# Patient Record
Sex: Female | Born: 1950
Health system: Southern US, Community
[De-identification: ages and names within clinical notes are randomized; demographics above are authoritative.]

## PROBLEM LIST (undated history)

## (undated) DIAGNOSIS — F32A Depression, unspecified: Secondary | ICD-10-CM

## (undated) DIAGNOSIS — F329 Major depressive disorder, single episode, unspecified: Secondary | ICD-10-CM

## (undated) DIAGNOSIS — G43909 Migraine, unspecified, not intractable, without status migrainosus: Secondary | ICD-10-CM

## (undated) DIAGNOSIS — R112 Nausea with vomiting, unspecified: Secondary | ICD-10-CM

## (undated) DIAGNOSIS — Z9889 Other specified postprocedural states: Secondary | ICD-10-CM

## (undated) DIAGNOSIS — G4700 Insomnia, unspecified: Secondary | ICD-10-CM

## (undated) DIAGNOSIS — K219 Gastro-esophageal reflux disease without esophagitis: Secondary | ICD-10-CM

## (undated) HISTORY — DX: Gastro-esophageal reflux disease without esophagitis: K21.9

## (undated) HISTORY — DX: Insomnia, unspecified: G47.00

## (undated) HISTORY — DX: Depression, unspecified: F32.A

---
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
## 1974-06-11 HISTORY — PX: BREAST ENHANCEMENT SURGERY: SHX7

## 1974-06-11 HISTORY — PX: AUGMENTATION MAMMAPLASTY: SUR837

## 2000-06-11 HISTORY — PX: CATARACT EXTRACTION: SUR2

## 2007-05-07 ENCOUNTER — Ambulatory Visit: Payer: Self-pay | Admitting: Emergency Medicine

## 2007-05-08 ENCOUNTER — Ambulatory Visit: Payer: Self-pay | Admitting: Emergency Medicine

## 2010-01-09 ENCOUNTER — Ambulatory Visit: Payer: Self-pay | Admitting: Internal Medicine

## 2011-02-15 ENCOUNTER — Ambulatory Visit: Payer: Self-pay | Admitting: Internal Medicine

## 2011-06-12 LAB — HM DEXA SCAN

## 2012-02-13 LAB — HM PAP SMEAR: HM PAP: NORMAL

## 2012-03-12 ENCOUNTER — Ambulatory Visit: Payer: Self-pay | Admitting: Internal Medicine

## 2012-03-13 ENCOUNTER — Ambulatory Visit: Payer: Self-pay | Admitting: Internal Medicine

## 2013-03-17 ENCOUNTER — Ambulatory Visit: Payer: Self-pay | Admitting: Internal Medicine

## 2013-12-24 ENCOUNTER — Ambulatory Visit: Payer: Self-pay | Admitting: Emergency Medicine

## 2014-04-21 LAB — TSH: TSH: 1.6 u[IU]/mL (ref <=5.90)

## 2014-04-21 LAB — BASIC METABOLIC PANEL
BUN: 11 mg/dL (ref 4–21)
Creatinine: 1 mg/dL (ref <=1.1)

## 2014-04-21 LAB — LIPID PANEL
CHOLESTEROL: 199 mg/dL (ref 0–200)
HDL: 98 mg/dL — AB (ref 35–70)
LDL CALC: 81 mg/dL
TRIGLYCERIDES: 100 mg/dL (ref 40–160)

## 2014-04-21 LAB — CBC AND DIFFERENTIAL: HEMOGLOBIN: 13.8 g/dL (ref 12.0–16.0)

## 2014-05-26 ENCOUNTER — Ambulatory Visit: Payer: Self-pay | Admitting: Internal Medicine

## 2014-06-09 ENCOUNTER — Ambulatory Visit: Payer: Self-pay | Admitting: Internal Medicine

## 2014-06-09 LAB — HM MAMMOGRAPHY: HM Mammogram: NORMAL

## 2015-02-22 ENCOUNTER — Other Ambulatory Visit: Payer: Self-pay | Admitting: Internal Medicine

## 2015-02-23 ENCOUNTER — Other Ambulatory Visit: Payer: Self-pay | Admitting: Internal Medicine

## 2015-02-23 MED ORDER — ZOLPIDEM TARTRATE 10 MG PO TABS: 10.0000 mg | ORAL_TABLET | Freq: Every evening | ORAL | Status: DC | PRN

## 2015-03-28 ENCOUNTER — Encounter: Payer: Self-pay | Admitting: Internal Medicine

## 2015-03-28 DIAGNOSIS — M810 Age-related osteoporosis without current pathological fracture: Secondary | ICD-10-CM | POA: Insufficient documentation

## 2015-03-28 DIAGNOSIS — G4700 Insomnia, unspecified: Secondary | ICD-10-CM | POA: Insufficient documentation

## 2015-03-28 DIAGNOSIS — F419 Anxiety disorder, unspecified: Secondary | ICD-10-CM | POA: Insufficient documentation

## 2015-03-28 DIAGNOSIS — F332 Major depressive disorder, recurrent severe without psychotic features: Secondary | ICD-10-CM | POA: Insufficient documentation

## 2015-03-28 DIAGNOSIS — K219 Gastro-esophageal reflux disease without esophagitis: Secondary | ICD-10-CM | POA: Insufficient documentation

## 2015-03-28 DIAGNOSIS — G43009 Migraine without aura, not intractable, without status migrainosus: Secondary | ICD-10-CM | POA: Insufficient documentation

## 2015-03-29 ENCOUNTER — Encounter: Payer: Self-pay | Admitting: Internal Medicine

## 2015-03-29 ENCOUNTER — Ambulatory Visit (INDEPENDENT_AMBULATORY_CARE_PROVIDER_SITE_OTHER): Payer: Federal, State, Local not specified - PPO | Admitting: Internal Medicine

## 2015-03-29 VITALS — BP 110/68 | HR 114 | Temp 98.3°F | Ht 67.0 in | Wt 121.6 lb

## 2015-03-29 DIAGNOSIS — J40 Bronchitis, not specified as acute or chronic: Secondary | ICD-10-CM

## 2015-03-29 MED ORDER — ALBUTEROL SULFATE HFA 108 (90 BASE) MCG/ACT IN AERS: 2.0000 | INHALATION_SPRAY | Freq: Four times a day (QID) | RESPIRATORY_TRACT | Status: DC | PRN

## 2015-03-29 MED ORDER — HYDROCODONE-HOMATROPINE 5-1.5 MG/5ML PO SYRP: 5.0000 mL | ORAL_SOLUTION | Freq: Four times a day (QID) | ORAL | Status: DC | PRN

## 2015-03-29 MED ORDER — CEFDINIR 300 MG PO CAPS: 300.0000 mg | ORAL_CAPSULE | Freq: Two times a day (BID) | ORAL | Status: DC

## 2015-03-29 NOTE — Progress Notes (Signed)
Date:  03/29/2015   Name:  Carolyn Dunlap   DOB:  Jun 21, 1950   MRN:  [id number]   Chief Complaint: Bronchitis Patient started a week ago with chest tightness and a dry cough. This progressed to sinus congestion and postnasal drainage with production of thick yellow mucus. She may have had low-grade fever. Some mild wheezing. She is taking an over-the-counter cough syrup and a multisymptom cold reliever. She denies headache or dizziness although she has chronic tinnitus. She's had no vomiting diarrhea or vertigo.   Review of Systems  Constitutional: Positive for fever and fatigue. Negative for diaphoresis.  HENT: Positive for congestion, postnasal drip, rhinorrhea, sinus pressure and tinnitus. Negative for ear pain, nosebleeds, sneezing and sore throat.   Respiratory: Positive for cough, chest tightness, shortness of breath and wheezing.   Cardiovascular: Negative for chest pain and palpitations.  Gastrointestinal: Negative for vomiting, abdominal pain and diarrhea.  Neurological: Negative for light-headedness and headaches.    Patient Active Problem List   Diagnosis Date Noted  . Migraine without aura and without status migrainosus, not intractable 03/28/2015  . GERD without esophagitis 03/28/2015  . Osteoporosis 03/28/2015  . Insomnia 03/28/2015  . Major depression in partial remission ([place]) 03/28/2015  . Anxiety disorder 03/28/2015    Prior to Admission medications   Medication Sig Start Date End Date Taking? Authorizing Provider  escitalopram (LEXAPRO) 20 MG tablet TAKE 1 TABLET DAILY 02/22/15  Yes Glean Hess, MD  SUMAtriptan (IMITREX) 100 MG tablet TAKE 1 TABLET DAILY AS     DIRECTED BY YOUR DOCTOR 02/22/15  Yes Glean Hess, MD  traZODone (DESYREL) 50 MG tablet TAKE 1 TABLET AT BEDTIME 02/22/15  Yes Glean Hess, MD  zolpidem (AMBIEN) 10 MG tablet Take 1 tablet (10 mg total) by mouth at bedtime as needed for sleep. 02/23/15 03/25/15  Glean Hess, MD    Allergies   Allergen Reactions  . Zoloft [Sertraline Hcl]     diaphoresis  . Penicillins Rash    Past Surgical History  Procedure Laterality Date  . Cataract extraction  2002  . Breast enhancement surgery  1976  . Oophorectomy  1974    Social History  Substance Use Topics  . Smoking status: Current Every Day Smoker  . Smokeless tobacco: None  . Alcohol Use: 1.2 oz/week    2 Standard drinks or equivalent per week    Medication list has been reviewed and updated.   Physical Exam  Constitutional: She appears well-developed. She has a sickly appearance.  HENT:  Right Ear: Ear canal normal. Tympanic membrane is scarred.  Left Ear: Ear canal normal. Tympanic membrane is scarred.  Nose: Right sinus exhibits maxillary sinus tenderness. Right sinus exhibits no frontal sinus tenderness. Left sinus exhibits maxillary sinus tenderness. Left sinus exhibits no frontal sinus tenderness.  Mouth/Throat: Posterior oropharyngeal erythema present. No oropharyngeal exudate or posterior oropharyngeal edema.  Cardiovascular: Normal rate, regular rhythm and normal heart sounds.   Pulmonary/Chest: No accessory muscle usage. No respiratory distress. She has wheezes in the right upper field, the right lower field and the left lower field. She has no rhonchi.  Nursing note and vitals reviewed.   BP 110/68 mmHg  Pulse 114  Temp(Src) 98.3 F (36.8 C)  Ht 5\' 7"  (1.702 m)  Wt 121 lb 9.6 oz (55.157 kg)  BMI 19.04 kg/m2  SpO2 95%  Assessment and Plan: 1. Bronchitis Patient to begin Claritin or Allegra daily Continue Robitussin cough syrup - HYDROcodone-homatropine (HYCODAN) 5-1.5 MG/5ML  syrup; Take 5 mLs by mouth every 6 (six) hours as needed for cough.  Dispense: 120 mL; Refill: 0 - cefdinir (OMNICEF) 300 MG capsule; Take 1 capsule (300 mg total) by mouth 2 (two) times daily.  Dispense: 20 capsule; Refill: 0 - albuterol (PROVENTIL HFA;VENTOLIN HFA) 108 (90 BASE) MCG/ACT inhaler; Inhale 2 puffs into the lungs  every 6 (six) hours as needed for wheezing or shortness of breath.  Dispense: 1 Inhaler; Refill: 0   Halina Maidens, MD [facility]  03/29/2015

## 2015-03-29 NOTE — Patient Instructions (Signed)
Take Allegra or Claritin for post nasal drainage  Robitussin DM - cough syrup and expectorant  Use pro-air inhaler - 2 puffs every 4 hours if needed for chest tightness/wheezing

## 2015-05-26 ENCOUNTER — Ambulatory Visit (INDEPENDENT_AMBULATORY_CARE_PROVIDER_SITE_OTHER): Payer: Federal, State, Local not specified - PPO | Admitting: Internal Medicine

## 2015-05-26 ENCOUNTER — Encounter: Payer: Self-pay | Admitting: Internal Medicine

## 2015-05-26 VITALS — BP 128/80 | HR 88 | Ht 67.0 in | Wt 122.8 lb

## 2015-05-26 DIAGNOSIS — Z0000 Encounter for general adult medical examination without abnormal findings: Secondary | ICD-10-CM

## 2015-05-26 DIAGNOSIS — G43009 Migraine without aura, not intractable, without status migrainosus: Secondary | ICD-10-CM

## 2015-05-26 DIAGNOSIS — F324 Major depressive disorder, single episode, in partial remission: Secondary | ICD-10-CM | POA: Diagnosis not present

## 2015-05-26 DIAGNOSIS — Z1239 Encounter for other screening for malignant neoplasm of breast: Secondary | ICD-10-CM

## 2015-05-26 DIAGNOSIS — K219 Gastro-esophageal reflux disease without esophagitis: Secondary | ICD-10-CM | POA: Diagnosis not present

## 2015-05-26 DIAGNOSIS — M810 Age-related osteoporosis without current pathological fracture: Secondary | ICD-10-CM | POA: Diagnosis not present

## 2015-05-26 DIAGNOSIS — Z23 Encounter for immunization: Secondary | ICD-10-CM

## 2015-05-26 DIAGNOSIS — Z124 Encounter for screening for malignant neoplasm of cervix: Secondary | ICD-10-CM | POA: Diagnosis not present

## 2015-05-26 LAB — POCT URINALYSIS DIPSTICK
BILIRUBIN UA: NEGATIVE
Blood, UA: NEGATIVE
GLUCOSE UA: NEGATIVE
KETONES UA: NEGATIVE
LEUKOCYTES UA: NEGATIVE
Nitrite, UA: NEGATIVE
PH UA: 5
Protein, UA: NEGATIVE
Spec Grav, UA: 1.01
Urobilinogen, UA: 0.2

## 2015-05-26 MED ORDER — ESCITALOPRAM OXALATE 20 MG PO TABS: 30.0000 mg | ORAL_TABLET | Freq: Every day | ORAL | Status: DC

## 2015-05-26 MED ORDER — ZOLPIDEM TARTRATE 10 MG PO TABS: 10.0000 mg | ORAL_TABLET | Freq: Every evening | ORAL | Status: DC | PRN

## 2015-05-26 NOTE — Patient Instructions (Addendum)
Pneumococcal Conjugate Vaccine (PCV13)  1. Why get vaccinated? Vaccination can protect both children and adults from pneumococcal disease. Pneumococcal disease is caused by bacteria that can spread from person to person through close contact. It can cause ear infections, and it can also lead to more serious infections of the:  Lungs (pneumonia),  Blood (bacteremia), and  Covering of the brain and spinal cord (meningitis). Pneumococcal pneumonia is most common among adults. Pneumococcal meningitis can cause deafness and brain damage, and it kills about 1 child in 10 who get it. Anyone can get pneumococcal disease, but children under 28 years of age and adults 43 years and older, people with certain medical conditions, and cigarette smokers are at the highest risk. Before there was a vaccine, the [place] saw:  more than 700 cases of meningitis,  about 13,000 blood infections,  about 5 million ear infections, and  about 200 deaths in children under 5 each year from pneumococcal disease. Since vaccine became available, severe pneumococcal disease in these children has fallen by 88%. About 18,000 older adults die of pneumococcal disease each year in the [place]. Treatment of pneumococcal infections with penicillin and other drugs is not as effective as it used to be, because some strains of the disease have become resistant to these drugs. This makes prevention of the disease, through vaccination, even more important. 2. PCV13 vaccine Pneumococcal conjugate vaccine (called PCV13) protects against 13 types of pneumococcal bacteria. PCV13 is routinely given to children at 2, 4, 6, and 65-74 months of age. It is also recommended for children and adults 70 to 70 years of age with certain health conditions, and for all adults 64 years of age and older. Your doctor can give you details. 3. Some people should not get this vaccine Anyone who has ever had a life-threatening allergic reaction  to a dose of this vaccine, to an earlier pneumococcal vaccine called PCV7, or to any vaccine containing diphtheria toxoid (for example, DTaP), should not get PCV13. Anyone with a severe allergy to any component of PCV13 should not get the vaccine. Tell your doctor if the person being vaccinated has any severe allergies. If the person scheduled for vaccination is not feeling well, your healthcare provider might decide to reschedule the shot on another day. 4. Risks of a vaccine reaction With any medicine, including vaccines, there is a chance of reactions. These are usually mild and go away on their own, but serious reactions are also possible. Problems reported following PCV13 varied by age and dose in the series. The most common problems reported among children were:  About half became drowsy after the shot, had a temporary loss of appetite, or had redness or tenderness where the shot was given.  About 1 out of 3 had swelling where the shot was given.  About 1 out of 3 had a mild fever, and about 1 in 20 had a fever over 102.55F.  Up to about 8 out of 10 became fussy or irritable. Adults have reported pain, redness, and swelling where the shot was given; also mild fever, fatigue, headache, chills, or muscle pain. Young children who get PCV13 along with inactivated flu vaccine at the same time may be at increased risk for seizures caused by fever. Ask your doctor for more information. Problems that could happen after any vaccine:  People sometimes faint after a medical procedure, including vaccination. Sitting or lying down for about 15 minutes can help prevent fainting, and injuries caused by a fall.  Tell your doctor if you feel dizzy, or have vision changes or ringing in the ears.  Some older children and adults get severe pain in the shoulder and have difficulty moving the arm where a shot was given. This happens very rarely.  Any medication can cause a severe allergic reaction. Such  reactions from a vaccine are very rare, estimated at about 1 in a million doses, and would happen within a few minutes to a few hours after the vaccination. As with any medicine, there is a very small chance of a vaccine causing a serious injury or death. The safety of vaccines is always being monitored. For more information, visit: http://www.aguilar.org/ 5. What if there is a serious reaction? What should I look for?  Look for anything that concerns you, such as signs of a severe allergic reaction, very high fever, or unusual behavior. Signs of a severe allergic reaction can include hives, swelling of the face and throat, difficulty breathing, a fast heartbeat, dizziness, and weakness-usually within a few minutes to a few hours after the vaccination. What should I do?  If you think it is a severe allergic reaction or other emergency that can't wait, call 9-1-1 or get the person to the nearest hospital. Otherwise, call your doctor. Reactions should be reported to the Vaccine Adverse Event Reporting System (VAERS). Your doctor should file this report, or you can do it yourself through the VAERS web site at www.vaers.SamedayNews.es, or by calling 249-618-7204. VAERS does not give medical advice. 6. The National Vaccine Injury Compensation Program The [employer] Vaccine Injury Compensation Program (VICP) is a federal program that was created to compensate people who may have been injured by certain vaccines. Persons who believe they may have been injured by a vaccine can learn about the program and about filing a claim by calling (425)392-6800 or visiting the [place] website at GoldCloset.com.ee. There is a time limit to file a claim for compensation. 7. How can I learn more?  Ask your healthcare provider. He or she can give you the vaccine package insert or suggest other sources of information.  Call your local or state health department.  Contact the Centers for Disease Control and  Prevention (CDC):  Call (606)180-8535 (1-800-CDC-INFO) or Visit CDC's website at https://stone.com/ Self-Awareness Practicing breast self-awareness may pick up problems early, prevent significant medical complications, and possibly save your life. By practicing breast self-awareness, you can become familiar with how your breasts look and feel and if your breasts are changing. This allows you to notice changes early. It can also offer you some reassurance that your breast health is good. One way to learn what is normal for your breasts and whether your breasts are changing is to do a breast self-exam. If you find a lump or something that was not present in the past, it is best to contact your caregiver right away. Other findings that should be evaluated by your caregiver include nipple discharge, especially if it is bloody; skin changes or reddening; areas where the skin seems to be pulled in (retracted); or new lumps and bumps. Breast pain is seldom associated with cancer (malignancy), but should also be evaluated by a caregiver. HOW TO PERFORM A BREAST SELF-EXAM The best time to examine your breasts is 5-7 days after your menstrual period is over. During menstruation, the breasts are lumpier, and it may be more difficult to pick up changes. If you do not menstruate, have reached menopause, or had your uterus removed (hysterectomy), you should examine your  breasts at regular intervals, such as monthly. If you are breastfeeding, examine your breasts after a feeding or after using a breast pump. Breast implants do not decrease the risk for lumps or tumors, so continue to perform breast self-exams as recommended. Talk to your caregiver about how to determine the difference between the implant and breast tissue. Also, talk about the amount of pressure you should use during the exam. Over time, you will become more familiar with the variations of your breasts and more comfortable with the exam. A breast  self-exam requires you to remove all your clothes above the waist. Look at your breasts and nipples. Stand in front of a mirror in a room with good lighting. With your hands on your hips, push your hands firmly downward. Look for a difference in shape, contour, and size from one breast to the other (asymmetry). Asymmetry includes puckers, dips, or bumps. Also, look for skin changes, such as reddened or scaly areas on the breasts. Look for nipple changes, such as discharge, dimpling, repositioning, or redness. Carefully feel your breasts. This is best done either in the shower or tub while using soapy water or when flat on your back. Place the arm (on the side of the breast you are examining) above your head. Use the pads (not the fingertips) of your three middle fingers on your opposite hand to feel your breasts. Start in the underarm area and use  inch (2 cm) overlapping circles to feel your breast. Use 3 different levels of pressure (light, medium, and firm pressure) at each circle before moving to the next circle. The light pressure is needed to feel the tissue closest to the skin. The medium pressure will help to feel breast tissue a little deeper, while the firm pressure is needed to feel the tissue close to the ribs. Continue the overlapping circles, moving downward over the breast until you feel your ribs below your breast. Then, move one finger-width towards the center of the body. Continue to use the  inch (2 cm) overlapping circles to feel your breast as you move slowly up toward the collar bone (clavicle) near the base of the neck. Continue the up and down exam using all 3 pressures until you reach the middle of the chest. Do this with each breast, carefully feeling for lumps or changes.  Keep a written record with breast changes or normal findings for each breast. By writing this information down, you do not need to depend only on memory for size, tenderness, or location. Write down where you are  in your menstrual cycle, if you are still menstruating. Breast tissue can have some lumps or thick tissue. However, see your caregiver if you find anything that concerns you.  SEEK MEDICAL CARE IF: You see a change in shape, contour, or size of your breasts or nipples.  You see skin changes, such as reddened or scaly areas on the breasts or nipples.  You have an unusual discharge from your nipples.  You feel a new lump or unusually thick areas.    This information is not intended to replace advice given to you by your health care provider. Make sure you discuss any questions you have with your health care provider.   Document Released: 05/28/2005 Document Revised: 05/14/2012 Document Reviewed: 09/12/2011 Elsevier Interactive Patient Education 2016 [place] Statement PCV13 Vaccine (04/15/2014)   This information is not intended to replace advice given to you by your health care provider. Make sure  you discuss any questions you have with your health care provider.   Document Released: 03/25/2006 Document Revised: 06/18/2014 Document Reviewed: 04/22/2014 Elsevier Interactive Patient Education [employer].

## 2015-05-26 NOTE — Progress Notes (Signed)
Date:  05/26/2015   Name:  Carolyn Dunlap   DOB:  1950-08-13   MRN:  [id number]   Chief Complaint: Annual Exam Depression        This is a chronic problem.  The problem occurs intermittently.  The problem has been waxing and waning since onset.  Associated symptoms include hopelessness (related to issues with her adult son), insomnia, decreased interest and headaches.  Associated symptoms include no fatigue and no helplessness.  Past treatments include SSRIs - Selective serotonin reuptake inhibitors.  Compliance with treatment is good.  Previous treatment provided moderate relief. Migraine  This is a recurrent problem. The current episode started more than 1 year ago. The problem has been waxing and waning. Associated symptoms include insomnia. Pertinent negatives include no abdominal pain, coughing, dizziness or fever.  Carolyn Dunlap is a 64 y.o. female who presents today for her Complete Annual Exam. She feels well. She reports exercising walking. She reports she is sleeping well. She has cut back smoking to 1/4 ppd. She denies breast problems but is due for mammogram. Her last Pap smear was 3 years ago.   Review of Systems  Constitutional: Negative for fever, chills and fatigue.  Eyes: Negative for visual disturbance.  Respiratory: Negative for cough, chest tightness, shortness of breath and wheezing.   Cardiovascular: Negative for chest pain, palpitations and leg swelling.  Gastrointestinal: Negative for abdominal pain and blood in stool.  Genitourinary: Negative for dysuria, hematuria, decreased urine volume, vaginal bleeding and vaginal discharge.  Musculoskeletal: Negative for arthralgias and gait problem.  Skin: Negative for rash.  Neurological: Positive for headaches. Negative for dizziness, tremors, syncope and light-headedness.  Hematological: Negative for adenopathy.  Psychiatric/Behavioral: Positive for depression, sleep disturbance and dysphoric mood. Negative for confusion.  The patient is nervous/anxious and has insomnia.     Patient Active Problem List   Diagnosis Date Noted  . Migraine without aura and without status migrainosus, not intractable 03/28/2015  . GERD without esophagitis 03/28/2015  . Osteoporosis 03/28/2015  . Insomnia 03/28/2015  . Major depression in partial remission ([place]) 03/28/2015  . Anxiety disorder 03/28/2015    Prior to Admission medications   Medication Sig Start Date End Date Taking? Authorizing Provider  escitalopram (LEXAPRO) 20 MG tablet TAKE 1 TABLET DAILY 02/22/15  Yes Glean Hess, MD  SUMAtriptan (IMITREX) 100 MG tablet TAKE 1 TABLET DAILY AS     DIRECTED BY YOUR DOCTOR 02/22/15  Yes Glean Hess, MD  traZODone (DESYREL) 50 MG tablet TAKE 1 TABLET AT BEDTIME 02/22/15  Yes Glean Hess, MD  zolpidem (AMBIEN) 10 MG tablet Take 1 tablet (10 mg total) by mouth at bedtime as needed for sleep. 02/23/15 03/25/15  Glean Hess, MD    Allergies  Allergen Reactions  . Zoloft [Sertraline Hcl]     diaphoresis  . Penicillins Rash    Past Surgical History  Procedure Laterality Date  . Cataract extraction  2002  . Breast enhancement surgery  1976  . Oophorectomy  1974    Social History  Substance Use Topics  . Smoking status: Current Every Day Smoker  . Smokeless tobacco: None  . Alcohol Use: 1.2 oz/week    2 Standard drinks or equivalent per week    Medication list has been reviewed and updated.   Physical Exam  Constitutional: She is oriented to person, place, and time. She appears well-developed and well-nourished. No distress.  HENT:  Head: Normocephalic and atraumatic.  Right Ear: Tympanic membrane  and ear canal normal.  Left Ear: Tympanic membrane and ear canal normal.  Nose: Right sinus exhibits no maxillary sinus tenderness. Left sinus exhibits no maxillary sinus tenderness.  Mouth/Throat: Uvula is midline and oropharynx is clear and moist.  Eyes: Conjunctivae and EOM are normal. Right eye  exhibits no discharge. Left eye exhibits no discharge. No scleral icterus.  Neck: Normal range of motion. Carotid bruit is not present. No erythema present. No thyromegaly present.  Cardiovascular: Normal rate, regular rhythm, normal heart sounds and normal pulses.   Pulmonary/Chest: Effort normal. No respiratory distress. She has no wheezes. Right breast exhibits no mass, no nipple discharge, no skin change and no tenderness. Left breast exhibits no mass, no nipple discharge, no skin change and no tenderness.  Bilateral breast implants - firm, left implant shifted laterally; no tenderness  Abdominal: Soft. Bowel sounds are normal. There is no hepatosplenomegaly. There is no tenderness. There is no CVA tenderness.  Genitourinary: Rectum normal, vagina normal and uterus normal. There is no rash, tenderness or lesion on the right labia. There is no rash, tenderness or lesion on the left labia. Cervix exhibits no motion tenderness, no discharge and no friability. Right adnexum displays no mass, no tenderness and no fullness. Left adnexum displays no mass, no tenderness and no fullness.  Musculoskeletal: Normal range of motion.  Lymphadenopathy:    She has no cervical adenopathy.    She has no axillary adenopathy.  Neurological: She is alert and oriented to person, place, and time. She has normal reflexes. No cranial nerve deficit or sensory deficit.  Skin: Skin is warm, dry and intact. No rash noted.  Psychiatric: Her speech is normal and behavior is normal. Thought content normal. She exhibits a depressed mood.  Nursing note and vitals reviewed.   BP 128/80 mmHg  Pulse 88  Ht 5\' 7"  (1.702 m)  Wt 122 lb 12.8 oz (55.702 kg)  BMI 19.23 kg/m2  Assessment and Plan: 1. Annual physical exam Tobacco cessation discussed - pt is cutting back with a goal to eventually quit completely - POCT urinalysis dipstick - Hepatitis C antibody - HIV antibody - Lipid panel - TSH  2. Breast cancer screening -  MM DIGITAL SCREENING BILATERAL; Future  3. Pap smear for cervical cancer screening - Pap IG and HPV (high risk) DNA detection  4. Migraine without aura and without status migrainosus, not intractable Continue Excedrin migraine or Imitrex for more severe headache - Comprehensive metabolic panel  5. Major depressive disorder with single episode, in partial remission (HCC) Will increase dose on Lexapro to 30 mg Consider adding Welbutrin if needed - escitalopram (LEXAPRO) 20 MG tablet; Take 1.5 tablets (30 mg total) by mouth daily.  Dispense: 135 tablet; Refill: 3 - TSH  6. GERD without esophagitis stable - CBC with Differential/Platelet  7. Need for pneumococcal vaccination Due to long term smoking history - Pneumococcal conjugate vaccine 13-valent IM  8. Osteoporosis On Bisphosphonate holiday for the past year - DG Bone Density; Future   Halina Maidens, MD [facility]  05/26/2015

## 2015-05-27 LAB — COMPREHENSIVE METABOLIC PANEL
ALT: 15 IU/L (ref 0–32)
AST: 20 IU/L (ref 0–40)
Albumin/Globulin Ratio: 1.6 (ref 1.1–2.5)
Albumin: 4.3 g/dL (ref 3.6–4.8)
Alkaline Phosphatase: 95 IU/L (ref 39–117)
BUN/Creatinine Ratio: 11 (ref 11–26)
BUN: 10 mg/dL (ref 8–27)
Bilirubin Total: 0.4 mg/dL (ref 0.0–1.2)
CALCIUM: 9.3 mg/dL (ref 8.7–10.3)
CO2: 23 mmol/L (ref 18–29)
CREATININE: 0.89 mg/dL (ref 0.57–1.00)
Chloride: 95 mmol/L — ABNORMAL LOW (ref 96–106)
GFR, EST AFRICAN AMERICAN: 80 mL/min/{1.73_m2} (ref >59)
GFR, EST NON AFRICAN AMERICAN: 70 mL/min/{1.73_m2} (ref >59)
GLOBULIN, TOTAL: 2.7 g/dL (ref 1.5–4.5)
Glucose: 106 mg/dL — ABNORMAL HIGH (ref 65–99)
Potassium: 4.3 mmol/L (ref 3.5–5.2)
Sodium: 135 mmol/L (ref 134–144)
TOTAL PROTEIN: 7 g/dL (ref 6.0–8.5)

## 2015-05-27 LAB — CBC WITH DIFFERENTIAL/PLATELET
BASOS: 0 %
Basophils Absolute: 0 10*3/uL (ref 0.0–0.2)
EOS (ABSOLUTE): 0.1 10*3/uL (ref 0.0–0.4)
EOS: 0 %
HEMATOCRIT: 38.7 % (ref 34.0–46.6)
HEMOGLOBIN: 12.9 g/dL (ref 11.1–15.9)
IMMATURE GRANS (ABS): 0 10*3/uL (ref 0.0–0.1)
IMMATURE GRANULOCYTES: 0 %
LYMPHS: 8 %
Lymphocytes Absolute: 0.9 10*3/uL (ref 0.7–3.1)
MCH: 30.6 pg (ref 26.6–33.0)
MCHC: 33.3 g/dL (ref 31.5–35.7)
MCV: 92 fL (ref 79–97)
Monocytes Absolute: 0.5 10*3/uL (ref 0.1–0.9)
Monocytes: 4 %
NEUTROS ABS: 9.7 10*3/uL — AB (ref 1.4–7.0)
NEUTROS PCT: 88 %
Platelets: 295 10*3/uL (ref 150–379)
RBC: 4.21 x10E6/uL (ref 3.77–5.28)
RDW: 15.4 % (ref 12.3–15.4)
WBC: 11.3 10*3/uL — ABNORMAL HIGH (ref 3.4–10.8)

## 2015-05-27 LAB — LIPID PANEL
CHOL/HDL RATIO: 2.3 ratio (ref 0.0–4.4)
CHOLESTEROL TOTAL: 181 mg/dL (ref 100–199)
HDL: 79 mg/dL (ref >39)
LDL CALC: 82 mg/dL (ref 0–99)
TRIGLYCERIDES: 99 mg/dL (ref 0–149)
VLDL CHOLESTEROL CAL: 20 mg/dL (ref 5–40)

## 2015-05-27 LAB — HIV ANTIBODY (ROUTINE TESTING W REFLEX): HIV Screen 4th Generation wRfx: NONREACTIVE

## 2015-05-27 LAB — HEPATITIS C ANTIBODY: Hep C Virus Ab: <0.1 s/co ratio (ref 0.0–0.9)

## 2015-05-27 LAB — TSH: TSH: 1.78 u[IU]/mL (ref 0.450–4.500)

## 2015-06-02 ENCOUNTER — Ambulatory Visit
Admission: RE | Admit: 2015-06-02 | Discharge: 2015-06-02 | Disposition: A | Discharge status: Home / Self Care | Payer: Federal, State, Local not specified - PPO | Source: Ambulatory Visit | Attending: Internal Medicine | Admitting: Internal Medicine

## 2015-06-02 ENCOUNTER — Other Ambulatory Visit: Payer: Self-pay | Admitting: Internal Medicine

## 2015-06-02 DIAGNOSIS — Z1239 Encounter for other screening for malignant neoplasm of breast: Secondary | ICD-10-CM

## 2015-06-02 DIAGNOSIS — Z1231 Encounter for screening mammogram for malignant neoplasm of breast: Secondary | ICD-10-CM | POA: Insufficient documentation

## 2015-06-02 DIAGNOSIS — M810 Age-related osteoporosis without current pathological fracture: Secondary | ICD-10-CM | POA: Diagnosis present

## 2015-06-03 LAB — PAP IG AND HPV HIGH-RISK
HPV, HIGH-RISK: NEGATIVE
PAP SMEAR COMMENT: 0

## 2015-06-23 LAB — SPECIMEN STATUS REPORT

## 2015-06-23 LAB — HGB A1C W/O EAG: HEMOGLOBIN A1C: 5.4 % (ref 4.8–5.6)

## 2015-07-01 ENCOUNTER — Encounter: Payer: Self-pay | Admitting: Internal Medicine

## 2015-07-01 ENCOUNTER — Ambulatory Visit (INDEPENDENT_AMBULATORY_CARE_PROVIDER_SITE_OTHER): Payer: Federal, State, Local not specified - PPO | Admitting: Internal Medicine

## 2015-07-01 VITALS — BP 132/80 | HR 96 | Temp 98.1°F | Resp 16 | Ht 66.5 in | Wt 119.0 lb

## 2015-07-01 DIAGNOSIS — D72829 Elevated white blood cell count, unspecified: Secondary | ICD-10-CM

## 2015-07-01 NOTE — Progress Notes (Signed)
    Date:  07/01/2015   Name:  Carolyn Dunlap   DOB:  Nov 16, 1950   MRN:  [id number]   Chief Complaint: Depression Depression           Review of Systems  Psychiatric/Behavioral: Positive for depression.    Patient Active Problem List   Diagnosis Date Noted  . Migraine without aura and without status migrainosus, not intractable 03/28/2015  . GERD without esophagitis 03/28/2015  . Osteoporosis 03/28/2015  . Insomnia 03/28/2015  . Major depression in partial remission ([place]) 03/28/2015  . Anxiety disorder 03/28/2015    Prior to Admission medications   Medication Sig Start Date End Date Taking? Authorizing Provider  escitalopram (LEXAPRO) 20 MG tablet Take 1.5 tablets (30 mg total) by mouth daily. 05/26/15  Yes Glean Hess, MD  SUMAtriptan (IMITREX) 100 MG tablet TAKE 1 TABLET DAILY AS     DIRECTED BY YOUR DOCTOR 02/22/15  Yes Glean Hess, MD  traZODone (DESYREL) 50 MG tablet TAKE 1 TABLET AT BEDTIME 02/22/15  Yes Glean Hess, MD  zolpidem (AMBIEN) 10 MG tablet Take 1 tablet (10 mg total) by mouth at bedtime as needed for sleep. 05/26/15 06/25/15  Glean Hess, MD    Allergies  Allergen Reactions  . Zoloft [Sertraline Hcl]     diaphoresis  . Penicillins Rash    Past Surgical History  Procedure Laterality Date  . Cataract extraction  2002  . Breast enhancement surgery  1976  . Oophorectomy  1974  . Augmentation mammaplasty Bilateral 1976    Social History  Substance Use Topics  . Smoking status: Current Every Day Smoker  . Smokeless tobacco: None  . Alcohol Use: 1.2 oz/week    2 Standard drinks or equivalent per week     Medication list has been reviewed and updated.   Physical Exam  BP 132/80 mmHg  Pulse 96  Temp(Src) 98.1 F (36.7 C)  Resp 16  Ht 5' 6.5" (1.689 m)  Wt 119 lb (53.978 kg)  BMI 18.92 kg/m2  Assessment and Plan:

## 2015-07-02 LAB — CBC WITH DIFFERENTIAL/PLATELET
BASOS ABS: 0 10*3/uL (ref 0.0–0.2)
Basos: 0 %
EOS (ABSOLUTE): 0.1 10*3/uL (ref 0.0–0.4)
Eos: 1 %
HEMOGLOBIN: 14.1 g/dL (ref 11.1–15.9)
Hematocrit: 43.6 % (ref 34.0–46.6)
Immature Grans (Abs): 0 10*3/uL (ref 0.0–0.1)
Immature Granulocytes: 0 %
LYMPHS ABS: 1.5 10*3/uL (ref 0.7–3.1)
Lymphs: 13 %
MCH: 31.1 pg (ref 26.6–33.0)
MCHC: 32.3 g/dL (ref 31.5–35.7)
MCV: 96 fL (ref 79–97)
MONOCYTES: 4 %
Monocytes Absolute: 0.4 10*3/uL (ref 0.1–0.9)
NEUTROS ABS: 9.6 10*3/uL — AB (ref 1.4–7.0)
Neutrophils: 82 %
Platelets: 304 10*3/uL (ref 150–379)
RBC: 4.54 x10E6/uL (ref 3.77–5.28)
RDW: 14.9 % (ref 12.3–15.4)
WBC: 11.6 10*3/uL — ABNORMAL HIGH (ref 3.4–10.8)

## 2015-07-04 ENCOUNTER — Telehealth: Payer: Self-pay

## 2015-07-04 NOTE — Telephone Encounter (Signed)
-----   Message from Glean Hess, MD sent at 07/03/2015  7:00 PM EST ----- WBC remains slightly elevated - does she have any symptoms of an infection such as sinusitis or any recent use of prednisone?

## 2015-07-04 NOTE — Telephone Encounter (Signed)
Spoke with patient. Patient denies having any signs of infection. Patient advised to come by and pick up a lab slip in 2 months. [facility]

## 2015-08-09 ENCOUNTER — Ambulatory Visit
Admission: RE | Admit: 2015-08-09 | Discharge: 2015-08-09 | Disposition: A | Discharge status: Home / Self Care | Payer: Federal, State, Local not specified - PPO | Source: Ambulatory Visit | Attending: Internal Medicine | Admitting: Internal Medicine

## 2015-08-09 ENCOUNTER — Encounter: Payer: Self-pay | Admitting: Internal Medicine

## 2015-08-09 ENCOUNTER — Ambulatory Visit (INDEPENDENT_AMBULATORY_CARE_PROVIDER_SITE_OTHER): Payer: Federal, State, Local not specified - PPO | Admitting: Internal Medicine

## 2015-08-09 VITALS — BP 96/60 | HR 65 | Temp 98.3°F | Ht 66.5 in | Wt 129.1 lb

## 2015-08-09 DIAGNOSIS — J40 Bronchitis, not specified as acute or chronic: Secondary | ICD-10-CM | POA: Diagnosis not present

## 2015-08-09 DIAGNOSIS — R918 Other nonspecific abnormal finding of lung field: Secondary | ICD-10-CM | POA: Insufficient documentation

## 2015-08-09 MED ORDER — LEVOFLOXACIN 500 MG PO TABS: 500.0000 mg | ORAL_TABLET | Freq: Every day | ORAL | Status: DC

## 2015-08-09 NOTE — Progress Notes (Signed)
Date:  08/09/2015   Name:  Carolyn Dunlap   DOB:  13-May-1951   MRN:  [id number]   Chief Complaint: Cough Cough This is a new problem. The current episode started 1 to 4 weeks ago. The problem has been unchanged. The problem occurs hourly. The cough is non-productive. Associated symptoms include chest pain, shortness of breath and wheezing. Pertinent negatives include no chills, fever, headaches, myalgias, nasal congestion, sore throat or sweats. Risk factors for lung disease include smoking/tobacco exposure. She has tried a beta-agonist inhaler for the symptoms. The treatment provided mild relief.     Review of Systems  Constitutional: Negative for fever and chills.  HENT: Positive for congestion and sinus pressure. Negative for sore throat.   Respiratory: Positive for cough, shortness of breath and wheezing.   Cardiovascular: Positive for chest pain.  Gastrointestinal: Negative for nausea and diarrhea.  Musculoskeletal: Positive for arthralgias (chest wall discomfort). Negative for myalgias.  Neurological: Negative for headaches.  Hematological: Negative for adenopathy. Does not bruise/bleed easily.    Patient Active Problem List   Diagnosis Date Noted  . Migraine without aura and without status migrainosus, not intractable 03/28/2015  . GERD without esophagitis 03/28/2015  . Osteoporosis 03/28/2015  . Insomnia 03/28/2015  . Major depression in partial remission ([place]) 03/28/2015  . Anxiety disorder 03/28/2015    Prior to Admission medications   Medication Sig Start Date End Date Taking? Authorizing Provider  escitalopram (LEXAPRO) 20 MG tablet Take 1.5 tablets (30 mg total) by mouth daily. 05/26/15  Yes Glean Hess, MD  SUMAtriptan (IMITREX) 100 MG tablet TAKE 1 TABLET DAILY AS     DIRECTED BY YOUR DOCTOR 02/22/15  Yes Glean Hess, MD  traZODone (DESYREL) 50 MG tablet TAKE 1 TABLET AT BEDTIME 02/22/15  Yes Glean Hess, MD  zolpidem (AMBIEN) 10 MG tablet Take 1  tablet by mouth at bedtime. 08/02/15  Yes Historical Provider, MD    Allergies  Allergen Reactions  . Zoloft [Sertraline Hcl]     diaphoresis  . Penicillins Rash    Past Surgical History  Procedure Laterality Date  . Cataract extraction  2002  . Breast enhancement surgery  1976  . Oophorectomy  1974  . Augmentation mammaplasty Bilateral 1976    Social History  Substance Use Topics  . Smoking status: Current Some Day Smoker  . Smokeless tobacco: None  . Alcohol Use: 1.2 oz/week    2 Standard drinks or equivalent per week     Medication list has been reviewed and updated.   Physical Exam  Constitutional: She is oriented to person, place, and time. She appears well-developed. She has a sickly appearance. No distress.  HENT:  Head: Normocephalic and atraumatic.  Neck: No tracheal tenderness present. Carotid bruit is not present.  Cardiovascular: Normal rate, regular rhythm and normal heart sounds.   Pulmonary/Chest: Effort normal. No respiratory distress. She has decreased breath sounds. She has wheezes. Right breast exhibits no mass, no nipple discharge, no skin change and no tenderness. Left breast exhibits no mass, no nipple discharge, no skin change and no tenderness.    Bilateral breast implants firm  Musculoskeletal: Normal range of motion.  Neurological: She is alert and oriented to person, place, and time.  Skin: Skin is warm and dry. No rash noted.  Psychiatric: She has a normal mood and affect. Her behavior is normal. Thought content normal.  Nursing note and vitals reviewed.   BP 96/60 mmHg  Pulse 65  Temp(Src)  98.3 F (36.8 C) (Oral)  Ht 5' 6.5" (1.689 m)  Wt 129 lb 1.6 oz (58.559 kg)  BMI 20.53 kg/m2  SpO2 97%  Assessment and Plan: 1. Bronchitis Continue Robitussin for cough Take tylenol and use warm compress for chest wall discomfort - DG Chest 2 View; Future - levofloxacin (LEVAQUIN) 500 MG tablet; Take 1 tablet (500 mg total) by mouth daily.   Dispense: 7 tablet; Refill: 0   Halina Maidens, MD [facility]  08/09/2015

## 2015-08-09 NOTE — Patient Instructions (Signed)

## 2015-08-12 ENCOUNTER — Telehealth: Payer: Self-pay

## 2015-08-12 NOTE — Telephone Encounter (Signed)
Spoke with patient. Patient advised of all results and verbalized understanding. Will call back with any future questions or concerns. [facility]  

## 2015-08-12 NOTE — Telephone Encounter (Signed)
-----   Message from Glean Hess, MD sent at 08/10/2015 11:57 AM EST ----- CXR shows no pneumonia.  There are chronic changes of mild COPD - very important to work on quitting smoking.  Follow up with me if not improving after antibiotics.

## 2015-10-03 ENCOUNTER — Other Ambulatory Visit: Payer: Self-pay

## 2015-10-03 MED ORDER — ZOLPIDEM TARTRATE 10 MG PO TABS: 10.0000 mg | ORAL_TABLET | Freq: Every day | ORAL | Status: DC

## 2015-10-03 NOTE — Telephone Encounter (Signed)
Patient called in requesting refill of medication.

## 2015-12-05 ENCOUNTER — Other Ambulatory Visit: Payer: Self-pay | Admitting: Internal Medicine

## 2015-12-05 MED ORDER — ZOLPIDEM TARTRATE 10 MG PO TABS: 10.0000 mg | ORAL_TABLET | Freq: Every day | ORAL | Status: DC

## 2016-01-29 ENCOUNTER — Other Ambulatory Visit: Payer: Self-pay | Admitting: Internal Medicine

## 2016-01-29 DIAGNOSIS — F324 Major depressive disorder, single episode, in partial remission: Secondary | ICD-10-CM

## 2016-04-27 ENCOUNTER — Other Ambulatory Visit: Payer: Self-pay | Admitting: Internal Medicine

## 2016-04-27 DIAGNOSIS — Z1231 Encounter for screening mammogram for malignant neoplasm of breast: Secondary | ICD-10-CM

## 2016-05-10 ENCOUNTER — Other Ambulatory Visit: Payer: Self-pay | Admitting: Internal Medicine

## 2016-05-10 MED ORDER — ZOLPIDEM TARTRATE 10 MG PO TABS: 10.0000 mg | ORAL_TABLET | Freq: Every day | ORAL | 1 refills | Status: DC

## 2016-05-29 ENCOUNTER — Ambulatory Visit (INDEPENDENT_AMBULATORY_CARE_PROVIDER_SITE_OTHER): Payer: Federal, State, Local not specified - PPO | Admitting: Internal Medicine

## 2016-05-29 ENCOUNTER — Encounter: Payer: Self-pay | Admitting: Internal Medicine

## 2016-05-29 VITALS — BP 136/84 | HR 86 | Temp 98.6°F | Ht 67.0 in | Wt 124.0 lb

## 2016-05-29 DIAGNOSIS — G43009 Migraine without aura, not intractable, without status migrainosus: Secondary | ICD-10-CM | POA: Diagnosis not present

## 2016-05-29 DIAGNOSIS — Z1239 Encounter for other screening for malignant neoplasm of breast: Secondary | ICD-10-CM

## 2016-05-29 DIAGNOSIS — Z23 Encounter for immunization: Secondary | ICD-10-CM | POA: Diagnosis not present

## 2016-05-29 DIAGNOSIS — F17200 Nicotine dependence, unspecified, uncomplicated: Secondary | ICD-10-CM | POA: Diagnosis not present

## 2016-05-29 DIAGNOSIS — Z1231 Encounter for screening mammogram for malignant neoplasm of breast: Secondary | ICD-10-CM

## 2016-05-29 DIAGNOSIS — G4700 Insomnia, unspecified: Secondary | ICD-10-CM

## 2016-05-29 DIAGNOSIS — Z0001 Encounter for general adult medical examination with abnormal findings: Secondary | ICD-10-CM | POA: Diagnosis not present

## 2016-05-29 DIAGNOSIS — F324 Major depressive disorder, single episode, in partial remission: Secondary | ICD-10-CM | POA: Diagnosis not present

## 2016-05-29 DIAGNOSIS — Z0000 Encounter for general adult medical examination without abnormal findings: Secondary | ICD-10-CM

## 2016-05-29 LAB — POCT URINALYSIS DIPSTICK
BILIRUBIN UA: NEGATIVE
Glucose, UA: NEGATIVE
KETONES UA: NEGATIVE
Leukocytes, UA: NEGATIVE
Nitrite, UA: NEGATIVE
PH UA: 5
Protein, UA: NEGATIVE
RBC UA: NEGATIVE
Spec Grav, UA: 1.015
Urobilinogen, UA: 0.2

## 2016-05-29 MED ORDER — VARENICLINE TARTRATE 0.5 MG X 11 & 1 MG X 42 PO MISC: ORAL | 0 refills | Status: DC

## 2016-05-29 MED ORDER — VARENICLINE TARTRATE 1 MG PO TABS: 1.0000 mg | ORAL_TABLET | Freq: Two times a day (BID) | ORAL | 3 refills | Status: DC

## 2016-05-29 NOTE — Progress Notes (Signed)
Date:  05/29/2016   Name:  Carolyn Dunlap   DOB:  [birth year]   MRN:  [id number]   Chief Complaint: Annual Exam Carolyn Dunlap is a 65 y.o. female who presents today for her Complete Annual Exam. She feels well. She reports exercising walking. She reports she is sleeping fairly well. She is having sweats at night - maybe worse since being on anti-depressants.  It is not enough to consider medication at this time.  HRT contraindicated since she smokes.  Insomnia  Primary symptoms: no sleep disturbance.  The problem is unchanged. Past treatments include medication. The treatment provided significant relief.  Migraine   This is a recurrent problem. The problem occurs monthly. The problem has been unchanged. Associated symptoms include insomnia. Pertinent negatives include no abdominal pain, coughing, dizziness, fever, hearing loss, tinnitus or vomiting. She has tried triptans for the symptoms.   Depression - feels low energy and interest despite Lexapro.   Review of Systems  Constitutional: Negative for chills, fatigue and fever.  HENT: Negative for congestion, hearing loss, tinnitus, trouble swallowing and voice change.   Eyes: Negative for visual disturbance.  Respiratory: Negative for cough, chest tightness, shortness of breath and wheezing.   Cardiovascular: Negative for chest pain, palpitations and leg swelling.  Gastrointestinal: Negative for abdominal pain, constipation, diarrhea and vomiting.  Endocrine: Negative for polydipsia and polyuria.  Genitourinary: Negative for dysuria, frequency, genital sores, vaginal bleeding and vaginal discharge.       Sweats at night - worse since being on SSRIs  Musculoskeletal: Negative for arthralgias, gait problem and joint swelling.  Skin: Negative for color change and rash.  Neurological: Positive for headaches (intermittent). Negative for dizziness, tremors and light-headedness.  Hematological: Negative for adenopathy. Does not  bruise/bleed easily.  Psychiatric/Behavioral: Positive for dysphoric mood. Negative for sleep disturbance. The patient has insomnia. The patient is not nervous/anxious.     Patient Active Problem List   Diagnosis Date Noted  . Tobacco use disorder 05/29/2016  . Migraine without aura and without status migrainosus, not intractable 03/28/2015  . GERD without esophagitis 03/28/2015  . Osteoporosis 03/28/2015  . Insomnia 03/28/2015  . Major depression in partial remission ([place]) 03/28/2015  . Anxiety disorder 03/28/2015    Prior to Admission medications   Medication Sig Start Date End Date Taking? Authorizing Provider  AFLURIA SUSP  03/23/16  Yes Historical Provider, MD  Calcium-Magnesium-Vitamin D (CALCIUM 500 PO) Take by mouth.   Yes Historical Provider, MD  escitalopram (LEXAPRO) 20 MG tablet TAKE 1 TABLET DAILY 01/29/16  Yes Glean Hess, MD  Multiple Vitamin (MULTI-DAY PO) Take by mouth.   Yes Historical Provider, MD  SUMAtriptan (IMITREX) 100 MG tablet TAKE 1 TABLET DAILY AS     DIRECTED BY YOUR DOCTOR 01/29/16  Yes Glean Hess, MD  traZODone (DESYREL) 50 MG tablet TAKE 1 TABLET AT BEDTIME 01/29/16  Yes Glean Hess, MD  zolpidem (AMBIEN) 10 MG tablet Take 1 tablet (10 mg total) by mouth at bedtime. 05/10/16  Yes Glean Hess, MD    Allergies  Allergen Reactions  . Zoloft [Sertraline Hcl]     diaphoresis  . Penicillins Rash    Past Surgical History:  Procedure Laterality Date  . AUGMENTATION MAMMAPLASTY Bilateral 1976  . BREAST ENHANCEMENT SURGERY  1976  . CATARACT EXTRACTION  2002  . OOPHORECTOMY  1974    Social History  Substance Use Topics  . Smoking status: Current Some Day Smoker    Packs/day:  0.50    Years: 40.00    Types: Cigarettes  . Smokeless tobacco: Current User  . Alcohol use 1.2 oz/week    2 Standard drinks or equivalent per week     Medication list has been reviewed and updated.   Physical Exam  Constitutional: She is oriented to  person, place, and time. She appears well-developed and well-nourished. No distress.  HENT:  Head: Normocephalic and atraumatic.  Right Ear: Tympanic membrane and ear canal normal.  Left Ear: Tympanic membrane and ear canal normal.  Nose: Right sinus exhibits no maxillary sinus tenderness. Left sinus exhibits no maxillary sinus tenderness.  Mouth/Throat: Uvula is midline and oropharynx is clear and moist.  Eyes: Conjunctivae and EOM are normal. Right eye exhibits no discharge. Left eye exhibits no discharge. No scleral icterus.  Neck: Normal range of motion. Carotid bruit is not present. No erythema present. No thyromegaly present.  Cardiovascular: Normal rate, regular rhythm, normal heart sounds and normal pulses.   Pulmonary/Chest: Effort normal. No respiratory distress. She has no wheezes. She has no rales. Right breast exhibits no mass, no nipple discharge, no skin change and no tenderness. Left breast exhibits no mass, no nipple discharge, no skin change and no tenderness.  Bilateral breast implants - firm, left implant shifted laterally; no tenderness  Abdominal: Soft. Bowel sounds are normal. There is no hepatosplenomegaly. There is no tenderness. There is no CVA tenderness.  Genitourinary: Rectum normal, vagina normal and uterus normal. There is no rash, tenderness or lesion on the right labia. There is no rash, tenderness or lesion on the left labia. Cervix exhibits no motion tenderness, no discharge and no friability. Right adnexum displays no mass, no tenderness and no fullness. Left adnexum displays no mass, no tenderness and no fullness.  Musculoskeletal: Normal range of motion. She exhibits no edema or tenderness.  Lymphadenopathy:    She has no cervical adenopathy.    She has no axillary adenopathy.  Neurological: She is alert and oriented to person, place, and time. She has normal reflexes. No cranial nerve deficit or sensory deficit.  Skin: Skin is warm, dry and intact. No rash  noted.  Spider veins noted both ankles - non tender  Psychiatric: Her speech is normal and behavior is normal. Thought content normal. She exhibits a depressed mood.  Nursing note and vitals reviewed.   BP 136/84   Pulse 86   Temp 98.6 F (37 C)   Ht 5\' 7"  (1.702 m)   Wt 124 lb (56.2 kg)   SpO2 98%   BMI 19.42 kg/m   Assessment and Plan: 1. Annual physical exam Normal exam If sweats worsen, return to discuss other therapy - CBC with Differential/Platelet - Comprehensive metabolic panel - Lipid panel - POCT urinalysis dipstick  2. Migraine without aura and without status migrainosus, not intractable Stable, intermittent  3. Major depressive disorder with single episode, in partial remission (HCC) Continue medications - TSH  4. Insomnia, unspecified type Continue medications  5. Breast cancer screening Scheduled later this month  6. Tobacco use disorder Rx for Chantix given - varenicline (CHANTIX CONTINUING MONTH PAK) 1 MG tablet; Take 1 tablet (1 mg total) by mouth 2 (two) times daily.  Dispense: 60 tablet; Refill: 3  7. Need for pneumococcal vaccination - Pneumococcal polysaccharide vaccine 23-valent greater than or equal to 2yo subcutaneous/IM   Halina Maidens, MD [facility]  05/29/2016

## 2016-05-29 NOTE — Patient Instructions (Addendum)

## 2016-05-30 LAB — CBC WITH DIFFERENTIAL/PLATELET
BASOS ABS: 0.1 10*3/uL (ref 0.0–0.2)
Basos: 1 %
EOS (ABSOLUTE): 0.1 10*3/uL (ref 0.0–0.4)
Eos: 1 %
HEMOGLOBIN: 12.3 g/dL (ref 11.1–15.9)
Hematocrit: 38.7 % (ref 34.0–46.6)
Immature Grans (Abs): 0 10*3/uL (ref 0.0–0.1)
Immature Granulocytes: 0 %
LYMPHS ABS: 1.1 10*3/uL (ref 0.7–3.1)
LYMPHS: 14 %
MCH: 30.1 pg (ref 26.6–33.0)
MCHC: 31.8 g/dL (ref 31.5–35.7)
MCV: 95 fL (ref 79–97)
MONOCYTES: 5 %
Monocytes Absolute: 0.4 10*3/uL (ref 0.1–0.9)
Neutrophils Absolute: 6.5 10*3/uL (ref 1.4–7.0)
Neutrophils: 79 %
PLATELETS: 247 10*3/uL (ref 150–379)
RBC: 4.08 x10E6/uL (ref 3.77–5.28)
RDW: 15.2 % (ref 12.3–15.4)
WBC: 8.2 10*3/uL (ref 3.4–10.8)

## 2016-05-30 LAB — COMPREHENSIVE METABOLIC PANEL
ALBUMIN: 4.7 g/dL (ref 3.6–4.8)
ALK PHOS: 88 IU/L (ref 39–117)
ALT: 13 IU/L (ref 0–32)
AST: 20 IU/L (ref 0–40)
Albumin/Globulin Ratio: 1.8 (ref 1.2–2.2)
BILIRUBIN TOTAL: 0.2 mg/dL (ref 0.0–1.2)
BUN / CREAT RATIO: 9 — AB (ref 12–28)
BUN: 8 mg/dL (ref 8–27)
CHLORIDE: 94 mmol/L — AB (ref 96–106)
CO2: 21 mmol/L (ref 18–29)
CREATININE: 0.89 mg/dL (ref 0.57–1.00)
Calcium: 9.2 mg/dL (ref 8.7–10.3)
GFR calc Af Amer: 80 mL/min/{1.73_m2} (ref >59)
GFR calc non Af Amer: 69 mL/min/{1.73_m2} (ref >59)
GLUCOSE: 84 mg/dL (ref 65–99)
Globulin, Total: 2.6 g/dL (ref 1.5–4.5)
Potassium: 5.1 mmol/L (ref 3.5–5.2)
Sodium: 132 mmol/L — ABNORMAL LOW (ref 134–144)
Total Protein: 7.3 g/dL (ref 6.0–8.5)

## 2016-05-30 LAB — LIPID PANEL
Chol/HDL Ratio: 2.4 ratio units (ref 0.0–4.4)
Cholesterol, Total: 177 mg/dL (ref 100–199)
HDL: 74 mg/dL (ref >39)
LDL CALC: 74 mg/dL (ref 0–99)
TRIGLYCERIDES: 146 mg/dL (ref 0–149)
VLDL CHOLESTEROL CAL: 29 mg/dL (ref 5–40)

## 2016-05-30 LAB — TSH: TSH: 2.2 u[IU]/mL (ref 0.450–4.500)

## 2016-06-07 ENCOUNTER — Ambulatory Visit
Admission: RE | Admit: 2016-06-07 | Discharge: 2016-06-07 | Disposition: A | Discharge status: Home / Self Care | Payer: Federal, State, Local not specified - PPO | Source: Ambulatory Visit | Attending: Internal Medicine | Admitting: Internal Medicine

## 2016-06-07 DIAGNOSIS — Z1231 Encounter for screening mammogram for malignant neoplasm of breast: Secondary | ICD-10-CM | POA: Diagnosis present

## 2016-10-02 ENCOUNTER — Other Ambulatory Visit: Payer: Self-pay | Admitting: Internal Medicine

## 2016-10-02 ENCOUNTER — Telehealth: Payer: Self-pay

## 2016-10-02 MED ORDER — ZOLPIDEM TARTRATE 10 MG PO TABS: 10.0000 mg | ORAL_TABLET | Freq: Every day | ORAL | 1 refills | Status: DC

## 2016-10-02 NOTE — Telephone Encounter (Signed)
ERROR. Added note to incoming call.

## 2017-01-05 ENCOUNTER — Other Ambulatory Visit: Payer: Self-pay | Admitting: Internal Medicine

## 2017-03-18 ENCOUNTER — Other Ambulatory Visit: Payer: Self-pay | Admitting: Internal Medicine

## 2017-03-18 DIAGNOSIS — F324 Major depressive disorder, single episode, in partial remission: Secondary | ICD-10-CM

## 2017-03-26 ENCOUNTER — Other Ambulatory Visit: Payer: Self-pay

## 2017-03-26 MED ORDER — ZOLPIDEM TARTRATE 10 MG PO TABS: 10.0000 mg | ORAL_TABLET | Freq: Every day | ORAL | 0 refills | Status: DC

## 2017-05-29 ENCOUNTER — Other Ambulatory Visit: Payer: Self-pay | Admitting: Internal Medicine

## 2017-05-30 ENCOUNTER — Encounter: Payer: Self-pay | Admitting: Internal Medicine

## 2017-05-30 ENCOUNTER — Other Ambulatory Visit: Payer: Self-pay | Admitting: Internal Medicine

## 2017-05-30 ENCOUNTER — Telehealth: Payer: Self-pay | Admitting: Internal Medicine

## 2017-05-30 MED ORDER — ZOLPIDEM TARTRATE 10 MG PO TABS: 10.0000 mg | ORAL_TABLET | Freq: Every day | ORAL | 1 refills | Status: DC

## 2017-05-30 NOTE — Telephone Encounter (Signed)
Patient is requesting refill med for zolpidem (AMBIEN) 10 MG tablet [[id number]]  Mail delivery.

## 2017-05-30 NOTE — Telephone Encounter (Signed)
See above message. Patient requesting medication. Has appointment in a few weeks. Please advise.

## 2017-06-07 ENCOUNTER — Telehealth: Payer: Self-pay

## 2017-06-07 NOTE — Telephone Encounter (Signed)
[facility] called for clarification about Ambien rx written on 05/30/2017. That start date is labeled at 06/22/2016 and they wanted to verify that we meant 2019 along with seeing if patient can fill early. I informed him patient cannot receive medication in mail until start date but they can fill a few days early so that she recieves it by 06/22/2017. He verbalized understanding and put the note in patient chart.

## 2017-06-23 ENCOUNTER — Other Ambulatory Visit: Payer: Self-pay | Admitting: Internal Medicine

## 2017-06-24 ENCOUNTER — Other Ambulatory Visit: Payer: Self-pay | Admitting: Internal Medicine

## 2017-06-25 ENCOUNTER — Ambulatory Visit (INDEPENDENT_AMBULATORY_CARE_PROVIDER_SITE_OTHER): Payer: Medicare Other | Admitting: Internal Medicine

## 2017-06-25 ENCOUNTER — Encounter: Payer: Self-pay | Admitting: Internal Medicine

## 2017-06-25 VITALS — BP 110/70 | HR 64 | Ht 67.0 in | Wt 124.0 lb

## 2017-06-25 DIAGNOSIS — G43009 Migraine without aura, not intractable, without status migrainosus: Secondary | ICD-10-CM | POA: Diagnosis not present

## 2017-06-25 DIAGNOSIS — Z23 Encounter for immunization: Secondary | ICD-10-CM | POA: Diagnosis not present

## 2017-06-25 DIAGNOSIS — Z1231 Encounter for screening mammogram for malignant neoplasm of breast: Secondary | ICD-10-CM

## 2017-06-25 DIAGNOSIS — Z1239 Encounter for other screening for malignant neoplasm of breast: Secondary | ICD-10-CM

## 2017-06-25 DIAGNOSIS — Z1211 Encounter for screening for malignant neoplasm of colon: Secondary | ICD-10-CM

## 2017-06-25 DIAGNOSIS — F324 Major depressive disorder, single episode, in partial remission: Secondary | ICD-10-CM

## 2017-06-25 DIAGNOSIS — F17200 Nicotine dependence, unspecified, uncomplicated: Secondary | ICD-10-CM

## 2017-06-25 DIAGNOSIS — Z0000 Encounter for general adult medical examination without abnormal findings: Secondary | ICD-10-CM | POA: Diagnosis not present

## 2017-06-25 DIAGNOSIS — G4700 Insomnia, unspecified: Secondary | ICD-10-CM | POA: Diagnosis not present

## 2017-06-25 LAB — POCT URINALYSIS DIPSTICK
Bilirubin, UA: NEGATIVE
Glucose, UA: NEGATIVE
KETONES UA: 40
Leukocytes, UA: NEGATIVE
NITRITE UA: NEGATIVE
PH UA: 6.5 (ref 5.0–8.0)
PROTEIN UA: NEGATIVE
RBC UA: NEGATIVE
SPEC GRAV UA: 1.015 (ref 1.010–1.025)
UROBILINOGEN UA: 0.2 U/dL

## 2017-06-25 MED ORDER — ZOSTER VAC RECOMB ADJUVANTED 50 MCG/0.5ML IM SUSR: 0.5000 mL | Freq: Once | INTRAMUSCULAR | 1 refills | Status: AC

## 2017-06-25 NOTE — Progress Notes (Signed)
Date:  06/25/2017   Name:  Carolyn Dunlap   DOB:  September 28, 1950   MRN:  [id number]   Chief Complaint: Annual Exam (Breast Exam. ) Carolyn Dunlap is a 67 y.o. female who presents today for her Complete Annual Exam. She feels well. She reports exercising regularly. She reports she is sleeping well.  She denies breast problems, her implants are encapsulated but unchanged.  Migraine   This is a recurrent problem. The problem occurs monthly. The pain does not radiate. The pain quality is similar to prior headaches. Associated symptoms include insomnia. Pertinent negatives include no abdominal pain, coughing, dizziness, fever, hearing loss, tinnitus or vomiting.  Insomnia  Primary symptoms: no sleep disturbance, difficulty falling asleep, frequent awakening.  The problem occurs nightly. Past treatments include medication. The treatment provided significant relief. PMH includes: depression.  Depression         This is a chronic problem.  The problem has been waxing and waning since onset.  Associated symptoms include insomnia.  Associated symptoms include no fatigue and no headaches.  Past treatments include SSRIs - Selective serotonin reuptake inhibitors. Tobacco use - she continues to smoke but has no SOB or chronic cough.  She had cut back but then increased again, now smoking 1/2 ppd.  Her total tobacco use is about 20 pk-yrs.   Review of Systems  Constitutional: Negative for chills, fatigue and fever.  HENT: Negative for congestion, hearing loss, tinnitus, trouble swallowing and voice change.   Eyes: Negative for visual disturbance.  Respiratory: Negative for cough, chest tightness, shortness of breath and wheezing.   Cardiovascular: Negative for chest pain, palpitations and leg swelling.  Gastrointestinal: Negative for abdominal pain, constipation, diarrhea and vomiting.  Endocrine: Negative for polydipsia and polyuria.  Genitourinary: Negative for dysuria, frequency, genital sores,  vaginal bleeding and vaginal discharge.  Musculoskeletal: Negative for arthralgias, gait problem and joint swelling.  Skin: Negative for color change and rash.  Neurological: Negative for dizziness, tremors, light-headedness and headaches.  Hematological: Negative for adenopathy. Does not bruise/bleed easily.  Psychiatric/Behavioral: Positive for depression. Negative for dysphoric mood and sleep disturbance. The patient has insomnia. The patient is not nervous/anxious.     Patient Active Problem List   Diagnosis Date Noted  . Tobacco use disorder 05/29/2016  . Migraine without aura and without status migrainosus, not intractable 03/28/2015  . GERD without esophagitis 03/28/2015  . Osteoporosis 03/28/2015  . Insomnia 03/28/2015  . Major depression in partial remission ([place]) 03/28/2015  . Anxiety disorder 03/28/2015    Prior to Admission medications   Medication Sig Start Date End Date Taking? Authorizing Provider  Calcium-Magnesium-Vitamin D (CALCIUM 500 PO) Take by mouth.   Yes [provider]  escitalopram (LEXAPRO) 20 MG tablet TAKE 1 TABLET DAILY 03/19/17  Yes Glean Hess, MD  Multiple Vitamin (MULTI-DAY PO) Take by mouth.   Yes [provider]  SUMAtriptan (IMITREX) 100 MG tablet TAKE 1 TABLET DAILY AS     DIRECTED BY YOUR DOCTOR 01/07/17  Yes Glean Hess, MD  traZODone (DESYREL) 50 MG tablet TAKE 1 TABLET AT BEDTIME 05/29/17  Yes Glean Hess, MD  zolpidem (AMBIEN) 10 MG tablet Take 1 tablet (10 mg total) by mouth at bedtime. 06/22/16  Yes Glean Hess, MD    Allergies  Allergen Reactions  . Zoloft [Sertraline Hcl]     diaphoresis  . Penicillins Rash    Past Surgical History:  Procedure Laterality Date  . AUGMENTATION MAMMAPLASTY Bilateral  [place]  . CATARACT EXTRACTION  2002    Social History   Tobacco Use  . Smoking status: Current Some Day Smoker    Packs/day: 0.50    Years: 40.00    Pack  years: 20.00    Types: Cigarettes  . Smokeless tobacco: Current User  Substance Use Topics  . Alcohol use: Yes    Alcohol/week: 1.2 oz    Types: 2 Standard drinks or equivalent per week  . Drug use: No     Medication list has been reviewed and updated.  PHQ 2/9 Scores 06/25/2017 05/29/2016  PHQ - 2 Score 0 4  PHQ- 9 Score 0 9    Physical Exam  Constitutional: She is oriented to person, place, and time. She appears well-developed and well-nourished. No distress.  HENT:  Head: Normocephalic and atraumatic.  Right Ear: Tympanic membrane and ear canal normal.  Left Ear: Tympanic membrane and ear canal normal.  Nose: Right sinus exhibits no maxillary sinus tenderness. Left sinus exhibits no maxillary sinus tenderness.  Mouth/Throat: Uvula is midline and oropharynx is clear and moist.  Eyes: Conjunctivae and EOM are normal. Right eye exhibits no discharge. Left eye exhibits no discharge. No scleral icterus.  Neck: Normal range of motion. Carotid bruit is not present. No erythema present. No thyromegaly present.  Cardiovascular: Normal rate, regular rhythm, normal heart sounds and normal pulses.  Pulmonary/Chest: Effort normal. No respiratory distress. She has no wheezes. Right breast exhibits no mass, no nipple discharge, no skin change and no tenderness. Left breast exhibits no mass, no nipple discharge, no skin change and no tenderness.  Breast implants encapsulated and hard  Abdominal: Soft. Bowel sounds are normal. There is no hepatosplenomegaly. There is no tenderness. There is no CVA tenderness.  Musculoskeletal: Normal range of motion.  Lymphadenopathy:    She has no cervical adenopathy.    She has no axillary adenopathy.  Neurological: She is alert and oriented to person, place, and time. She has normal reflexes. No cranial nerve deficit or sensory deficit.  Skin: Skin is warm, dry and intact. No rash noted.  Psychiatric: She has a normal mood and affect. Her speech is normal  and behavior is normal. Thought content normal. Her mood appears not anxious. Cognition and memory are normal. She does not exhibit a depressed mood.  Nursing note and vitals reviewed.   BP 110/70   Pulse 64   Ht 5\' 7"  (1.702 m)   Wt 124 lb (56.2 kg)   BMI 19.42 kg/m   Assessment and Plan: 1. Annual physical exam Up to date with vaccinations - CBC with Differential/Platelet - Comprehensive metabolic panel - Lipid panel - POCT urinalysis dipstick  2. Breast cancer screening Schedule at [place]; Future  3. Migraine without aura and without status migrainosus, not intractable Stable, responds well to imitrex  4. Major depressive disorder with single episode, in partial remission ([place]) Controlled on SSRI - TSH  5. Insomnia, unspecified type chronic  6. Tobacco use disorder Not interested in quitting at this time  7. Need for shingles vaccine Contact pharmacy - Zoster Vaccine Adjuvanted [facility]) injection; Inject 0.5 mLs into the muscle once for 1 dose.  Dispense: 0.5 mL; Refill: 1  8. Colon cancer screening Due for 10 yr follow up - Ambulatory referral to Gastroenterology   Meds ordered this encounter  Medications  . Zoster Vaccine Adjuvanted [facility]) injection    Sig: Inject 0.5 mLs into  the muscle once for 1 dose.    Dispense:  0.5 mL    Refill:  1    Partially dictated using Editor, commissioning. Any errors are unintentional.  Halina Maidens, MD [facility]  06/25/2017

## 2017-06-25 NOTE — Patient Instructions (Signed)

## 2017-06-26 ENCOUNTER — Other Ambulatory Visit: Payer: Self-pay

## 2017-06-26 LAB — COMPREHENSIVE METABOLIC PANEL
A/G RATIO: 1.8 (ref 1.2–2.2)
ALT: 16 IU/L (ref 0–32)
AST: 20 IU/L (ref 0–40)
Albumin: 4.4 g/dL (ref 3.6–4.8)
Alkaline Phosphatase: 69 IU/L (ref 39–117)
BUN/Creatinine Ratio: 11 — ABNORMAL LOW (ref 12–28)
BUN: 9 mg/dL (ref 8–27)
Bilirubin Total: 0.4 mg/dL (ref 0.0–1.2)
CALCIUM: 9.3 mg/dL (ref 8.7–10.3)
CO2: 23 mmol/L (ref 20–29)
Chloride: 94 mmol/L — ABNORMAL LOW (ref 96–106)
Creatinine, Ser: 0.81 mg/dL (ref 0.57–1.00)
GFR, EST AFRICAN AMERICAN: 88 mL/min/{1.73_m2} (ref >59)
GFR, EST NON AFRICAN AMERICAN: 76 mL/min/{1.73_m2} (ref >59)
Globulin, Total: 2.5 g/dL (ref 1.5–4.5)
Glucose: 91 mg/dL (ref 65–99)
POTASSIUM: 5 mmol/L (ref 3.5–5.2)
Sodium: 132 mmol/L — ABNORMAL LOW (ref 134–144)
Total Protein: 6.9 g/dL (ref 6.0–8.5)

## 2017-06-26 LAB — CBC WITH DIFFERENTIAL/PLATELET
BASOS: 0 %
Basophils Absolute: 0 10*3/uL (ref 0.0–0.2)
EOS (ABSOLUTE): 0.1 10*3/uL (ref 0.0–0.4)
EOS: 1 %
HEMATOCRIT: 36.9 % (ref 34.0–46.6)
Hemoglobin: 12.6 g/dL (ref 11.1–15.9)
IMMATURE GRANULOCYTES: 0 %
Immature Grans (Abs): 0 10*3/uL (ref 0.0–0.1)
LYMPHS ABS: 1 10*3/uL (ref 0.7–3.1)
Lymphs: 11 %
MCH: 31.3 pg (ref 26.6–33.0)
MCHC: 34.1 g/dL (ref 31.5–35.7)
MCV: 92 fL (ref 79–97)
MONOS ABS: 0.5 10*3/uL (ref 0.1–0.9)
Monocytes: 6 %
NEUTROS ABS: 7.4 10*3/uL — AB (ref 1.4–7.0)
NEUTROS PCT: 82 %
Platelets: 278 10*3/uL (ref 150–379)
RBC: 4.03 x10E6/uL (ref 3.77–5.28)
RDW: 14.9 % (ref 12.3–15.4)
WBC: 9 10*3/uL (ref 3.4–10.8)

## 2017-06-26 LAB — LIPID PANEL
CHOL/HDL RATIO: 1.9 ratio (ref 0.0–4.4)
Cholesterol, Total: 189 mg/dL (ref 100–199)
HDL: 101 mg/dL (ref >39)
LDL Calculated: 73 mg/dL (ref 0–99)
Triglycerides: 76 mg/dL (ref 0–149)
VLDL Cholesterol Cal: 15 mg/dL (ref 5–40)

## 2017-06-26 LAB — TSH: TSH: 1.91 u[IU]/mL (ref 0.450–4.500)

## 2017-06-27 NOTE — Progress Notes (Signed)
Patient informed normal labs- told to call if questions.

## 2017-06-28 ENCOUNTER — Other Ambulatory Visit: Payer: Self-pay

## 2017-06-28 DIAGNOSIS — Z1211 Encounter for screening for malignant neoplasm of colon: Secondary | ICD-10-CM

## 2017-07-03 ENCOUNTER — Other Ambulatory Visit: Payer: Self-pay

## 2017-07-03 ENCOUNTER — Encounter: Payer: Self-pay | Admitting: *Deleted

## 2017-07-09 NOTE — Discharge Instructions (Signed)
General Anesthesia, Adult, Care After °These instructions provide you with information about caring for yourself after your procedure. Your health care provider may also give you more specific instructions. Your treatment has been planned according to current medical practices, but problems sometimes occur. Call your health care provider if you have any problems or questions after your procedure. °What can I expect after the procedure? °After the procedure, it is common to have: °· Vomiting. °· A sore throat. °· Mental slowness. ° °It is common to feel: °· Nauseous. °· Cold or shivery. °· Sleepy. °· Tired. °· Sore or achy, even in parts of your body where you did not have surgery. ° °Follow these instructions at home: °For at least 24 hours after the procedure: °· Do not: °? Participate in activities where you could fall or become injured. °? Drive. °? Use heavy machinery. °? Drink alcohol. °? Take sleeping pills or medicines that cause drowsiness. °? Make important decisions or sign legal documents. °? Take care of children on your own. °· Rest. °Eating and drinking °· If you vomit, drink water, juice, or soup when you can drink without vomiting. °· Drink enough fluid to keep your urine clear or pale yellow. °· Make sure you have little or no nausea before eating solid foods. °· Follow the diet recommended by your health care provider. °General instructions °· Have a responsible adult stay with you until you are awake and alert. °· Return to your normal activities as told by your health care provider. Ask your health care provider what activities are safe for you. °· Take over-the-counter and prescription medicines only as told by your health care provider. °· If you smoke, do not smoke without supervision. °· Keep all follow-up visits as told by your health care provider. This is important. °Contact a health care provider if: °· You continue to have nausea or vomiting at home, and medicines are not helpful. °· You  cannot drink fluids or start eating again. °· You cannot urinate after 8-12 hours. °· You develop a skin rash. °· You have fever. °· You have increasing redness at the site of your procedure. °Get help right away if: °· You have difficulty breathing. °· You have chest pain. °· You have unexpected bleeding. °· You feel that you are having a life-threatening or urgent problem. °This information is not intended to replace advice given to you by your health care provider. Make sure you discuss any questions you have with your health care provider. °Document Released: 09/03/2000 Document Revised: 10/31/2015 Document Reviewed: 05/12/2015 °Elsevier Interactive Patient Education © 2018 [employer] ° °

## 2017-07-10 ENCOUNTER — Ambulatory Visit
Admission: RE | Admit: 2017-07-10 | Discharge: 2017-07-10 | Disposition: A | Discharge status: Home / Self Care | Payer: Medicare Other | Source: Ambulatory Visit | Attending: Gastroenterology | Admitting: Gastroenterology

## 2017-07-10 ENCOUNTER — Encounter
Admission: RE | Disposition: A | Discharge status: Home / Self Care | Payer: Self-pay | Source: Ambulatory Visit | Attending: Gastroenterology

## 2017-07-10 ENCOUNTER — Ambulatory Visit: Payer: Medicare Other | Admitting: Anesthesiology

## 2017-07-10 DIAGNOSIS — F17210 Nicotine dependence, cigarettes, uncomplicated: Secondary | ICD-10-CM | POA: Insufficient documentation

## 2017-07-10 DIAGNOSIS — D120 Benign neoplasm of cecum: Secondary | ICD-10-CM

## 2017-07-10 DIAGNOSIS — D125 Benign neoplasm of sigmoid colon: Secondary | ICD-10-CM | POA: Diagnosis not present

## 2017-07-10 DIAGNOSIS — Z1211 Encounter for screening for malignant neoplasm of colon: Secondary | ICD-10-CM

## 2017-07-10 DIAGNOSIS — Z888 Allergy status to other drugs, medicaments and biological substances status: Secondary | ICD-10-CM | POA: Diagnosis not present

## 2017-07-10 DIAGNOSIS — Q438 Other specified congenital malformations of intestine: Secondary | ICD-10-CM | POA: Diagnosis not present

## 2017-07-10 DIAGNOSIS — K635 Polyp of colon: Secondary | ICD-10-CM | POA: Insufficient documentation

## 2017-07-10 DIAGNOSIS — K219 Gastro-esophageal reflux disease without esophagitis: Secondary | ICD-10-CM | POA: Diagnosis not present

## 2017-07-10 DIAGNOSIS — Z880 Allergy status to penicillin: Secondary | ICD-10-CM | POA: Diagnosis not present

## 2017-07-10 DIAGNOSIS — Z79899 Other long term (current) drug therapy: Secondary | ICD-10-CM | POA: Insufficient documentation

## 2017-07-10 HISTORY — DX: Other specified postprocedural states: Z98.890

## 2017-07-10 HISTORY — DX: Other specified postprocedural states: R11.2

## 2017-07-10 HISTORY — DX: Migraine, unspecified, not intractable, without status migrainosus: G43.909

## 2017-07-10 HISTORY — PX: POLYPECTOMY: SHX5525

## 2017-07-10 HISTORY — PX: COLONOSCOPY WITH PROPOFOL: SHX5780

## 2017-07-10 SURGERY — COLONOSCOPY WITH PROPOFOL
Anesthesia: General | Site: Rectum | Wound class: Dirty or Infected

## 2017-07-10 MED ORDER — LIDOCAINE HCL (CARDIAC) 20 MG/ML IV SOLN
INTRAVENOUS | Status: DC | PRN
  Administered 2017-07-10: 25 mg via INTRAVENOUS

## 2017-07-10 MED ORDER — PROPOFOL 500 MG/50ML IV EMUL
INTRAVENOUS | Status: DC | PRN
  Administered 2017-07-10: 75 ug/kg/min via INTRAVENOUS

## 2017-07-10 MED ORDER — LACTATED RINGERS IV SOLN
10.0000 mL/h | INTRAVENOUS | Status: DC
  Administered 2017-07-10: 10 mL/h via INTRAVENOUS
  Administered 2017-07-10: 10:00:00 via INTRAVENOUS

## 2017-07-10 MED ORDER — SODIUM CHLORIDE 0.9 % IV SOLN: INTRAVENOUS | Status: DC

## 2017-07-10 MED ORDER — MEPERIDINE HCL 25 MG/ML IJ SOLN: 6.2500 mg | INTRAMUSCULAR | Status: DC | PRN

## 2017-07-10 MED ORDER — FENTANYL CITRATE (PF) 100 MCG/2ML IJ SOLN: 25.0000 ug | INTRAMUSCULAR | Status: DC | PRN

## 2017-07-10 MED ORDER — PROMETHAZINE HCL 25 MG/ML IJ SOLN: 6.2500 mg | INTRAMUSCULAR | Status: DC | PRN

## 2017-07-10 MED ORDER — OXYCODONE HCL 5 MG/5ML PO SOLN: 5.0000 mg | Freq: Once | ORAL | Status: DC | PRN

## 2017-07-10 MED ORDER — DEXMEDETOMIDINE HCL 200 MCG/2ML IV SOLN
INTRAVENOUS | Status: DC | PRN
  Administered 2017-07-10 (×2): 4 ug via INTRAVENOUS

## 2017-07-10 MED ORDER — STERILE WATER FOR IRRIGATION IR SOLN
Status: DC | PRN
  Administered 2017-07-10: 11:00:00

## 2017-07-10 MED ORDER — OXYCODONE HCL 5 MG PO TABS: 5.0000 mg | ORAL_TABLET | Freq: Once | ORAL | Status: DC | PRN

## 2017-07-10 SURGICAL SUPPLY — 25 items
CANISTER SUCT 1200ML W/VALVE (MISCELLANEOUS) ×2 IMPLANT
CLIP HMST 235XBRD CATH ROT (MISCELLANEOUS) IMPLANT
CLIP RESOLUTION 360 11X235 (MISCELLANEOUS)
ELECT REM PT RETURN 9FT ADLT (ELECTROSURGICAL)
ELECTRODE REM PT RTRN 9FT ADLT (ELECTROSURGICAL) IMPLANT
FCP ESCP3.2XJMB 240X2.8X (MISCELLANEOUS)
FORCEPS BIOP RAD 4 LRG CAP 4 (CUTTING FORCEPS) ×2 IMPLANT
FORCEPS BIOP RJ4 240 W/NDL (MISCELLANEOUS)
FORCEPS ESCP3.2XJMB 240X2.8X (MISCELLANEOUS) IMPLANT
GOWN CVR UNV OPN BCK APRN NK (MISCELLANEOUS) ×2 IMPLANT
GOWN ISOL THUMB LOOP REG UNIV (MISCELLANEOUS) ×2
INJECTOR VARIJECT VIN23 (MISCELLANEOUS) IMPLANT
KIT DEFENDO VALVE AND CONN (KITS) IMPLANT
KIT ENDO PROCEDURE OLY (KITS) ×2 IMPLANT
MARKER SPOT ENDO TATTOO 5ML (MISCELLANEOUS) IMPLANT
PROBE APC STR FIRE (PROBE) IMPLANT
RETRIEVER NET ROTH 2.5X230 LF (MISCELLANEOUS) IMPLANT
SNARE COLD EXACTO (MISCELLANEOUS) ×2 IMPLANT
SNARE SHORT THROW 13M SML OVAL (MISCELLANEOUS) ×2 IMPLANT
SNARE SHORT THROW 30M LRG OVAL (MISCELLANEOUS) IMPLANT
SNARE SNG USE RND 15MM (INSTRUMENTS) IMPLANT
SPOT EX ENDOSCOPIC TATTOO (MISCELLANEOUS)
TRAP ETRAP POLY (MISCELLANEOUS) ×2 IMPLANT
VARIJECT INJECTOR VIN23 (MISCELLANEOUS)
WATER STERILE IRR 250ML POUR (IV SOLUTION) ×2 IMPLANT

## 2017-07-10 NOTE — Anesthesia Preprocedure Evaluation (Signed)
Anesthesia Evaluation  Patient identified by MRN, date of birth, ID band Patient awake    Reviewed: Allergy & Precautions, H&P , NPO status , Patient's Chart, lab work & pertinent test results, reviewed documented beta blocker date and time   History of Anesthesia Complications (+) PONV and history of anesthetic complications (PONV)  Airway Mallampati: II  TM Distance: >3 FB Neck ROM: full    Dental no notable dental hx.    Pulmonary neg pulmonary ROS, Current Smoker,    Pulmonary exam normal breath sounds clear to auscultation       Cardiovascular Exercise Tolerance: Good negative cardio ROS   Rhythm:regular Rate:Normal     Neuro/Psych  Headaches, negative psych ROS   GI/Hepatic Neg liver ROS, GERD  ,  Endo/Other  negative endocrine ROS  Renal/GU negative Renal ROS  negative genitourinary   Musculoskeletal   Abdominal   Peds  Hematology negative hematology ROS (+)   Anesthesia Other Findings   Reproductive/Obstetrics negative OB ROS                             Anesthesia Physical Anesthesia Plan  ASA: II  Anesthesia Plan: General   Post-op Pain Management:    Induction:   PONV Risk Score and Plan:   Airway Management Planned:   Additional Equipment:   Intra-op Plan:   Post-operative Plan:   Informed Consent: I have reviewed the patients History and Physical, chart, labs and discussed the procedure including the risks, benefits and alternatives for the proposed anesthesia with the patient or authorized representative who has indicated his/her understanding and acceptance.   Dental Advisory Given  Plan Discussed with: CRNA  Anesthesia Plan Comments:         Anesthesia Quick Evaluation

## 2017-07-10 NOTE — H&P (Signed)
Carolyn Darby, MD [address]  [place]  [place], [place] [address]  Main: [phone]  Fax: [phone] Pager: [phone]  Primary Care Physician:  Glean Hess, MD Primary Gastroenterologist:  Dr. Cephas Dunlap  Pre-Procedure History & Physical: HPI:  Carolyn Dunlap is a 67 y.o. female is here for an colonoscopy.   Past Medical History:  Diagnosis Date  . Migraine headache    weekly  . PONV (postoperative nausea and vomiting)    in distant past    Past Surgical History:  Procedure Laterality Date  . AUGMENTATION MAMMAPLASTY Bilateral 1976  . BREAST ENHANCEMENT SURGERY  1976  . CATARACT EXTRACTION  2002  . COLONOSCOPY      Prior to Admission medications   Medication Sig Start Date End Date Taking? Authorizing Provider  Calcium-Magnesium-Vitamin D (CALCIUM 500 PO) Take by mouth.   Yes [provider]  escitalopram (LEXAPRO) 20 MG tablet TAKE 1 TABLET DAILY 03/19/17  Yes Glean Hess, MD  Multiple Vitamin (MULTI-DAY PO) Take by mouth.   Yes [provider]  traZODone (DESYREL) 50 MG tablet TAKE 1 TABLET AT BEDTIME 05/29/17  Yes Glean Hess, MD  zolpidem (AMBIEN) 10 MG tablet Take 1 tablet (10 mg total) by mouth at bedtime. 06/22/16  Yes Glean Hess, MD  SUMAtriptan (IMITREX) 100 MG tablet TAKE 1 TABLET DAILY AS     DIRECTED BY YOUR DOCTOR 01/07/17   Glean Hess, MD    Allergies as of 06/28/2017 - Review Complete 06/25/2017  Allergen Reaction Noted  . Zoloft [sertraline hcl]  03/28/2015  . Penicillins Rash 03/28/2015    Family History  Problem Relation Age of Onset  . Hyperlipidemia Mother   . Hypertension Mother   . Hypertension Father   . Breast cancer Neg Hx     Social History   Socioeconomic History  . Marital status: Single    Spouse name: Not on file  . Number of children: Not on file  . Years of education: Not on file  . Highest education level: Not on file  Social Needs  . Financial resource  strain: Not on file  . Food insecurity - worry: Not on file  . Food insecurity - inability: Not on file  . Transportation needs - medical: Not on file  . Transportation needs - non-medical: Not on file  Occupational History  . Not on file  Tobacco Use  . Smoking status: Current Every Day Smoker    Packs/day: 0.50    Years: 20.00    Pack years: 10.00    Types: Cigarettes  . Smokeless tobacco: Never Used  Substance and Sexual Activity  . Alcohol use: Yes    Alcohol/week: 2.4 oz    Types: 4 Glasses of wine per week  . Drug use: No  . Sexual activity: Not on file  Other Topics Concern  . Not on file  Social History Narrative  . Not on file    Review of Systems: See HPI, otherwise negative ROS  Physical Exam: BP 129/87   Pulse 73   Temp (!) 97.2 F (36.2 C) (Temporal)   Resp 16   Ht 5\' 7"  (1.702 m)   Wt 118 lb (53.5 kg)   SpO2 100%   BMI 18.48 kg/m  General:   Alert,  pleasant and cooperative in NAD Head:  Normocephalic and atraumatic. Neck:  Supple; no masses or thyromegaly. Lungs:  Clear throughout to auscultation.    Heart:  Regular  rate and rhythm. Abdomen:  Soft, nontender and nondistended. Normal bowel sounds, without guarding, and without rebound.   Neurologic:  Alert and  oriented x4;  grossly normal neurologically.  Impression/Plan: Carolyn Dunlap is here for an colonoscopy to be performed for colon cancer screening  Risks, benefits, limitations, and alternatives regarding  colonoscopy have been reviewed with the patient.  Questions have been answered.  All parties agreeable.   Sherri Sear, MD  07/10/2017, 9:39 AM

## 2017-07-10 NOTE — Anesthesia Postprocedure Evaluation (Signed)
Anesthesia Post Note  Patient: Carolyn Dunlap  Procedure(s) Performed: COLONOSCOPY WITH PROPOFOL (N/A Rectum) POLYPECTOMY (N/A Rectum)  Patient location during evaluation: PACU Anesthesia Type: General Level of consciousness: awake and alert Pain management: pain level controlled Vital Signs Assessment: post-procedure vital signs reviewed and stable Respiratory status: spontaneous breathing, nonlabored ventilation, respiratory function stable and patient connected to nasal cannula oxygen Cardiovascular status: blood pressure returned to baseline and stable Postop Assessment: no apparent nausea or vomiting Anesthetic complications: no    SCOURAS, NICOLE ELAINE

## 2017-07-10 NOTE — Op Note (Signed)
[facility] Gastroenterology Patient Name: Carolyn Dunlap Procedure Date: 07/10/2017 10:13 AM MRN: [id number] Account #: [id number] Date of Birth: February 24, 1951 Admit Type: Outpatient Age: 67 Room: [facility] OR ROOM 01 Gender: Female Note Status: Finalized Procedure:            Colonoscopy Indications:          Screening for colorectal malignant neoplasm Providers:            Lin Landsman MD, MD Referring MD:         Halina Maidens, MD (Referring MD) Medicines:            Monitored Anesthesia Care Complications:        No immediate complications. Estimated blood loss:                        Minimal. Procedure:            Pre-Anesthesia Assessment:                       - Prior to the procedure, a History and Physical was                        performed, and patient medications and allergies were                        reviewed. The patient is competent. The risks and                        benefits of the procedure and the sedation options and                        risks were discussed with the patient. All questions                        were answered and informed consent was obtained.                        Patient identification and proposed procedure were                        verified by the physician, the nurse, the                        anesthesiologist, the anesthetist and the technician in                        the pre-procedure area in the procedure room. Mental                        Status Examination: alert and oriented. Airway                        Examination: normal oropharyngeal airway and neck                        mobility. Respiratory Examination: clear to                        auscultation. CV Examination: normal. Prophylactic  Antibiotics: The patient does not require prophylactic                        antibiotics. Prior Anticoagulants: The patient has                        taken no previous anticoagulant or  antiplatelet agents.                        ASA Grade Assessment: II - A patient with mild systemic                        disease. After reviewing the risks and benefits, the                        patient was deemed in satisfactory condition to undergo                        the procedure. The anesthesia plan was to use monitored                        anesthesia care (MAC). Immediately prior to                        administration of medications, the patient was                        re-assessed for adequacy to receive sedatives. The                        heart rate, respiratory rate, oxygen saturations, blood                        pressure, adequacy of pulmonary ventilation, and                        response to care were monitored throughout the                        procedure. The physical status of the patient was                        re-assessed after the procedure.                       After obtaining informed consent, the colonoscope was                        passed under direct vision. Throughout the procedure,                        the patient's blood pressure, pulse, and oxygen                        saturations were monitored continuously. The Moody (S#: [id number]) was introduced through  the anus and advanced to the the cecum, identified by                        appendiceal orifice and ileocecal valve. The                        colonoscopy was performed with moderate difficulty due                        to a tortuous colon. Successful completion of the                        procedure was aided by applying abdominal pressure. Findings:      The perianal and digital rectal examinations were normal. Pertinent       negatives include normal sphincter tone and no palpable rectal lesions.      A diminutive polyp was found in the cecum. The polyp was sessile. The       polyp was removed with a cold  biopsy forceps. Resection and retrieval       were complete.      A 7 mm polyp was found in the sigmoid colon. The polyp was flat. The       polyp was removed with a hot snare. Resection and retrieval were       complete.      A 7 mm polyp was found in the sigmoid colon. The polyp was sessile. The       polyp was removed with a hot snare. The polyp was removed with a cold       snare. Resection and retrieval were complete.      The retroflexed view of the distal rectum and anal verge was normal and       showed no anal or rectal abnormalities. Impression:           - One diminutive polyp in the cecum, removed with a                        cold biopsy forceps. Resected and retrieved.                       - One 7 mm polyp in the sigmoid colon, removed with a                        hot snare. Resected and retrieved.                       - One 7 mm polyp in the sigmoid colon, removed with a                        cold snare and removed with a hot snare. Resected and                        retrieved.                       - The distal rectum and anal verge are normal on                        retroflexion view. Recommendation:       - Discharge  patient to home.                       - Resume previous diet today.                       - Continue present medications.                       - Await pathology results.                       - Repeat colonoscopy in 3 - 5 years for surveillance of                        multiple polyps. Procedure Code(s):    --- Professional ---                       [id number], Colonoscopy, flexible; with removal of tumor(s),                        polyp(s), or other lesion(s) by snare technique                       45380, 59, Colonoscopy, flexible; with biopsy, single                        or multiple Diagnosis Code(s):    --- Professional ---                       Z12.11, Encounter for screening for malignant neoplasm                        of colon                        D12.0, Benign neoplasm of cecum                       D12.5, Benign neoplasm of sigmoid colon CPT copyright 2016 American Medical Association. All rights reserved. The codes documented in this report are preliminary and upon coder review may  be revised to meet current compliance requirements. Dr. Ulyess Mort Lin Landsman MD, MD 07/10/2017 11:07:48 AM This report has been signed electronically. Number of Addenda: 0 Note Initiated On: 07/10/2017 10:13 AM Scope Withdrawal Time: 0 hours 30 minutes 31 seconds  Total Procedure Duration: 0 hours 36 minutes 5 seconds       [facility]

## 2017-07-10 NOTE — Transfer of Care (Signed)
Immediate Anesthesia Transfer of Care Note  Patient: Carolyn Dunlap  Procedure(s) Performed: COLONOSCOPY WITH PROPOFOL (N/A Rectum) POLYPECTOMY (N/A Rectum)  Patient Location: PACU  Anesthesia Type: General  Level of Consciousness: awake, alert  and patient cooperative  Airway and Oxygen Therapy: Patient Spontanous Breathing and Patient connected to supplemental oxygen  Post-op Assessment: Post-op Vital signs reviewed, Patient's Cardiovascular Status Stable, Respiratory Function Stable, Patent Airway and No signs of Nausea or vomiting  Post-op Vital Signs: Reviewed and stable  Complications: No apparent anesthesia complications

## 2017-07-16 ENCOUNTER — Encounter: Payer: Self-pay | Admitting: Gastroenterology

## 2017-07-22 ENCOUNTER — Encounter: Payer: Self-pay | Admitting: Gastroenterology

## 2017-08-13 ENCOUNTER — Other Ambulatory Visit: Payer: Self-pay | Admitting: Internal Medicine

## 2017-09-09 ENCOUNTER — Encounter: Payer: Self-pay | Admitting: Internal Medicine

## 2017-09-09 ENCOUNTER — Ambulatory Visit (INDEPENDENT_AMBULATORY_CARE_PROVIDER_SITE_OTHER): Payer: Medicare Other | Admitting: Internal Medicine

## 2017-09-09 VITALS — BP 98/68 | HR 87 | Temp 97.9°F | Ht 67.0 in | Wt 117.0 lb

## 2017-09-09 DIAGNOSIS — R05 Cough: Secondary | ICD-10-CM

## 2017-09-09 DIAGNOSIS — R059 Cough, unspecified: Secondary | ICD-10-CM

## 2017-09-09 DIAGNOSIS — J0100 Acute maxillary sinusitis, unspecified: Secondary | ICD-10-CM

## 2017-09-09 MED ORDER — DOXYCYCLINE HYCLATE 100 MG PO TABS: 100.0000 mg | ORAL_TABLET | Freq: Two times a day (BID) | ORAL | 0 refills | Status: AC

## 2017-09-09 NOTE — Patient Instructions (Addendum)
Robitussin DM - take around the clock.  Remain hydrated.  Use inhaler 2 puffs every 4 hours as needed

## 2017-09-09 NOTE — Progress Notes (Signed)
Date:  09/09/2017   Name:  Carolyn Dunlap   DOB:  [birth year]   MRN:  [id number]   Chief Complaint: Cough (Started 1 week ago. Head cold, congestion, sneezing. Coughing- clear production. Did not feel feverish until lastnight. Chills. )  Cough  This is a new problem. The current episode started in the past 7 days. The problem has been gradually worsening. The problem occurs every few minutes. Associated symptoms include nasal congestion, postnasal drip, rhinorrhea, shortness of breath and wheezing. Pertinent negatives include no chest pain, chills, fever, headaches or sweats. She has tried OTC cough suppressant and a beta-agonist inhaler for the symptoms.  She has not been resting well recently - her mother was in and out of the hospital over the past month.  She passed away last Sep 18, 2022 and she has had family to deal with over the weekend.    Review of Systems  Constitutional: Positive for fatigue. Negative for chills and fever.  HENT: Positive for congestion, postnasal drip, rhinorrhea and sinus pressure. Negative for trouble swallowing.   Eyes: Negative for visual disturbance.  Respiratory: Positive for cough, shortness of breath and wheezing. Negative for chest tightness.   Cardiovascular: Negative for chest pain and palpitations.  Gastrointestinal: Negative for constipation, nausea and vomiting.  Neurological: Negative for dizziness, weakness and headaches.    Patient Active Problem List   Diagnosis Date Noted  . Special screening for malignant neoplasms, colon   . Tobacco use disorder 05/29/2016  . Migraine without aura and without status migrainosus, not intractable 03/28/2015  . GERD without esophagitis 03/28/2015  . Osteoporosis 03/28/2015  . Insomnia 03/28/2015  . Major depression in partial remission ([place]) 03/28/2015  . Anxiety disorder 03/28/2015    Prior to Admission medications   Medication Sig Start Date End Date Taking? Authorizing Provider    Calcium-Magnesium-Vitamin D (CALCIUM 500 PO) Take by mouth.   Yes [provider]  escitalopram (LEXAPRO) 20 MG tablet TAKE 1 TABLET DAILY 03/19/17  Yes Glean Hess, MD  Multiple Vitamin (MULTI-DAY PO) Take by mouth.   Yes [provider]  SUMAtriptan (IMITREX) 100 MG tablet TAKE 1 TABLET DAILY AS     DIRECTED BY YOUR DOCTOR 01/07/17  Yes Glean Hess, MD  traZODone (DESYREL) 50 MG tablet TAKE 1 TABLET AT BEDTIME 05/29/17  Yes Glean Hess, MD  zolpidem (AMBIEN) 10 MG tablet Take 1 tablet (10 mg total) by mouth at bedtime. 06/22/16   Glean Hess, MD    Allergies  Allergen Reactions  . Zoloft [Sertraline Hcl]     diaphoresis  . Adhesive [Tape] Rash    Band-aids  . Penicillins Rash    Past Surgical History:  Procedure Laterality Date  . AUGMENTATION MAMMAPLASTY Bilateral 1976  . BREAST ENHANCEMENT SURGERY  1976  . CATARACT EXTRACTION  09-17-00  . COLONOSCOPY    . COLONOSCOPY WITH PROPOFOL N/A 07/10/2017   Procedure: COLONOSCOPY WITH PROPOFOL;  Surgeon: Lin Landsman, MD;  Location: [place];  Service: Endoscopy;  Laterality: N/A;  . POLYPECTOMY N/A 07/10/2017   Procedure: POLYPECTOMY;  Surgeon: Lin Landsman, MD;  Location: [place];  Service: Endoscopy;  Laterality: N/A;    Social History   Tobacco Use  . Smoking status: Current Every Day Smoker    Packs/day: 0.50    Years: 20.00    Pack years: 10.00    Types: Cigarettes  . Smokeless tobacco: Never Used  Substance Use Topics  . Alcohol use:  Yes    Alcohol/week: 2.4 oz    Types: 4 Glasses of wine per week  . Drug use: No     Medication list has been reviewed and updated.  PHQ 2/9 Scores 06/25/2017 05/29/2016  PHQ - 2 Score 0 4  PHQ- 9 Score 0 9    Physical Exam  Constitutional: She is oriented to person, place, and time. She appears well-developed and well-nourished.  HENT:  Right Ear: External ear and ear canal normal. Tympanic membrane is not  erythematous and not retracted.  Left Ear: External ear and ear canal normal. Tympanic membrane is not erythematous and not retracted.  Nose: Right sinus exhibits maxillary sinus tenderness and frontal sinus tenderness. Left sinus exhibits maxillary sinus tenderness and frontal sinus tenderness.  Mouth/Throat: Uvula is midline and mucous membranes are normal. No oral lesions. No oropharyngeal exudate or posterior oropharyngeal erythema.  Neck: Normal range of motion. Neck supple.  Cardiovascular: Normal rate, regular rhythm and normal heart sounds.  Pulmonary/Chest: She has wheezes. She has no rales.  Lymphadenopathy:    She has no cervical adenopathy.  Neurological: She is alert and oriented to person, place, and time.    BP 98/68   Pulse 87   Temp 97.9 F (36.6 C) (Oral)   Ht 5\' 7"  (1.702 m)   Wt 117 lb (53.1 kg)   SpO2 98%   BMI 18.32 kg/m   Assessment and Plan: 1. Acute non-recurrent maxillary sinusitis - doxycycline (VIBRA-TABS) 100 MG tablet; Take 1 tablet (100 mg total) by mouth 2 (two) times daily for 10 days.  Dispense: 20 tablet; Refill: 0  2. Cough Continue albuterol as needed - doxycycline (VIBRA-TABS) 100 MG tablet; Take 1 tablet (100 mg total) by mouth 2 (two) times daily for 10 days.  Dispense: 20 tablet; Refill: 0   Meds ordered this encounter  Medications  . doxycycline (VIBRA-TABS) 100 MG tablet    Sig: Take 1 tablet (100 mg total) by mouth 2 (two) times daily for 10 days.    Dispense:  20 tablet    Refill:  0    Partially dictated using Editor, commissioning. Any errors are unintentional.  Halina Maidens, MD [facility]  09/09/2017

## 2017-10-22 ENCOUNTER — Other Ambulatory Visit: Payer: Self-pay | Admitting: Internal Medicine

## 2017-12-27 ENCOUNTER — Other Ambulatory Visit: Payer: Self-pay | Admitting: Internal Medicine

## 2017-12-27 DIAGNOSIS — F324 Major depressive disorder, single episode, in partial remission: Secondary | ICD-10-CM

## 2018-01-24 ENCOUNTER — Encounter: Payer: Self-pay | Admitting: Internal Medicine

## 2018-01-24 ENCOUNTER — Ambulatory Visit (INDEPENDENT_AMBULATORY_CARE_PROVIDER_SITE_OTHER): Payer: Medicare Other | Admitting: Internal Medicine

## 2018-01-24 VITALS — BP 112/78 | HR 94 | Ht 67.0 in | Wt 110.4 lb

## 2018-01-24 DIAGNOSIS — F332 Major depressive disorder, recurrent severe without psychotic features: Secondary | ICD-10-CM | POA: Diagnosis not present

## 2018-01-24 DIAGNOSIS — F411 Generalized anxiety disorder: Secondary | ICD-10-CM | POA: Diagnosis not present

## 2018-01-24 MED ORDER — BUPROPION HCL ER (XL) 150 MG PO TB24: 150.0000 mg | ORAL_TABLET | Freq: Every day | ORAL | 1 refills | Status: DC

## 2018-01-24 NOTE — Progress Notes (Signed)
Date:  01/24/2018   Name:  Carolyn Dunlap   DOB:  [birth year]   MRN:  [id number]   Chief Complaint: Anxiety (PHQ9- 20 ---- GAD7- 20)  Anxiety  Presents for follow-up visit. Symptoms include depressed mood, excessive worry, nervous/anxious behavior, palpitations and restlessness. Patient reports no chest pain, dizziness, shortness of breath or suicidal ideas. Symptoms occur constantly. The severity of symptoms is causing significant distress. The quality of sleep is fair. Nighttime awakenings: one to two.    She has been under much stress recently - her grandson was taken from her son by SS.  Her mother passed away in 11-09-22.  She has been much more anxious with occasional panic attacks for which she takes clonazepam PRN.  She is sleeping fairly well with trazodone and intermittent ambien.  Her ex-husband has been very supportive and he is the one who encouraged her to come here today.  Review of Systems  Constitutional: Positive for appetite change and unexpected weight change. Negative for chills, fatigue and fever.  HENT: Positive for postnasal drip. Negative for trouble swallowing.   Eyes: Negative for visual disturbance.  Respiratory: Negative for choking, chest tightness and shortness of breath.   Cardiovascular: Positive for palpitations. Negative for chest pain.  Gastrointestinal: Negative for abdominal pain, blood in stool and constipation.  Allergic/Immunologic: Positive for environmental allergies.  Neurological: Negative for dizziness, light-headedness and headaches.  Psychiatric/Behavioral: Positive for dysphoric mood and sleep disturbance. Negative for suicidal ideas. The patient is nervous/anxious.     Patient Active Problem List   Diagnosis Date Noted  . Special screening for malignant neoplasms, colon   . Tobacco use disorder 05/29/2016  . Migraine without aura and without status migrainosus, not intractable 03/28/2015  . GERD without esophagitis 03/28/2015  .  Osteoporosis 03/28/2015  . Insomnia 03/28/2015  . Major depression in partial remission ([place]) 03/28/2015  . Anxiety disorder 03/28/2015    Allergies  Allergen Reactions  . Zoloft [Sertraline Hcl]     diaphoresis  . Adhesive [Tape] Rash    Band-aids  . Penicillins Rash    Past Surgical History:  Procedure Laterality Date  . AUGMENTATION MAMMAPLASTY Bilateral 1976  . BREAST ENHANCEMENT SURGERY  1976  . CATARACT EXTRACTION  2002  . COLONOSCOPY    . COLONOSCOPY WITH PROPOFOL N/A 07/10/2017   Procedure: COLONOSCOPY WITH PROPOFOL;  Surgeon: Lin Landsman, MD;  Location: [place];  Service: Endoscopy;  Laterality: N/A;  . POLYPECTOMY N/A 07/10/2017   Procedure: POLYPECTOMY;  Surgeon: Lin Landsman, MD;  Location: [place];  Service: Endoscopy;  Laterality: N/A;    Social History   Tobacco Use  . Smoking status: Current Every Day Smoker    Packs/day: 0.50    Years: 20.00    Pack years: 10.00    Types: Cigarettes  . Smokeless tobacco: Never Used  Substance Use Topics  . Alcohol use: Yes    Alcohol/week: 4.0 standard drinks    Types: 4 Glasses of wine per week  . Drug use: No     Medication list has been reviewed and updated.  Current Meds  Medication Sig  . Calcium-Magnesium-Vitamin D (CALCIUM 500 PO) Take by mouth.  . escitalopram (LEXAPRO) 20 MG tablet TAKE 1 TABLET DAILY  . Multiple Vitamin (MULTI-DAY PO) Take by mouth.  . SUMAtriptan (IMITREX) 100 MG tablet TAKE 1 TABLET DAILY AS     DIRECTED BY YOUR DOCTOR  . traZODone (DESYREL) 50 MG tablet TAKE 1 TABLET AT  BEDTIME  . zolpidem (AMBIEN) 10 MG tablet TAKE 1 TABLET AT BEDTIME.  START DATE: 06/22/2017    PHQ 2/9 Scores 01/24/2018 06/25/2017 05/29/2016  PHQ - 2 Score 6 0 4  PHQ- 9 Score 20 0 9   GAD 7 : Generalized Anxiety Score 01/24/2018  Nervous, Anxious, on Edge 3  Control/stop worrying 3  Worry too much - different things 3  Trouble relaxing 3  Restless 3  Easily annoyed or  irritable 2  Afraid - awful might happen 3  Total GAD 7 Score 20  Anxiety Difficulty Extremely difficult      Physical Exam  Constitutional: She is oriented to person, place, and time. She appears well-developed. She appears distressed.  HENT:  Head: Normocephalic and atraumatic.  Cardiovascular: Normal rate, regular rhythm and normal heart sounds.  Pulmonary/Chest: Effort normal and breath sounds normal. No respiratory distress.  Musculoskeletal: Normal range of motion.  Neurological: She is alert and oriented to person, place, and time.  Skin: Skin is warm and dry. No rash noted.  Psychiatric: Her speech is normal and behavior is normal. Judgment and thought content normal. Her mood appears anxious. Thought content is not delusional. Cognition and memory are normal. She exhibits a depressed mood. She expresses no suicidal ideation. She expresses no suicidal plans.  Nursing note and vitals reviewed.   BP 112/78   Pulse 94   Ht 5\' 7"  (1.702 m)   Wt 110 lb 6.4 oz (50.1 kg)   SpO2 99%   BMI 17.29 kg/m   Assessment and Plan: 1. Generalized anxiety disorder Continue lexapro and trazodone - buPROPion (WELLBUTRIN XL) 150 MG 24 hr tablet; Take 1 tablet (150 mg total) by mouth daily.  Dispense: 30 tablet; Refill: 1  2. Moderately severe recurrent major depression ([place]) Continue Lexapro and trazodone Follow up in one month   Meds ordered this encounter  Medications  . buPROPion (WELLBUTRIN XL) 150 MG 24 hr tablet    Sig: Take 1 tablet (150 mg total) by mouth daily.    Dispense:  30 tablet    Refill:  1    Partially dictated using Editor, commissioning. Any errors are unintentional.  Halina Maidens, MD [facility]  01/24/2018

## 2018-02-16 ENCOUNTER — Other Ambulatory Visit: Payer: Self-pay | Admitting: Internal Medicine

## 2018-02-24 ENCOUNTER — Ambulatory Visit: Payer: Self-pay | Admitting: Internal Medicine

## 2018-03-11 ENCOUNTER — Encounter: Payer: Self-pay | Admitting: Internal Medicine

## 2018-03-11 ENCOUNTER — Ambulatory Visit (INDEPENDENT_AMBULATORY_CARE_PROVIDER_SITE_OTHER): Payer: Medicare Other | Admitting: Internal Medicine

## 2018-03-11 VITALS — BP 122/78 | HR 79 | Ht 67.0 in | Wt 108.8 lb

## 2018-03-11 DIAGNOSIS — F411 Generalized anxiety disorder: Secondary | ICD-10-CM | POA: Diagnosis not present

## 2018-03-11 DIAGNOSIS — F332 Major depressive disorder, recurrent severe without psychotic features: Secondary | ICD-10-CM

## 2018-03-11 NOTE — Progress Notes (Signed)
Date:  03/11/2018   Name:  Carolyn Dunlap   DOB:  1951/01/02   MRN:  [id number]   Chief Complaint: Anxiety (PHQ9- 20 --- GAD7- 18)  Anxiety  Presents for follow-up visit. Symptoms include excessive worry, insomnia and nervous/anxious behavior. Patient reports no chest pain, palpitations, shortness of breath or suicidal ideas. Symptoms occur constantly. The severity of symptoms is causing significant distress.    Depression         This is a chronic problem.  The problem occurs constantly.The problem is unchanged.  Associated symptoms include insomnia and appetite change.  Associated symptoms include no fatigue and no suicidal ideas.  Past treatments include SSRIs - Selective serotonin reuptake inhibitors, other medications and TCAs - Tricyclic antidepressants.  Compliance with treatment is good.  Previous treatment provided no relief relief.  Past medical history includes anxiety.     Review of Systems  Constitutional: Positive for appetite change. Negative for chills and fatigue.  Respiratory: Negative for chest tightness and shortness of breath.   Cardiovascular: Negative for chest pain and palpitations.  Psychiatric/Behavioral: Positive for depression, dysphoric mood and sleep disturbance. Negative for suicidal ideas. The patient is nervous/anxious and has insomnia.     Patient Active Problem List   Diagnosis Date Noted  . Special screening for malignant neoplasms, colon   . Tobacco use disorder 05/29/2016  . Migraine without aura and without status migrainosus, not intractable 03/28/2015  . GERD without esophagitis 03/28/2015  . Osteoporosis 03/28/2015  . Insomnia 03/28/2015  . Moderately severe recurrent major depression ([place]) 03/28/2015  . Anxiety disorder 03/28/2015    Allergies  Allergen Reactions  . Zoloft [Sertraline Hcl]     diaphoresis  . Adhesive [Tape] Rash    Band-aids  . Penicillins Rash    Past Surgical History:  Procedure Laterality Date  .  AUGMENTATION MAMMAPLASTY Bilateral 1976  . BREAST ENHANCEMENT SURGERY  1976  . CATARACT EXTRACTION  2002  . COLONOSCOPY    . COLONOSCOPY WITH PROPOFOL N/A 07/10/2017   Procedure: COLONOSCOPY WITH PROPOFOL;  Surgeon: Lin Landsman, MD;  Location: [facility];  Service: Endoscopy;  Laterality: N/A;  . POLYPECTOMY N/A 07/10/2017   Procedure: POLYPECTOMY;  Surgeon: Lin Landsman, MD;  Location: [facility];  Service: Endoscopy;  Laterality: N/A;    Social History   Tobacco Use  . Smoking status: Current Every Day Smoker    Packs/day: 0.50    Years: 20.00    Pack years: 10.00    Types: Cigarettes  . Smokeless tobacco: Never Used  Substance Use Topics  . Alcohol use: Yes    Alcohol/week: 4.0 standard drinks    Types: 4 Glasses of wine per week  . Drug use: No     Medication list has been reviewed and updated.  Current Meds  Medication Sig  . buPROPion (WELLBUTRIN XL) 150 MG 24 hr tablet Take 1 tablet (150 mg total) by mouth daily.  . Calcium-Magnesium-Vitamin D (CALCIUM 500 PO) Take by mouth.  . escitalopram (LEXAPRO) 20 MG tablet TAKE 1 TABLET DAILY  . Multiple Vitamin (MULTI-DAY PO) Take by mouth.  . SUMAtriptan (IMITREX) 100 MG tablet TAKE 1 TABLET DAILY AS     DIRECTED BY YOUR DOCTOR  . traZODone (DESYREL) 50 MG tablet TAKE 1 TABLET AT BEDTIME  . zolpidem (AMBIEN) 10 MG tablet TAKE 1 TABLET AT BEDTIME.    PHQ 2/9 Scores 03/11/2018 01/24/2018 06/25/2017 05/29/2016  PHQ - 2 Score 6 6 0 4  PHQ- 9 Score 20 20 0 9   GAD 7 : Generalized Anxiety Score 03/11/2018 01/24/2018  Nervous, Anxious, on Edge 3 3  Control/stop worrying 3 3  Worry too much - different things 3 3  Trouble relaxing 3 3  Restless 3 3  Easily annoyed or irritable 0 2  Afraid - awful might happen 3 3  Total GAD 7 Score 18 20  Anxiety Difficulty Extremely difficult Extremely difficult     Physical Exam  Constitutional: She is oriented to person, place, and time. She appears  well-developed. No distress.  HENT:  Head: Normocephalic and atraumatic.  Neck: Normal range of motion. Neck supple.  Cardiovascular: Normal rate, regular rhythm and normal heart sounds.  Pulmonary/Chest: Effort normal and breath sounds normal. No respiratory distress.  Musculoskeletal: Normal range of motion.  Neurological: She is alert and oriented to person, place, and time.  Skin: Skin is warm and dry. No rash noted.  Psychiatric: Her behavior is normal. Thought content normal. She exhibits a depressed mood. She expresses no suicidal ideation. She expresses no suicidal plans.  Nursing note and vitals reviewed.   BP 122/78 (BP Location: Right Arm, Patient Position: Sitting, Cuff Size: Normal)   Pulse 79   Ht 5\' 7"  (1.702 m)   Wt 108 lb 12.8 oz (49.4 kg)   SpO2 98%   BMI 17.04 kg/m   Assessment and Plan: 1. Moderately severe recurrent major depression ([place]) Continue current medications but need to see Psych  2. Generalized anxiety disorder Continue bupropion   Partially dictated using Editor, commissioning. Any errors are unintentional.  Halina Maidens, MD [facility]  03/11/2018

## 2018-03-11 NOTE — Patient Instructions (Signed)
[facility] in [place]: [phone]

## 2018-03-18 ENCOUNTER — Telehealth: Payer: Self-pay

## 2018-03-18 NOTE — Telephone Encounter (Signed)
Patient approved through covermymeds. No Auth code was given. Called and informed patient.

## 2018-03-18 NOTE — Telephone Encounter (Signed)
Patient called stating prior auth needed to be done for Ambien 10 mg.   Did PA with [email or web address] and awaiting outcome. Can take up to 72 hours. Patient informed and will call her with outcome when it comes in.

## 2018-04-07 DIAGNOSIS — Z79899 Other long term (current) drug therapy: Secondary | ICD-10-CM | POA: Diagnosis not present

## 2018-04-07 DIAGNOSIS — F332 Major depressive disorder, recurrent severe without psychotic features: Secondary | ICD-10-CM | POA: Diagnosis not present

## 2018-04-07 DIAGNOSIS — F17210 Nicotine dependence, cigarettes, uncomplicated: Secondary | ICD-10-CM | POA: Diagnosis not present

## 2018-05-02 DIAGNOSIS — F332 Major depressive disorder, recurrent severe without psychotic features: Secondary | ICD-10-CM | POA: Diagnosis not present

## 2018-05-02 DIAGNOSIS — F17210 Nicotine dependence, cigarettes, uncomplicated: Secondary | ICD-10-CM | POA: Diagnosis not present

## 2018-06-16 DIAGNOSIS — F17210 Nicotine dependence, cigarettes, uncomplicated: Secondary | ICD-10-CM | POA: Diagnosis not present

## 2018-06-16 DIAGNOSIS — F332 Major depressive disorder, recurrent severe without psychotic features: Secondary | ICD-10-CM | POA: Diagnosis not present

## 2018-06-25 ENCOUNTER — Encounter: Payer: Self-pay | Admitting: Internal Medicine

## 2018-06-25 ENCOUNTER — Ambulatory Visit (INDEPENDENT_AMBULATORY_CARE_PROVIDER_SITE_OTHER): Payer: Medicare Other | Admitting: Internal Medicine

## 2018-06-25 VITALS — BP 112/68 | HR 63 | Ht 67.0 in | Wt 111.0 lb

## 2018-06-25 DIAGNOSIS — R2681 Unsteadiness on feet: Secondary | ICD-10-CM

## 2018-06-25 DIAGNOSIS — G4700 Insomnia, unspecified: Secondary | ICD-10-CM

## 2018-06-25 DIAGNOSIS — K219 Gastro-esophageal reflux disease without esophagitis: Secondary | ICD-10-CM | POA: Diagnosis not present

## 2018-06-25 DIAGNOSIS — R251 Tremor, unspecified: Secondary | ICD-10-CM | POA: Insufficient documentation

## 2018-06-25 DIAGNOSIS — G43009 Migraine without aura, not intractable, without status migrainosus: Secondary | ICD-10-CM | POA: Diagnosis not present

## 2018-06-25 DIAGNOSIS — Z1231 Encounter for screening mammogram for malignant neoplasm of breast: Secondary | ICD-10-CM

## 2018-06-25 DIAGNOSIS — F332 Major depressive disorder, recurrent severe without psychotic features: Secondary | ICD-10-CM | POA: Diagnosis not present

## 2018-06-25 DIAGNOSIS — W19XXXA Unspecified fall, initial encounter: Secondary | ICD-10-CM | POA: Diagnosis not present

## 2018-06-25 LAB — POCT URINALYSIS DIPSTICK
Bilirubin, UA: NEGATIVE
Glucose, UA: NEGATIVE
Ketones, UA: NEGATIVE
LEUKOCYTES UA: NEGATIVE
NITRITE UA: NEGATIVE
PROTEIN UA: NEGATIVE
Spec Grav, UA: 1.02 (ref 1.010–1.025)
Urobilinogen, UA: 0.2 E.U./dL
pH, UA: 6.5 (ref 5.0–8.0)

## 2018-06-25 NOTE — Progress Notes (Addendum)
Date:  06/25/2018   Name:  Carolyn Dunlap   DOB:  05/06/1951   MRN:  [id number]   Chief Complaint: Annual Exam (Breast Exam. No more paps.); Fall (Last time fell was yesterday. Was with someone when it happened. Did not go unconsiouc but feels like BP is dropping.); and Depression (See's doctor for this. PHQ9- 18) Pt is here for annual check up.  She mostly stays at home, very sedentary.  Occasional house work or meal prep.  No exercise.  Appetite unchanged.  Now on additional medications from psych for severe depression. Fall  The fall occurred while standing (has fallen several times while standing/walking). She landed on carpet. Point of impact: variable - often able to fall onto sofa. The symptoms are aggravated by ambulation. Pertinent negatives include no fever, headaches, hematuria, loss of consciousness, visual change or vomiting. Associated symptoms comments: She falls without any warning - thinks maybe her BP gets low but no prodrome noted. Treatments tried: eating more salt iand increasing fluids.  Depression         This is a chronic problem.  The problem has been gradually improving since onset.  Associated symptoms include fatigue and insomnia.  Associated symptoms include no headaches and no suicidal ideas.  Treatments tried: multiple medications prescribed by Psych.  Compliance with treatment is good. Migraine   This is a recurrent problem. The problem occurs seasonly. The problem has been unchanged. The pain quality is similar to prior headaches. The quality of the pain is described as aching. Associated symptoms include insomnia and weakness. Pertinent negatives include no coughing, dizziness, fever, sore throat, visual change or vomiting. She has tried NSAIDs and triptans for the symptoms. The treatment provided significant relief.  Gastroesophageal Reflux  She reports no chest pain, no coughing, no sore throat or no wheezing. This is a recurrent problem. The problem occurs  occasionally. Associated symptoms include fatigue. She has tried nothing for the symptoms.  Insomnia  Primary symptoms: sleep disturbance.  The problem occurs nightly. The problem is unchanged. Past treatments include medication (ambien and trazodone). PMH includes: depression.    Review of Systems  Constitutional: Positive for fatigue. Negative for chills, diaphoresis, fever and unexpected weight change.  HENT: Negative for postnasal drip, sore throat and trouble swallowing.   Eyes: Negative for visual disturbance.  Respiratory: Negative for cough, chest tightness, shortness of breath and wheezing.   Cardiovascular: Negative for chest pain, palpitations and leg swelling.  Gastrointestinal: Negative for blood in stool, constipation and vomiting.  Endocrine: Negative for polydipsia and polyuria.  Genitourinary: Negative for dysuria and hematuria.  Neurological: Positive for tremors and weakness. Negative for dizziness, loss of consciousness, light-headedness and headaches.       Several falls  Hematological: Negative for adenopathy.  Psychiatric/Behavioral: Positive for depression, dysphoric mood and sleep disturbance. Negative for suicidal ideas. The patient is nervous/anxious and has insomnia.     Patient Active Problem List   Diagnosis Date Noted  . Tremor, unspecified 06/25/2018  . Special screening for malignant neoplasms, colon   . Tobacco use disorder 05/29/2016  . Migraine without aura and without status migrainosus, not intractable 03/28/2015  . GERD without esophagitis 03/28/2015  . Osteoporosis 03/28/2015  . Insomnia 03/28/2015  . Moderately severe recurrent major depression ([place]) 03/28/2015  . Anxiety disorder 03/28/2015    Allergies  Allergen Reactions  . Zoloft [Sertraline Hcl]     diaphoresis  . Adhesive [Tape] Rash    Band-aids  . Penicillins Rash  Past Surgical History:  Procedure Laterality Date  . AUGMENTATION MAMMAPLASTY Bilateral 1976  . BREAST  ENHANCEMENT SURGERY  1976  . CATARACT EXTRACTION  2002  . COLONOSCOPY WITH PROPOFOL N/A 07/10/2017   Procedure: COLONOSCOPY WITH PROPOFOL;  Surgeon: Lin Landsman, MD;  Location: [facility];  Service: Endoscopy;  Laterality: N/A;  . POLYPECTOMY N/A 07/10/2017   Procedure: POLYPECTOMY;  Surgeon: Lin Landsman, MD;  Location: [facility];  Service: Endoscopy;  Laterality: N/A;    Social History   Tobacco Use  . Smoking status: Current Every Day Smoker    Packs/day: 0.50    Years: 20.00    Pack years: 10.00    Types: Cigarettes  . Smokeless tobacco: Never Used  Substance Use Topics  . Alcohol use: Yes    Alcohol/week: 4.0 standard drinks    Types: 4 Glasses of wine per week  . Drug use: No     Medication list has been reviewed and updated.  Current Meds  Medication Sig  . buPROPion (WELLBUTRIN XL) 150 MG 24 hr tablet Take 1 tablet (150 mg total) by mouth daily. (Patient taking differently: Take 300 mg by mouth daily. )  . Calcium-Magnesium-Vitamin D (CALCIUM 500 PO) Take by mouth.  . DULoxetine (CYMBALTA) 30 MG capsule Take 90 mg by mouth daily.  Marland Kitchen escitalopram (LEXAPRO) 20 MG tablet TAKE 1 TABLET DAILY  . Multiple Vitamin (MULTI-DAY PO) Take by mouth.  . SUMAtriptan (IMITREX) 100 MG tablet TAKE 1 TABLET DAILY AS     DIRECTED BY YOUR DOCTOR  . traZODone (DESYREL) 50 MG tablet TAKE 1 TABLET AT BEDTIME (Patient taking differently: Take 100 mg by mouth at bedtime. Take 1.5 to 2 tablets at bedtime as needed.)  . zolpidem (AMBIEN) 10 MG tablet TAKE 1 TABLET AT BEDTIME.    PHQ 2/9 Scores 06/25/2018 03/11/2018 01/24/2018 06/25/2017  PHQ - 2 Score 6 6 6  0  PHQ- 9 Score 18 20 20  0   Fall Risk  06/25/2018 06/25/2017 05/29/2016  Falls in the past year? 1 No No  Number falls in past yr: 1 - -  Injury with Fall? 1 - -  Risk for fall due to : History of fall(s);Impaired balance/gait;Other (Comment) - -  Risk for fall due to: Comment LOW BP - -   Sitting BP  140/82 Standing BP 136/80  Physical Exam Vitals signs and nursing note reviewed.  Constitutional:      General: She is not in acute distress.    Appearance: She is well-developed and underweight.  HENT:     Head: Normocephalic and atraumatic.     Right Ear: Tympanic membrane and ear canal normal.     Left Ear: Tympanic membrane and ear canal normal.     Nose:     Right Sinus: No maxillary sinus tenderness.     Left Sinus: No maxillary sinus tenderness.     Mouth/Throat:     Pharynx: Uvula midline.  Eyes:     General: No scleral icterus.       Right eye: No discharge.        Left eye: No discharge.     Conjunctiva/sclera: Conjunctivae normal.  Neck:     Musculoskeletal: Normal range of motion. No erythema.     Thyroid: No thyromegaly or thyroid tenderness.     Vascular: No carotid bruit.  Cardiovascular:     Rate and Rhythm: Normal rate and regular rhythm.     Pulses: Normal pulses.  Heart sounds: Normal heart sounds. No murmur.  Pulmonary:     Effort: Pulmonary effort is normal. No respiratory distress.     Breath sounds: Normal breath sounds. No wheezing.  Chest:     Breasts:        Right: No mass, nipple discharge, skin change or tenderness.        Left: No mass, nipple discharge, skin change or tenderness.     Comments: Very firm non tender implants in both breasts Abdominal:     General: Abdomen is flat. Bowel sounds are normal.     Palpations: Abdomen is soft.     Tenderness: There is no abdominal tenderness.  Musculoskeletal:     Right lower leg: No edema.     Left lower leg: No edema.  Lymphadenopathy:     Cervical: No cervical adenopathy.  Skin:    General: Skin is warm and dry.     Findings: No rash.  Neurological:     Mental Status: She is alert and oriented to person, place, and time.     Cranial Nerves: No cranial nerve deficit.     Sensory: No sensory deficit.     Motor: Tremor present. No seizure activity.     Coordination: Coordination is  intact.     Deep Tendon Reflexes: Reflexes are normal and symmetric.     Comments: Coarse tremor in both UEs at rest and with movement No tremor of head/neck noted  Psychiatric:        Mood and Affect: Mood is anxious. Affect is blunt.        Speech: Speech normal.        Behavior: Behavior normal.        Thought Content: Thought content normal.     BP 112/68 (BP Location: Right Arm, Patient Position: Sitting, Cuff Size: Normal)   Pulse 63   Ht 5\' 7"  (1.702 m)   Wt 111 lb (50.3 kg)   SpO2 99%   BMI 17.39 kg/m   Assessment and Plan: 1. Moderately severe recurrent major depression ([place]) Continue current medication and psych follow up - TSH  2. GERD without esophagitis Stable, on no medication - CBC with Differential/Platelet  3. Migraine without aura and without status migrainosus, not intractable Continue imitrex PRN  4. Tremor, unspecified Appears worsening - likely contributing to falls/balance issues - Ambulatory referral to Neurology  5. Insomnia, unspecified type Chronic, continue ambien and trazodone  6. Gait instability Suspect multi-factorial but doubt hypotension; mostly likely severe deconditioning + tremor + possible drug effect Would recommend exercise program once seen by Neurology - Ambulatory referral to Neurology - Comprehensive metabolic panel - POCT urinalysis dipstick  7. Encounter for screening mammogram for breast cancer - MM 3D SCREEN BREAST BILATERAL; Future   Partially dictated using Editor, commissioning. Any errors are unintentional.  Halina Maidens, MD [facility]  06/26/2018

## 2018-06-26 LAB — CBC WITH DIFFERENTIAL/PLATELET
BASOS ABS: 0 10*3/uL (ref 0.0–0.2)
BASOS: 0 %
EOS (ABSOLUTE): 0.1 10*3/uL (ref 0.0–0.4)
Eos: 1 %
HEMOGLOBIN: 12.2 g/dL (ref 11.1–15.9)
Hematocrit: 35.4 % (ref 34.0–46.6)
IMMATURE GRANS (ABS): 0 10*3/uL (ref 0.0–0.1)
Immature Granulocytes: 0 %
Lymphocytes Absolute: 1 10*3/uL (ref 0.7–3.1)
Lymphs: 10 %
MCH: 33.7 pg — ABNORMAL HIGH (ref 26.6–33.0)
MCHC: 34.5 g/dL (ref 31.5–35.7)
MCV: 98 fL — AB (ref 79–97)
MONOS ABS: 0.4 10*3/uL (ref 0.1–0.9)
Monocytes: 4 %
NEUTROS ABS: 8.1 10*3/uL — AB (ref 1.4–7.0)
NEUTROS PCT: 85 %
PLATELETS: 288 10*3/uL (ref 150–450)
RBC: 3.62 x10E6/uL — ABNORMAL LOW (ref 3.77–5.28)
RDW: 13.6 % (ref 11.7–15.4)
WBC: 9.6 10*3/uL (ref 3.4–10.8)

## 2018-06-26 LAB — COMPREHENSIVE METABOLIC PANEL
ALT: 12 IU/L (ref 0–32)
AST: 21 IU/L (ref 0–40)
Albumin/Globulin Ratio: 1.6 (ref 1.2–2.2)
Albumin: 4.1 g/dL (ref 3.6–4.8)
Alkaline Phosphatase: 70 IU/L (ref 39–117)
BUN/Creatinine Ratio: 8 — ABNORMAL LOW (ref 12–28)
BUN: 5 mg/dL — AB (ref 8–27)
Bilirubin Total: 0.3 mg/dL (ref 0.0–1.2)
CALCIUM: 8.8 mg/dL (ref 8.7–10.3)
CO2: 22 mmol/L (ref 20–29)
Chloride: 98 mmol/L (ref 96–106)
Creatinine, Ser: 0.62 mg/dL (ref 0.57–1.00)
GFR calc Af Amer: 109 mL/min/{1.73_m2} (ref >59)
GFR, EST NON AFRICAN AMERICAN: 94 mL/min/{1.73_m2} (ref >59)
GLUCOSE: 90 mg/dL (ref 65–99)
Globulin, Total: 2.5 g/dL (ref 1.5–4.5)
Potassium: 4 mmol/L (ref 3.5–5.2)
Sodium: 136 mmol/L (ref 134–144)
Total Protein: 6.6 g/dL (ref 6.0–8.5)

## 2018-06-26 LAB — TSH: TSH: 3.22 u[IU]/mL (ref 0.450–4.500)

## 2018-07-14 DIAGNOSIS — F332 Major depressive disorder, recurrent severe without psychotic features: Secondary | ICD-10-CM | POA: Diagnosis not present

## 2018-07-14 DIAGNOSIS — F17210 Nicotine dependence, cigarettes, uncomplicated: Secondary | ICD-10-CM | POA: Diagnosis not present

## 2018-07-22 DIAGNOSIS — L570 Actinic keratosis: Secondary | ICD-10-CM | POA: Diagnosis not present

## 2018-07-22 DIAGNOSIS — Z1283 Encounter for screening for malignant neoplasm of skin: Secondary | ICD-10-CM | POA: Diagnosis not present

## 2018-07-22 DIAGNOSIS — Z872 Personal history of diseases of the skin and subcutaneous tissue: Secondary | ICD-10-CM | POA: Diagnosis not present

## 2018-07-22 DIAGNOSIS — L578 Other skin changes due to chronic exposure to nonionizing radiation: Secondary | ICD-10-CM | POA: Diagnosis not present

## 2018-07-22 DIAGNOSIS — L821 Other seborrheic keratosis: Secondary | ICD-10-CM | POA: Diagnosis not present

## 2018-07-24 DIAGNOSIS — R42 Dizziness and giddiness: Secondary | ICD-10-CM | POA: Diagnosis not present

## 2018-07-24 DIAGNOSIS — R296 Repeated falls: Secondary | ICD-10-CM | POA: Insufficient documentation

## 2018-07-24 DIAGNOSIS — R2689 Other abnormalities of gait and mobility: Secondary | ICD-10-CM | POA: Diagnosis not present

## 2018-07-29 DIAGNOSIS — R42 Dizziness and giddiness: Secondary | ICD-10-CM | POA: Insufficient documentation

## 2018-07-29 DIAGNOSIS — R2689 Other abnormalities of gait and mobility: Secondary | ICD-10-CM | POA: Insufficient documentation

## 2018-08-07 DIAGNOSIS — R2689 Other abnormalities of gait and mobility: Secondary | ICD-10-CM | POA: Diagnosis not present

## 2018-08-07 DIAGNOSIS — R42 Dizziness and giddiness: Secondary | ICD-10-CM | POA: Diagnosis not present

## 2018-08-07 DIAGNOSIS — R296 Repeated falls: Secondary | ICD-10-CM | POA: Diagnosis not present

## 2018-08-11 DIAGNOSIS — F332 Major depressive disorder, recurrent severe without psychotic features: Secondary | ICD-10-CM | POA: Diagnosis not present

## 2018-08-11 DIAGNOSIS — F17210 Nicotine dependence, cigarettes, uncomplicated: Secondary | ICD-10-CM | POA: Diagnosis not present

## 2018-10-31 ENCOUNTER — Other Ambulatory Visit: Payer: Self-pay | Admitting: Internal Medicine

## 2018-11-26 DIAGNOSIS — F17210 Nicotine dependence, cigarettes, uncomplicated: Secondary | ICD-10-CM | POA: Diagnosis not present

## 2018-11-26 DIAGNOSIS — F332 Major depressive disorder, recurrent severe without psychotic features: Secondary | ICD-10-CM | POA: Diagnosis not present

## 2019-01-01 ENCOUNTER — Other Ambulatory Visit: Payer: Self-pay

## 2019-01-01 ENCOUNTER — Ambulatory Visit (INDEPENDENT_AMBULATORY_CARE_PROVIDER_SITE_OTHER): Payer: Medicare Other | Admitting: Internal Medicine

## 2019-01-01 ENCOUNTER — Encounter: Payer: Self-pay | Admitting: Internal Medicine

## 2019-01-01 VITALS — BP 86/60 | HR 68 | Ht 67.0 in | Wt 115.0 lb

## 2019-01-01 DIAGNOSIS — M62838 Other muscle spasm: Secondary | ICD-10-CM

## 2019-01-01 DIAGNOSIS — G8929 Other chronic pain: Secondary | ICD-10-CM | POA: Diagnosis not present

## 2019-01-01 DIAGNOSIS — M25512 Pain in left shoulder: Secondary | ICD-10-CM

## 2019-01-01 DIAGNOSIS — K219 Gastro-esophageal reflux disease without esophagitis: Secondary | ICD-10-CM

## 2019-01-01 MED ORDER — CYCLOBENZAPRINE HCL 10 MG PO TABS: 10.0000 mg | ORAL_TABLET | Freq: Every day | ORAL | 1 refills | Status: DC

## 2019-01-01 MED ORDER — FAMOTIDINE 20 MG PO TABS: 20.0000 mg | ORAL_TABLET | Freq: Two times a day (BID) | ORAL | 1 refills | Status: DC

## 2019-01-01 NOTE — Patient Instructions (Signed)
Tylenol 1000 mg three times a day as needed  Continue heat or ice as needed

## 2019-01-01 NOTE — Progress Notes (Signed)
Date:  01/01/2019   Name:  Carolyn Dunlap   DOB:  02/09/1951   MRN:  [id number]   Chief Complaint: Neck Pain (Shoulders, neck and sternum. Once weekly for the last month. Starts and lasts for 3 days and then goes away. Last week was longer than usual. Feels pressure to burp in sternum and shoulders. )  Shoulder Pain  The pain is present in the left shoulder and right shoulder. This is a new problem. The current episode started 1 to 4 weeks ago. The problem occurs intermittently. The problem has been unchanged. The quality of the pain is described as aching. The pain is moderate. Associated symptoms include a limited range of motion. Pertinent negatives include no fever, numbness or stiffness. The symptoms are aggravated by activity. She has tried NSAIDS for the symptoms. The treatment provided mild relief.  Gastroesophageal Reflux She complains of abdominal pain, belching and heartburn. She reports no chest pain, no coughing, no nausea or no wheezing. This is a recurrent problem. The problem has been gradually worsening (over the past month). Pertinent negatives include no fatigue.    Review of Systems  Constitutional: Negative for chills, fatigue and fever.  Respiratory: Negative for cough, chest tightness, shortness of breath and wheezing.   Cardiovascular: Negative for chest pain, palpitations and leg swelling.  Gastrointestinal: Positive for abdominal pain and heartburn. Negative for constipation, diarrhea and nausea.  Musculoskeletal: Positive for arthralgias (left shoulder) and myalgias (across shoulders and upper back). Negative for gait problem, joint swelling and stiffness.  Skin: Negative for rash.  Allergic/Immunologic: Negative for environmental allergies.  Neurological: Negative for weakness and numbness.  Psychiatric/Behavioral: Negative for sleep disturbance. The patient is not nervous/anxious.     Patient Active Problem List   Diagnosis Date Noted  . Tremor, unspecified  06/25/2018  . Special screening for malignant neoplasms, colon   . Tobacco use disorder 05/29/2016  . Migraine without aura and without status migrainosus, not intractable 03/28/2015  . GERD without esophagitis 03/28/2015  . Osteoporosis 03/28/2015  . Insomnia 03/28/2015  . Moderately severe recurrent major depression ([place]) 03/28/2015  . Anxiety disorder 03/28/2015    Allergies  Allergen Reactions  . Zoloft [Sertraline Hcl]     diaphoresis  . Adhesive [Tape] Rash    Band-aids  . Penicillins Rash    Past Surgical History:  Procedure Laterality Date  . AUGMENTATION MAMMAPLASTY Bilateral 1976  . BREAST ENHANCEMENT SURGERY  1976  . CATARACT EXTRACTION  2002  . COLONOSCOPY WITH PROPOFOL N/A 07/10/2017   Procedure: COLONOSCOPY WITH PROPOFOL;  Surgeon: Lin Landsman, MD;  Location: [facility];  Service: Endoscopy;  Laterality: N/A;  . POLYPECTOMY N/A 07/10/2017   Procedure: POLYPECTOMY;  Surgeon: Lin Landsman, MD;  Location: [facility];  Service: Endoscopy;  Laterality: N/A;    Social History   Tobacco Use  . Smoking status: Current Every Day Smoker    Packs/day: 1.00    Years: 20.00    Pack years: 20.00    Types: Cigarettes  . Smokeless tobacco: Never Used  Substance Use Topics  . Alcohol use: Yes    Alcohol/week: 4.0 standard drinks    Types: 4 Glasses of wine per week  . Drug use: No     Medication list has been reviewed and updated.  Current Meds  Medication Sig  . ARIPiprazole (ABILIFY) 5 MG tablet Take 5 mg by mouth daily.  Marland Kitchen buPROPion (WELLBUTRIN XL) 150 MG 24 hr tablet Take 1 tablet (  150 mg total) by mouth daily. (Patient taking differently: Take 300 mg by mouth daily. )  . Calcium-Magnesium-Vitamin D (CALCIUM 500 PO) Take by mouth.  . Multiple Vitamin (MULTI-DAY PO) Take by mouth.  . SUMAtriptan (IMITREX) 100 MG tablet TAKE 1 TABLET DAILY AS     DIRECTED BY YOUR DOCTOR  . traZODone (DESYREL) 50 MG tablet TAKE 1 TABLET AT BEDTIME  (Patient taking differently: Take 100 mg by mouth at bedtime. Take 1.5 to 2 tablets at bedtime as needed.)  . zolpidem (AMBIEN) 10 MG tablet TAKE 1 TABLET AT BEDTIME.    PHQ 2/9 Scores 01/01/2019 06/25/2018 03/11/2018 01/24/2018  PHQ - 2 Score 1 6 6 6   PHQ- 9 Score 9 18 20 20     BP Readings from Last 3 Encounters:  01/01/19 (!) 86/60  06/25/18 112/68  03/11/18 122/78    Physical Exam Vitals signs and nursing note reviewed.  Constitutional:      General: She is not in acute distress.    Appearance: Normal appearance. She is well-developed.  HENT:     Head: Normocephalic and atraumatic.  Cardiovascular:     Rate and Rhythm: Normal rate and regular rhythm.     Pulses: Normal pulses.     Heart sounds: No murmur.  Pulmonary:     Effort: Pulmonary effort is normal. No respiratory distress.     Breath sounds: No wheezing or rhonchi.  Abdominal:     Tenderness: There is no abdominal tenderness. There is no guarding or rebound.  Musculoskeletal:     Right shoulder: Normal. She exhibits normal range of motion, no tenderness and no bony tenderness.     Left shoulder: She exhibits decreased range of motion, tenderness and bony tenderness.     Cervical back: She exhibits decreased range of motion. She exhibits no tenderness and no bony tenderness.     Right lower leg: No edema.     Left lower leg: No edema.  Lymphadenopathy:     Cervical: No cervical adenopathy.  Skin:    General: Skin is warm and dry.     Findings: No rash.  Neurological:     Mental Status: She is alert and oriented to person, place, and time.     Sensory: Sensation is intact.     Motor: Motor function is intact.     Deep Tendon Reflexes:     Reflex Scores:      Bicep reflexes are 2+ on the right side and 2+ on the left side. Psychiatric:        Attention and Perception: Attention and perception normal.        Mood and Affect: Mood normal.        Behavior: Behavior normal.        Thought Content: Thought content  normal.     Wt Readings from Last 3 Encounters:  01/01/19 115 lb (52.2 kg)  06/25/18 111 lb (50.3 kg)  03/11/18 108 lb 12.8 oz (49.4 kg)    BP (!) 86/60   Pulse 68   Ht 5\' 7"  (1.702 m)   Wt 115 lb (52.2 kg)   SpO2 97%   BMI 18.01 kg/m   Assessment and Plan: 1. Muscle spasm of shoulder region Stop Aleve and use Tylenol and heat If no improvement, may need Xray and Ortho referral - cyclobenzaprine (FLEXERIL) 10 MG tablet; Take 1 tablet (10 mg total) by mouth at bedtime.  Dispense: 30 tablet; Refill: 1  2. Chronic left shoulder pain Suspect mild  AC OA Use tylenol instead of Aleve  3. GERD without esophagitis Stop Aleve Begin Pepcid fo 2-3 weeks then use PRN if sx improved - famotidine (PEPCID) 20 MG tablet; Take 1 tablet (20 mg total) by mouth 2 (two) times daily.  Dispense: 60 tablet; Refill: 1   Partially dictated using Editor, commissioning. Any errors are unintentional.  Halina Maidens, MD [facility]  01/01/2019

## 2019-02-18 DIAGNOSIS — F17210 Nicotine dependence, cigarettes, uncomplicated: Secondary | ICD-10-CM | POA: Diagnosis not present

## 2019-02-18 DIAGNOSIS — F332 Major depressive disorder, recurrent severe without psychotic features: Secondary | ICD-10-CM | POA: Diagnosis not present

## 2019-03-18 ENCOUNTER — Ambulatory Visit (INDEPENDENT_AMBULATORY_CARE_PROVIDER_SITE_OTHER): Payer: Medicare Other

## 2019-03-18 VITALS — BP 109/78 | Ht 67.0 in | Wt 110.0 lb

## 2019-03-18 DIAGNOSIS — Z780 Asymptomatic menopausal state: Secondary | ICD-10-CM

## 2019-03-18 DIAGNOSIS — Z0000 Encounter for general adult medical examination without abnormal findings: Secondary | ICD-10-CM

## 2019-03-18 NOTE — Progress Notes (Signed)
Subjective:   Carolyn Dunlap is a 68 y.o. female who presents for an Initial Medicare Annual Wellness Visit.  Virtual Visit via Telephone Note  I connected with Carolyn Dunlap on 03/18/19 at 10:40 AM EDT by telephone and verified that I am speaking with the correct person using two identifiers.  Medicare Annual Wellness visit completed telephonically due to Covid-19 pandemic.   Location: Patient: home Provider: office   I discussed the limitations, risks, security and privacy concerns of performing an evaluation and management service by telephone and the availability of in person appointments. The patient expressed understanding and agreed to proceed.  Some vital signs may be absent or patient reported.   Carolyn Marker, LPN    Review of Systems      Cardiac Risk Factors include: advanced age (>28men, >69 women);smoking/ tobacco exposure     Objective:    Today's Vitals   03/18/19 1052  BP: 109/78  Weight: 110 lb (49.9 kg)  Height: 5\' 7"  (1.702 m)   Body mass index is 17.23 kg/m.  Advanced Directives 03/18/2019 07/10/2017 05/29/2016 08/09/2015 08/09/2015  Does Patient Have a Medical Advance Directive? Yes Yes No;Yes Yes No  Type of Paramedic of [place];Living will [place];Living will Living will Living will -  Does patient want to make changes to medical advance directive? - No - Patient declined - - -  Copy of [place] in Chart? No - copy requested No - copy requested - - -  Would patient like information on creating a medical advance directive? - - - - No - patient declined information    Current Medications (verified) Outpatient Encounter Medications as of 03/18/2019  Medication Sig  . ARIPiprazole (ABILIFY) 5 MG tablet Take 5 mg by mouth daily.  Marland Kitchen buPROPion (WELLBUTRIN XL) 150 MG 24 hr tablet Take 1 tablet (150 mg total) by mouth daily. (Patient taking differently: Take 300 mg by mouth daily. )  .  Calcium-Magnesium-Vitamin D (CALCIUM 500 PO) Take by mouth.  . cyclobenzaprine (FLEXERIL) 10 MG tablet Take 1 tablet (10 mg total) by mouth at bedtime. (Patient taking differently: Take 10 mg by mouth at bedtime. PRN only)  . famotidine (PEPCID) 20 MG tablet Take 1 tablet (20 mg total) by mouth 2 (two) times daily. (Patient taking differently: Take 20 mg by mouth 2 (two) times daily. PRN only)  . Multiple Vitamin (MULTI-DAY PO) Take by mouth.  . SUMAtriptan (IMITREX) 100 MG tablet TAKE 1 TABLET DAILY AS     DIRECTED BY YOUR DOCTOR  . traZODone (DESYREL) 50 MG tablet TAKE 1 TABLET AT BEDTIME (Patient taking differently: Take 100 mg by mouth at bedtime. Take 1.5 to 2 tablets at bedtime as needed.)  . zolpidem (AMBIEN) 10 MG tablet TAKE 1 TABLET AT BEDTIME.   No facility-administered encounter medications on file as of 03/18/2019.     Allergies (verified) Zoloft [sertraline hcl], Adhesive [tape], and Penicillins   History: Past Medical History:  Diagnosis Date  . Depression   . GERD (gastroesophageal reflux disease)   . Insomnia   . Migraine headache    weekly  . PONV (postoperative nausea and vomiting)    in distant past   Past Surgical History:  Procedure Laterality Date  . AUGMENTATION MAMMAPLASTY Bilateral 1976  . BREAST ENHANCEMENT SURGERY  1976  . CATARACT EXTRACTION  2002  . COLONOSCOPY WITH PROPOFOL N/A 07/10/2017   Procedure: COLONOSCOPY WITH PROPOFOL;  Surgeon: Lin Landsman, MD;  Location: [facility];  Service: Endoscopy;  Laterality: N/A;  . POLYPECTOMY N/A 07/10/2017   Procedure: POLYPECTOMY;  Surgeon: Lin Landsman, MD;  Location: [facility];  Service: Endoscopy;  Laterality: N/A;   Family History  Problem Relation Age of Onset  . Hyperlipidemia Mother   . Hypertension Mother   . Hypertension Father   . Breast cancer Neg Hx    Social History   Socioeconomic History  . Marital status: Divorced    Spouse name: Not on file  . Number of  children: 1  . Years of education: Not on file  . Highest education level: Not on file  Occupational History  . Not on file  Social Needs  . Financial resource strain: Not hard at all  . Food insecurity    Worry: Never true    Inability: Never true  . Transportation needs    Medical: No    Non-medical: No  Tobacco Use  . Smoking status: Current Every Day Smoker    Packs/day: 1.00    Years: 30.00    Pack years: 30.00    Types: Cigarettes  . Smokeless tobacco: Never Used  . Tobacco comment: [place] smoking cessation information provided  Substance and Sexual Activity  . Alcohol use: Yes    Alcohol/week: 4.0 standard drinks    Types: 4 Glasses of wine per week  . Drug use: No  . Sexual activity: Not on file  Lifestyle  . Physical activity    Days per week: 0 days    Minutes per session: 0 min  . Stress: Very much  Relationships  . Social Herbalist on phone: Patient refused    Gets together: Patient refused    Attends religious service: Patient refused    Active member of club or organization: Patient refused    Attends meetings of clubs or organizations: Patient refused    Relationship status: Divorced  Other Topics Concern  . Not on file  Social History Narrative  . Not on file    Tobacco Counseling Ready to quit: No Counseling given: Yes Comment: [place] smoking cessation information provided   Clinical Intake:  Pre-visit preparation completed: Yes  Pain : No/denies pain     BMI - recorded: 17.23 Nutritional Risks: None Diabetes: No  How often do you need to have someone help you when you read instructions, pamphlets, or other written materials from your doctor or pharmacy?: 1 - Never  Interpreter Needed?: No  Information entered by :: Carolyn Marker LPN   Activities of Daily Living In your present state of health, do you have any difficulty performing the following activities: 03/18/2019 01/01/2019  Hearing? N N  Comment declines  hearing aids -  Vision? N N  Difficulty concentrating or making decisions? N N  Walking or climbing stairs? N N  Dressing or bathing? N N  Doing errands, shopping? N N  Preparing Food and eating ? N -  Using the Toilet? N -  In the past six months, have you accidently leaked urine? N -  Do you have problems with loss of bowel control? N -  Managing your Medications? N -  Managing your Finances? N -  Housekeeping or managing your Housekeeping? N -  Some recent data might be hidden     Immunizations and Health Maintenance Immunization History  Administered Date(s) Administered  . Influenza,inj,quad, With Preservative 04/04/2018  . Influenza-Unspecified 03/12/2015, 04/08/2017  . Pneumococcal Conjugate-13 05/26/2015  .  Pneumococcal Polysaccharide-23 05/29/2016  . Zoster 02/09/2011  . Zoster Recombinat (Shingrix) 06/25/2017, 06/10/2018   Health Maintenance Due  Topic Date Due  . MAMMOGRAM  06/07/2017  . INFLUENZA VACCINE  01/10/2019    Patient Care Team: Glean Hess, MD as PCP - General (Internal Medicine) Ulyess Blossom (Psychiatry)  Indicate any recent Medical Services you may have received from other than [facility] providers in the past year (date may be approximate).     Assessment:   This is a routine wellness examination for [place].  Hearing/Vision screen  Hearing Screening   125Hz  250Hz  500Hz  1000Hz  2000Hz  3000Hz  4000Hz  6000Hz  8000Hz   Right ear:           Left ear:           Comments: Pt denies hearing difficulty  Vision Screening Comments: [facility] screenings at [facility], past due for exam  Dietary issues and exercise activities discussed: Current Exercise Habits: The patient does not participate in regular exercise at present, Exercise limited by: None identified  Goals    . Patient Stated     Pt states she would like to manage stress better and hopes her son will be in a better situation.       Depression Screen PHQ 2/9 Scores 03/18/2019 01/01/2019  06/25/2018 03/11/2018 01/24/2018 06/25/2017 05/29/2016  PHQ - 2 Score 6 1 6 6 6  0 4  PHQ- 9 Score 19 9 18 20 20  0 9    Fall Risk Fall Risk  03/18/2019 06/25/2018 06/25/2017 05/29/2016  Falls in the past year? 0 1 No No  Number falls in past yr: 0 1 - -  Injury with Fall? 0 1 - -  Risk for fall due to : - History of fall(s);Impaired balance/gait;Other (Comment) - -  Risk for fall due to: Comment - LOW BP - -  Follow up Falls prevention discussed - - -   FALL RISK PREVENTION PERTAINING TO THE HOME:  Any stairs in or around the home? No  If so, do they handrails? No   Home free of loose throw rugs in walkways, pet beds, electrical cords, etc? Yes  Adequate lighting in your home to reduce risk of falls? Yes   ASSISTIVE DEVICES UTILIZED TO PREVENT FALLS:  Life alert? No  Use of a cane, walker or w/c? No  Grab bars in the bathroom? Yes  Shower chair or bench in shower? Yes  Elevated toilet seat or a handicapped toilet? Yes   DME ORDERS:  DME order needed?  No   TIMED UP AND GO:  Was the test performed? No . Telephonic visit.   Education: Fall risk prevention has been discussed.  Intervention(s) required? No   Cognitive Function:     6CIT Screen 03/18/2019  What Year? 0 points  What month? 0 points  What time? 0 points  Count back from 20 0 points  Months in reverse 0 points  Repeat phrase 0 points  Total Score 0    Screening Tests Health Maintenance  Topic Date Due  . MAMMOGRAM  06/07/2017  . INFLUENZA VACCINE  01/10/2019  . TETANUS/TDAP  03/17/2020 (Originally 06/15/1969)  . PNA vac Low Risk Adult (2 of 2 - PPSV23) 05/29/2021  . COLONOSCOPY  07/10/2022  . DEXA SCAN  Completed  . Hepatitis C Screening  Completed    Qualifies for Shingles Vaccine? Yes  Shingrix series completed 06/10/18  Tdap: Although this vaccine is not a covered service during a Wellness Exam, does the patient still  wish to receive this vaccine today?  No .  Education has been provided regarding  the importance of this vaccine. Advised may receive this vaccine at local pharmacy or Health Dept. Aware to provide a copy of the vaccination record if obtained from local pharmacy or Health Dept. Verbalized acceptance and understanding.  Flu Vaccine: Due for Flu vaccine. Does the patient want to receive this vaccine today?  No . Education has been provided regarding the importance of this vaccine but still declined. Advised may receive this vaccine at local pharmacy or Health Dept. Aware to provide a copy of the vaccination record if obtained from local pharmacy or Health Dept. Verbalized acceptance and understanding.  Pneumococcal Vaccine: Up to date   Cancer Screenings:  Colorectal Screening: Completed 07/10/17. Repeat every 5 years;   Mammogram: Completed 06/07/16. Repeat every year. Ordered 06/25/18. Pt provided with contact information and advised to call to schedule appt.   Bone Density: Completed 06/02/15. Results reflect  OSTEOPENIA. Repeat every 2 years. Ordered today. Pt provided with contact information and advised to call to schedule appt.   Lung Cancer Screening: (Low Dose CT Chest recommended if Age 10-80 years, 30 pack-year currently smoking OR have quit w/in 15years.) does qualify.   Lung Cancer Screening Referral: An Epic message has been sent to Burgess Estelle, RN (Oncology Nurse Navigator) regarding the possible need for this exam. Raquel Sarna will review the patient's chart to determine if the patient truly qualifies for the exam. If the patient qualifies, Raquel Sarna will order the Low Dose CT of the chest to facilitate the scheduling of this exam.  Additional Screening:  Hepatitis C Screening: does qualify; Completed 05/26/15  Vision Screening: Recommended annual ophthalmology exams for early detection of glaucoma and other disorders of the eye. Is the patient up to date with their annual eye exam?  No  Who is the provider or what is the name of the office in which the pt attends  annual eye exams? [facility]  Dental Screening: Recommended annual dental exams for proper oral hygiene  Community Resource Referral:  CRR required this visit?  No      Plan:    I have personally reviewed and addressed the Medicare Annual Wellness questionnaire and have noted the following in the patient's chart:  A. Medical and social history B. Use of alcohol, tobacco or illicit drugs  C. Current medications and supplements D. Functional ability and status E.  Nutritional status F.  Physical activity G. Advance directives H. List of other physicians I.  Hospitalizations, surgeries, and ER visits in previous 12 months J.  [place] such as hearing and vision if needed, cognitive and depression L. Referrals and appointments   In addition, I have reviewed and discussed with patient certain preventive protocols, quality metrics, and best practice recommendations. A written personalized care plan for preventive services as well as general preventive health recommendations were provided to patient.   Signed,  Carolyn Marker, LPN Nurse Health Advisor   Nurse Notes: pt feels like she is going is going through a period of depression for the past 2 years due to issues with her son who lives 3 hours away and mom passed away. PHQ 9 today 19. Pt states she feels like things are getting better with her medications, currently seeing a therapist for counseling.

## 2019-03-18 NOTE — Patient Instructions (Signed)
Ms. Carolyn Dunlap , Thank you for taking time to come for your Medicare Wellness Visit. I appreciate your ongoing commitment to your health goals. Please review the following plan we discussed and let me know if I can assist you in the future.   Screening recommendations/referrals: Colonoscopy: done 07/10/17. Repeat in 2024. Mammogram: done 06/07/16. Please call [phone] to schedule your mammogram and bone density screening.  Bone Density: done 06/02/15 Recommended yearly ophthalmology/optometry visit for glaucoma screening and checkup Recommended yearly dental visit for hygiene and checkup  Vaccinations: Influenza vaccine: done 04/04/18 Pneumococcal vaccine: done 05/29/16 Tdap vaccine: due - please contact us if you get a cut or scrape on rust or metal Shingles vaccine: Shingrix series completed 06/10/18    Advanced directives: Please bring a copy of your health care power of attorney and living will to the office at your convenience.  Conditions/risks identified: If you wish to quit smoking, help is available. For free tobacco cessation program offerings call the [facility] at [phone] or Live Well Line at [phone]. You may also visit [email or web address] or email [email or web address] for more information on other programs.   Next appointment: Please follow up in one year for your Medicare Annual Wellness visit.     Preventive Care 61 Years and Older, Female Preventive care refers to lifestyle choices and visits with your health care provider that can promote health and wellness. What does preventive care include?  A yearly physical exam. This is also called an annual well check.  Dental exams once or twice a year.  Routine eye exams. Ask your health care provider how often you should have your eyes checked.  Personal lifestyle choices, including:  Daily care of your teeth and gums.  Regular physical activity.  Eating a healthy diet.  Avoiding  tobacco and drug use.  Limiting alcohol use.  Practicing safe sex.  Taking low-dose aspirin every day.  Taking vitamin and mineral supplements as recommended by your health care provider. What happens during an annual well check? The services and screenings done by your health care provider during your annual well check will depend on your age, overall health, lifestyle risk factors, and family history of disease. Counseling  Your health care provider may ask you questions about your:  Alcohol use.  Tobacco use.  Drug use.  Emotional well-being.  Home and relationship well-being.  Sexual activity.  Eating habits.  History of falls.  Memory and ability to understand (cognition).  Work and work Statistician.  Reproductive health. Screening  You may have the following tests or measurements:  Height, weight, and BMI.  Blood pressure.  Lipid and cholesterol levels. These may be checked every 5 years, or more frequently if you are over 24 years old.  Skin check.  Lung cancer screening. You may have this screening every year starting at age 44 if you have a 30-pack-year history of smoking and currently smoke or have quit within the past 15 years.  Fecal occult blood test (FOBT) of the stool. You may have this test every year starting at age 77.  Flexible sigmoidoscopy or colonoscopy. You may have a sigmoidoscopy every 5 years or a colonoscopy every 10 years starting at age 46.  Hepatitis C blood test.  Hepatitis B blood test.  Sexually transmitted disease (STD) testing.  Diabetes screening. This is done by checking your blood sugar (glucose) after you have not eaten for a while (fasting). You may have this done every 1-3 years.  Bone density  scan. This is done to screen for osteoporosis. You may have this done starting at age 64.  Mammogram. This may be done every 1-2 years. Talk to your health care provider about how often you should have regular mammograms.  Talk with your health care provider about your test results, treatment options, and if necessary, the need for more tests. Vaccines  Your health care provider may recommend certain vaccines, such as:  Influenza vaccine. This is recommended every year.  Tetanus, diphtheria, and acellular pertussis (Tdap, Td) vaccine. You may need a Td booster every 10 years.  Zoster vaccine. You may need this after age 35.  Pneumococcal 13-valent conjugate (PCV13) vaccine. One dose is recommended after age 27.  Pneumococcal polysaccharide (PPSV23) vaccine. One dose is recommended after age 3. Talk to your health care provider about which screenings and vaccines you need and how often you need them. This information is not intended to replace advice given to you by your health care provider. Make sure you discuss any questions you have with your health care provider. Document Released: 06/24/2015 Document Revised: 02/15/2016 Document Reviewed: 03/29/2015 Elsevier Interactive Patient Education  2017 [place] Prevention in the Home Falls can cause injuries. They can happen to people of all ages. There are many things you can do to make your home safe and to help prevent falls. What can I do on the outside of my home?  Regularly fix the edges of walkways and driveways and fix any cracks.  Remove anything that might make you trip as you walk through a door, such as a raised step or threshold.  Trim any bushes or trees on the path to your home.  Use bright outdoor lighting.  Clear any walking paths of anything that might make someone trip, such as rocks or tools.  Regularly check to see if handrails are loose or broken. Make sure that both sides of any steps have handrails.  Any raised decks and porches should have guardrails on the edges.  Have any leaves, snow, or ice cleared regularly.  Use sand or salt on walking paths during winter.  Clean up any spills in your garage right away.  This includes oil or grease spills. What can I do in the bathroom?  Use night lights.  Install grab bars by the toilet and in the tub and shower. Do not use towel bars as grab bars.  Use non-skid mats or decals in the tub or shower.  If you need to sit down in the shower, use a plastic, non-slip stool.  Keep the floor dry. Clean up any water that spills on the floor as soon as it happens.  Remove soap buildup in the tub or shower regularly.  Attach bath mats securely with double-sided non-slip rug tape.  Do not have throw rugs and other things on the floor that can make you trip. What can I do in the bedroom?  Use night lights.  Make sure that you have a light by your bed that is easy to reach.  Do not use any sheets or blankets that are too big for your bed. They should not hang down onto the floor.  Have a firm chair that has side arms. You can use this for support while you get dressed.  Do not have throw rugs and other things on the floor that can make you trip. What can I do in the kitchen?  Clean up any spills right away.  Avoid walking on wet  floors.  Keep items that you use a lot in easy-to-reach places.  If you need to reach something above you, use a strong step stool that has a grab bar.  Keep electrical cords out of the way.  Do not use floor polish or wax that makes floors slippery. If you must use wax, use non-skid floor wax.  Do not have throw rugs and other things on the floor that can make you trip. What can I do with my stairs?  Do not leave any items on the stairs.  Make sure that there are handrails on both sides of the stairs and use them. Fix handrails that are broken or loose. Make sure that handrails are as long as the stairways.  Check any carpeting to make sure that it is firmly attached to the stairs. Fix any carpet that is loose or worn.  Avoid having throw rugs at the top or bottom of the stairs. If you do have throw rugs, attach them  to the floor with carpet tape.  Make sure that you have a light switch at the top of the stairs and the bottom of the stairs. If you do not have them, ask someone to add them for you. What else can I do to help prevent falls?  Wear shoes that:  Do not have high heels.  Have rubber bottoms.  Are comfortable and fit you well.  Are closed at the toe. Do not wear sandals.  If you use a stepladder:  Make sure that it is fully opened. Do not climb a closed stepladder.  Make sure that both sides of the stepladder are locked into place.  Ask someone to hold it for you, if possible.  Clearly mark and make sure that you can see:  Any grab bars or handrails.  First and last steps.  Where the edge of each step is.  Use tools that help you move around (mobility aids) if they are needed. These include:  Canes.  Walkers.  Scooters.  Crutches.  Turn on the lights when you go into a dark area. Replace any light bulbs as soon as they burn out.  Set up your furniture so you have a clear path. Avoid moving your furniture around.  If any of your floors are uneven, fix them.  If there are any pets around you, be aware of where they are.  Review your medicines with your doctor. Some medicines can make you feel dizzy. This can increase your chance of falling. Ask your doctor what other things that you can do to help prevent falls. This information is not intended to replace advice given to you by your health care provider. Make sure you discuss any questions you have with your health care provider. Document Released: 03/24/2009 Document Revised: 11/03/2015 Document Reviewed: 07/02/2014 Elsevier Interactive Patient Education  2017 [employer].

## 2019-03-19 ENCOUNTER — Telehealth: Payer: Self-pay | Admitting: *Deleted

## 2019-03-19 NOTE — Telephone Encounter (Signed)
Received referral for low dose lung cancer screening CT scan. Message left at phone number listed in EMR for patient to call me back to facilitate scheduling scan.  

## 2019-03-26 ENCOUNTER — Telehealth: Payer: Self-pay | Admitting: *Deleted

## 2019-03-26 NOTE — Telephone Encounter (Signed)
Received referral for low dose lung cancer screening CT scan. Message left at phone number listed in EMR for patient to call me back to facilitate scheduling scan.  

## 2019-03-30 ENCOUNTER — Telehealth: Payer: Self-pay

## 2019-03-30 NOTE — Telephone Encounter (Signed)
Patient called leaving VM stating her pharmacy told her they are waiting to hear from our office about a prior auth for Ambien.  Called patient pharmacy [facility]. Was informed the patient should have a month left of supply. Said the system shows the patient picked up #30 from [place] in [place] from a Dr. Hassan Buckler and this may be why she needs a PA.  Called and informed the patient she will need to call Dr. Hassan Buckler for the approval to be completed and that she needs to continue to fill the medication for patient for the future since she is her psychiatrist.   Patient verbalized understanding and will call Dr. Rulon Abide office for a PA to be done.

## 2019-03-31 ENCOUNTER — Telehealth: Payer: Self-pay | Admitting: *Deleted

## 2019-03-31 DIAGNOSIS — Z122 Encounter for screening for malignant neoplasm of respiratory organs: Secondary | ICD-10-CM

## 2019-03-31 DIAGNOSIS — Z87891 Personal history of nicotine dependence: Secondary | ICD-10-CM

## 2019-03-31 NOTE — Telephone Encounter (Signed)
Received referral for initial lung cancer screening scan. Contacted patient and obtained smoking history,(current, 30 pack year) as well as answering questions related to screening process. Patient denies signs of lung cancer such as weight loss or hemoptysis. Patient denies comorbidity that would prevent curative treatment if lung cancer were found. Patient is scheduled for shared decision making visit and CT scan on 04/14/19 at 115pm.

## 2019-04-06 DIAGNOSIS — Z23 Encounter for immunization: Secondary | ICD-10-CM | POA: Diagnosis not present

## 2019-04-14 ENCOUNTER — Inpatient Hospital Stay: Payer: Medicare Other | Attending: Oncology | Admitting: Nurse Practitioner

## 2019-04-14 ENCOUNTER — Other Ambulatory Visit: Payer: Self-pay

## 2019-04-14 ENCOUNTER — Ambulatory Visit
Admission: RE | Admit: 2019-04-14 | Discharge: 2019-04-14 | Disposition: A | Discharge status: Home / Self Care | Payer: Medicare Other | Source: Ambulatory Visit | Attending: Oncology | Admitting: Oncology

## 2019-04-14 DIAGNOSIS — Z122 Encounter for screening for malignant neoplasm of respiratory organs: Secondary | ICD-10-CM | POA: Insufficient documentation

## 2019-04-14 DIAGNOSIS — Z87891 Personal history of nicotine dependence: Secondary | ICD-10-CM

## 2019-04-14 NOTE — Progress Notes (Signed)
Virtual Visit via Video Enabled Telemedicine Note   I connected with Carolyn Dunlap on 04/14/19 at 1:15 PM EST by video enabled telemedicine visit and verified that I am speaking with the correct person using two identifiers.   I discussed the limitations, risks, security and privacy concerns of performing an evaluation and management service by telemedicine and the availability of in-person appointments. I also discussed with the patient that there may be a patient responsible charge related to this service. The patient expressed understanding and agreed to proceed.   Other persons participating in the visit and their role in the encounter: Burgess Estelle, RN- checking in patient & navigation  Patient's location: [place]  Provider's location: Clinic  Chief Complaint: Low Dose CT Screening  Patient agreed to evaluation by telemedicine to discuss shared decision making for consideration of low dose CT lung cancer screening.    In accordance with CMS guidelines, patient has met eligibility criteria including age, absence of signs or symptoms of lung cancer.  Social History   Tobacco Use  . Smoking status: Current Every Day Smoker    Packs/day: 1.00    Years: 30.00    Pack years: 30.00    Types: Cigarettes  . Smokeless tobacco: Never Used  . Tobacco comment: [place] smoking cessation information provided  Substance Use Topics  . Alcohol use: Yes    Alcohol/week: 4.0 standard drinks    Types: 4 Glasses of wine per week     A shared decision-making session was conducted prior to the performance of CT scan. This includes one or more decision aids, includes benefits and harms of screening, follow-up diagnostic testing, over-diagnosis, false positive rate, and total radiation exposure.   Counseling on the importance of adherence to annual lung cancer LDCT screening, impact of co-morbidities, and ability or willingness to undergo diagnosis and treatment is imperative for  compliance of the program.   Counseling on the importance of continued smoking cessation for former smokers; the importance of smoking cessation for current smokers, and information about tobacco cessation interventions have been given to patient including [facility] and [facility] [place] programs.   Written order for lung cancer screening with LDCT has been given to the patient and any and all questions have been answered to the best of my abilities.    Yearly follow up will be coordinated by Burgess Estelle, Thoracic Navigator.  I discussed the assessment and treatment plan with the patient. The patient was provided an opportunity to ask questions and all were answered. The patient agreed with the plan and demonstrated an understanding of the instructions.   The patient was advised to call back or seek an in-person evaluation if the symptoms worsen or if the condition fails to improve as anticipated.   I provided 15 minutes of face-to-face video visit time during this encounter, and > 50% was spent counseling as documented under my assessment & plan.   Beckey Rutter, DNP, AGNP-C [place] at [facility] [phone] (work cell) [phone] (office)

## 2019-04-16 ENCOUNTER — Encounter: Payer: Self-pay | Admitting: *Deleted

## 2019-05-06 ENCOUNTER — Other Ambulatory Visit: Payer: Self-pay

## 2019-05-06 ENCOUNTER — Ambulatory Visit
Admission: RE | Admit: 2019-05-06 | Discharge: 2019-05-06 | Disposition: A | Discharge status: Home / Self Care | Payer: Medicare Other | Source: Ambulatory Visit | Attending: Internal Medicine | Admitting: Internal Medicine

## 2019-05-06 ENCOUNTER — Encounter (INDEPENDENT_AMBULATORY_CARE_PROVIDER_SITE_OTHER): Payer: Self-pay

## 2019-05-06 DIAGNOSIS — Z780 Asymptomatic menopausal state: Secondary | ICD-10-CM | POA: Diagnosis not present

## 2019-05-06 DIAGNOSIS — Z1231 Encounter for screening mammogram for malignant neoplasm of breast: Secondary | ICD-10-CM | POA: Insufficient documentation

## 2019-05-13 DIAGNOSIS — F17210 Nicotine dependence, cigarettes, uncomplicated: Secondary | ICD-10-CM | POA: Diagnosis not present

## 2019-05-13 DIAGNOSIS — F332 Major depressive disorder, recurrent severe without psychotic features: Secondary | ICD-10-CM | POA: Diagnosis not present

## 2019-05-13 DIAGNOSIS — F418 Other specified anxiety disorders: Secondary | ICD-10-CM | POA: Diagnosis not present

## 2019-06-29 ENCOUNTER — Other Ambulatory Visit: Payer: Self-pay

## 2019-06-29 ENCOUNTER — Ambulatory Visit (INDEPENDENT_AMBULATORY_CARE_PROVIDER_SITE_OTHER): Payer: Medicare Other | Admitting: Internal Medicine

## 2019-06-29 ENCOUNTER — Encounter: Payer: Self-pay | Admitting: Internal Medicine

## 2019-06-29 VITALS — BP 104/72 | HR 76 | Temp 97.4°F | Ht 67.0 in | Wt 110.0 lb

## 2019-06-29 DIAGNOSIS — Z0000 Encounter for general adult medical examination without abnormal findings: Secondary | ICD-10-CM | POA: Diagnosis not present

## 2019-06-29 DIAGNOSIS — F332 Major depressive disorder, recurrent severe without psychotic features: Secondary | ICD-10-CM | POA: Diagnosis not present

## 2019-06-29 DIAGNOSIS — G43009 Migraine without aura, not intractable, without status migrainosus: Secondary | ICD-10-CM | POA: Diagnosis not present

## 2019-06-29 DIAGNOSIS — R7989 Other specified abnormal findings of blood chemistry: Secondary | ICD-10-CM | POA: Diagnosis not present

## 2019-06-29 DIAGNOSIS — Z1231 Encounter for screening mammogram for malignant neoplasm of breast: Secondary | ICD-10-CM | POA: Diagnosis not present

## 2019-06-29 DIAGNOSIS — G4700 Insomnia, unspecified: Secondary | ICD-10-CM

## 2019-06-29 DIAGNOSIS — K219 Gastro-esophageal reflux disease without esophagitis: Secondary | ICD-10-CM

## 2019-06-29 DIAGNOSIS — F17200 Nicotine dependence, unspecified, uncomplicated: Secondary | ICD-10-CM | POA: Diagnosis not present

## 2019-06-29 DIAGNOSIS — E079 Disorder of thyroid, unspecified: Secondary | ICD-10-CM | POA: Diagnosis not present

## 2019-06-29 LAB — POCT URINALYSIS DIPSTICK
Bilirubin, UA: NEGATIVE
Blood, UA: NEGATIVE
Glucose, UA: NEGATIVE
Ketones, UA: NEGATIVE
Leukocytes, UA: NEGATIVE
Nitrite, UA: NEGATIVE
Protein, UA: NEGATIVE
Spec Grav, UA: 1.015 (ref 1.010–1.025)
Urobilinogen, UA: 0.2 E.U./dL
pH, UA: 5 (ref 5.0–8.0)

## 2019-06-29 NOTE — Progress Notes (Signed)
Date:  06/29/2019   Name:  Carolyn Dunlap   DOB:  [birth year]   MRN:  [id number]   Chief Complaint: Annual Exam (Breast Exam. No pap- aged out.) Carolyn Dunlap is a 69 y.o. female who presents today for her Complete Annual Exam. She feels fairly well. She reports exercising none. She reports she is sleeping fairly well on two sleep medications. She denies breast issues.  Mammogram  04/2019 Pap discontinued DEXA  04/2019 Colonoscopy  06/2017 Immunization History  Administered Date(s) Administered  . Influenza,inj,quad, With Preservative 04/04/2018  . Influenza-Unspecified 04/08/2017, 03/12/2019  . Pneumococcal Conjugate-13 05/26/2015  . Pneumococcal Polysaccharide-23 05/29/2016  . Zoster 02/09/2011  . Zoster Recombinat (Shingrix) 06/25/2017, 06/10/2018  CT Lung cancer screen 04/2019 - negative except for aortic atherosclerosis  Depression        This is a chronic (followed by Psychiatry) problem.The problem is unchanged.  Associated symptoms include insomnia.  Associated symptoms include no fatigue and no headaches.  Past treatments include other medications (bupropion).  Compliance with treatment is good. Insomnia Primary symptoms: sleep disturbance.  Past treatments include medication (medication prescribed by Psychiatry). The treatment provided moderate relief. PMH includes: depression.  Gastroesophageal Reflux She complains of chest pain (discomfort with cough and movement at times) and heartburn. She reports no abdominal pain, no coughing or no wheezing. This is a recurrent problem. The problem occurs rarely. Pertinent negatives include no fatigue. Risk factors include smoking/tobacco exposure. She has tried a histamine-2 antagonist for the symptoms. The treatment provided moderate relief.  Migraine  This is a recurrent problem. Episode frequency: every other month. The problem has been unchanged. Associated symptoms include insomnia. Pertinent negatives include no abdominal pain,  coughing, dizziness, fever, hearing loss, tinnitus or vomiting. She has tried triptans for the symptoms. The treatment provided significant relief.    Lab Results  Component Value Date   CREATININE 0.62 06/25/2018   BUN 5 (L) 06/25/2018   NA 136 06/25/2018   K 4.0 06/25/2018   CL 98 06/25/2018   CO2 22 06/25/2018   Lab Results  Component Value Date   CHOL 189 06/25/2017   HDL 101 06/25/2017   LDLCALC 73 06/25/2017   TRIG 76 06/25/2017   CHOLHDL 1.9 06/25/2017   Lab Results  Component Value Date   TSH 3.220 06/25/2018   Lab Results  Component Value Date   HGBA1C 5.4 05/26/2015     Review of Systems  Constitutional: Negative for chills, fatigue and fever.  HENT: Negative for congestion, hearing loss, tinnitus, trouble swallowing and voice change.   Eyes: Negative for visual disturbance.  Respiratory: Negative for cough, chest tightness, shortness of breath and wheezing.   Cardiovascular: Positive for chest pain (discomfort with cough and movement at times) and leg swelling. Negative for palpitations.  Gastrointestinal: Positive for heartburn. Negative for abdominal pain, constipation, diarrhea and vomiting.  Endocrine: Negative for polydipsia and polyuria.  Genitourinary: Negative for dysuria, frequency, genital sores, vaginal bleeding and vaginal discharge.  Musculoskeletal: Negative for arthralgias, gait problem and joint swelling.  Skin: Negative for color change and rash.  Neurological: Negative for dizziness, tremors, light-headedness and headaches.  Hematological: Negative for adenopathy. Does not bruise/bleed easily.  Psychiatric/Behavioral: Positive for depression, dysphoric mood and sleep disturbance. The patient has insomnia. The patient is not nervous/anxious.     Patient Active Problem List   Diagnosis Date Noted  . Dizziness 07/29/2018  . Imbalance 07/29/2018  . Frequent falls 07/24/2018  . Tremor, unspecified 06/25/2018  .  Special screening for  malignant neoplasms, colon   . Tobacco use disorder 05/29/2016  . Migraine without aura and without status migrainosus, not intractable 03/28/2015  . GERD without esophagitis 03/28/2015  . Osteoporosis 03/28/2015  . Insomnia 03/28/2015  . Moderately severe recurrent major depression ([place]) 03/28/2015  . Anxiety disorder 03/28/2015    Allergies  Allergen Reactions  . Zoloft [Sertraline Hcl]     diaphoresis  . Adhesive [Tape] Rash    Band-aids  . Penicillins Rash    Past Surgical History:  Procedure Laterality Date  . AUGMENTATION MAMMAPLASTY Bilateral 1976  . BREAST ENHANCEMENT SURGERY  1976  . CATARACT EXTRACTION  2002  . COLONOSCOPY WITH PROPOFOL N/A 07/10/2017   Procedure: COLONOSCOPY WITH PROPOFOL;  Surgeon: Lin Landsman, MD;  Location: [place];  Service: Endoscopy;  Laterality: N/A;  . POLYPECTOMY N/A 07/10/2017   Procedure: POLYPECTOMY;  Surgeon: Lin Landsman, MD;  Location: [place];  Service: Endoscopy;  Laterality: N/A;    Social History   Tobacco Use  . Smoking status: Current Every Day Smoker    Packs/day: 0.75    Years: 30.00    Pack years: 22.50    Types: Cigarettes  . Smokeless tobacco: Never Used  . Tobacco comment:  smoking cessation information provided  Substance Use Topics  . Alcohol use: Yes    Alcohol/week: 4.0 standard drinks    Types: 4 Glasses of wine per week  . Drug use: No     Medication list has been reviewed and updated.  Current Meds  Medication Sig  . ARIPiprazole (ABILIFY) 5 MG tablet Take 5 mg by mouth daily.  Marland Kitchen buPROPion (WELLBUTRIN XL) 150 MG 24 hr tablet Take 1 tablet (150 mg total) by mouth daily. (Patient taking differently: Take 300 mg by mouth daily. )  . Calcium-Magnesium-Vitamin D (CALCIUM 500 PO) Take by mouth.  . cyclobenzaprine (FLEXERIL) 10 MG tablet Take 1 tablet (10 mg total) by mouth at bedtime. (Patient taking differently: Take 10 mg by mouth at bedtime. PRN only)  .  famotidine (PEPCID) 20 MG tablet Take 1 tablet (20 mg total) by mouth 2 (two) times daily. (Patient taking differently: Take 20 mg by mouth 2 (two) times daily. PRN only)  . Multiple Vitamin (MULTI-DAY PO) Take by mouth.  . SUMAtriptan (IMITREX) 100 MG tablet TAKE 1 TABLET DAILY AS     DIRECTED BY YOUR DOCTOR  . traZODone (DESYREL) 100 MG tablet   . zolpidem (AMBIEN) 10 MG tablet TAKE 1 TABLET AT BEDTIME. (Patient taking differently: Prescribed by Dr. Hassan Buckler)    [facility] 2/9 Scores 06/29/2019 03/18/2019 01/01/2019 06/25/2018  PHQ - 2 Score 3 6 1 6   PHQ- 9 Score 9 19 9 18     BP Readings from Last 3 Encounters:  06/29/19 104/72  03/18/19 109/78  01/01/19 (!) 86/60    Physical Exam Vitals and nursing note reviewed.  Constitutional:      General: She is not in acute distress.    Appearance: She is well-developed.  HENT:     Head: Normocephalic and atraumatic.     Right Ear: Tympanic membrane and ear canal normal.     Left Ear: Tympanic membrane and ear canal normal.     Nose:     Right Sinus: No maxillary sinus tenderness.     Left Sinus: No maxillary sinus tenderness.  Eyes:     General: No scleral icterus.       Right eye: No discharge.  Left eye: No discharge.     Conjunctiva/sclera: Conjunctivae normal.  Neck:     Thyroid: No thyromegaly.     Vascular: No carotid bruit.  Cardiovascular:     Rate and Rhythm: Normal rate and regular rhythm.     Pulses: Normal pulses.     Heart sounds: Normal heart sounds.  Pulmonary:     Effort: Pulmonary effort is normal. No respiratory distress.     Breath sounds: No wheezing.  Chest:     Chest wall: No swelling, tenderness, crepitus or edema.     Breasts:        Right: No mass, nipple discharge, skin change or tenderness.        Left: No mass, nipple discharge, skin change or tenderness.     Comments: Bilateral breast implants very firm but mobile and non tender Abdominal:     General: Bowel sounds are normal.     Palpations:  Abdomen is soft.     Tenderness: There is no abdominal tenderness.  Musculoskeletal:        General: Normal range of motion.     Cervical back: Normal range of motion. No erythema.     Right lower leg: Edema present.     Left lower leg: Edema (trace ankle edema) present.  Lymphadenopathy:     Cervical: No cervical adenopathy.  Skin:    General: Skin is warm and dry.     Capillary Refill: Capillary refill takes less than 2 seconds.     Findings: No lesion or rash.  Neurological:     Mental Status: She is alert and oriented to person, place, and time.     Cranial Nerves: No cranial nerve deficit.     Sensory: No sensory deficit.     Deep Tendon Reflexes: Reflexes are normal and symmetric.  Psychiatric:        Speech: Speech normal.        Behavior: Behavior normal.        Thought Content: Thought content normal.     Wt Readings from Last 3 Encounters:  06/29/19 110 lb (49.9 kg)  04/14/19 108 lb (49 kg)  03/18/19 110 lb (49.9 kg)    BP 104/72   Pulse 76   Temp (!) 97.4 F (36.3 C) (Oral)   Ht 5\' 7"  (1.702 m)   Wt 110 lb (49.9 kg)   SpO2 97%   BMI 17.23 kg/m   Assessment and Plan: 1. Annual physical exam Normal exam - Comprehensive metabolic panel - Lipid panel - POCT urinalysis dipstick  2. Encounter for screening mammogram for breast cancer Recently completed and was normal  3. Migraine without aura and without status migrainosus, not intractable Intermittent occurrence - relieved by either Excedrin Migraine or Imitrex  4. GERD without esophagitis Symptoms well controlled. No red flag signs such as weight loss, n/v, melena Will continue famotidine PRN. - CBC with Differential/Platelet  5. Moderately severe recurrent major depression ([place]) Clinically improved/stable on current regimen Seeing Psych regularly - TSH  6. Insomnia, unspecified type On Trazodone and Ambien Sleeps well intermittently  7. Tobacco use disorder Aortic atherosclerosis noted on  recent LDCT.  No explanation for atypical chest pain/pulmonary process Cholesterol is normal. She needs to quit smoking - she had Chantix at home but has not started it.   Partially dictated using Editor, commissioning. Any errors are unintentional.  Halina Maidens, MD [facility]  06/29/2019

## 2019-06-29 NOTE — Patient Instructions (Signed)
Covid Vaccine locations: [place]

## 2019-06-30 LAB — COMPREHENSIVE METABOLIC PANEL
ALT: 8 IU/L (ref 0–32)
AST: 17 IU/L (ref 0–40)
Albumin/Globulin Ratio: 1.3 (ref 1.2–2.2)
Albumin: 4.1 g/dL (ref 3.8–4.8)
Alkaline Phosphatase: 126 IU/L — ABNORMAL HIGH (ref 39–117)
BUN/Creatinine Ratio: 8 — ABNORMAL LOW (ref 12–28)
BUN: 5 mg/dL — ABNORMAL LOW (ref 8–27)
Bilirubin Total: 0.4 mg/dL (ref 0.0–1.2)
CO2: 22 mmol/L (ref 20–29)
Calcium: 9.6 mg/dL (ref 8.7–10.3)
Chloride: 96 mmol/L (ref 96–106)
Creatinine, Ser: 0.66 mg/dL (ref 0.57–1.00)
GFR calc Af Amer: 106 mL/min/{1.73_m2} (ref >59)
GFR calc non Af Amer: 92 mL/min/{1.73_m2} (ref >59)
Globulin, Total: 3.2 g/dL (ref 1.5–4.5)
Glucose: 91 mg/dL (ref 65–99)
Potassium: 4.6 mmol/L (ref 3.5–5.2)
Sodium: 132 mmol/L — ABNORMAL LOW (ref 134–144)
Total Protein: 7.3 g/dL (ref 6.0–8.5)

## 2019-06-30 LAB — LIPID PANEL
Chol/HDL Ratio: 2.2 ratio (ref 0.0–4.4)
Cholesterol, Total: 142 mg/dL (ref 100–199)
HDL: 66 mg/dL (ref >39)
LDL Chol Calc (NIH): 65 mg/dL (ref 0–99)
Triglycerides: 47 mg/dL (ref 0–149)
VLDL Cholesterol Cal: 11 mg/dL (ref 5–40)

## 2019-06-30 LAB — CBC WITH DIFFERENTIAL/PLATELET
Basophils Absolute: 0 10*3/uL (ref 0.0–0.2)
Basos: 1 %
EOS (ABSOLUTE): 0.1 10*3/uL (ref 0.0–0.4)
Eos: 1 %
Hematocrit: 32.8 % — ABNORMAL LOW (ref 34.0–46.6)
Hemoglobin: 11.1 g/dL (ref 11.1–15.9)
Immature Grans (Abs): 0 10*3/uL (ref 0.0–0.1)
Immature Granulocytes: 0 %
Lymphocytes Absolute: 1.3 10*3/uL (ref 0.7–3.1)
Lymphs: 20 %
MCH: 29.9 pg (ref 26.6–33.0)
MCHC: 33.8 g/dL (ref 31.5–35.7)
MCV: 88 fL (ref 79–97)
Monocytes Absolute: 0.5 10*3/uL (ref 0.1–0.9)
Monocytes: 7 %
Neutrophils Absolute: 4.5 10*3/uL (ref 1.4–7.0)
Neutrophils: 71 %
Platelets: 151 10*3/uL (ref 150–450)
RBC: 3.71 x10E6/uL — ABNORMAL LOW (ref 3.77–5.28)
RDW: 16.4 % — ABNORMAL HIGH (ref 11.7–15.4)
WBC: 6.4 10*3/uL (ref 3.4–10.8)

## 2019-06-30 LAB — TSH: TSH: 5.49 u[IU]/mL — ABNORMAL HIGH (ref 0.450–4.500)

## 2019-07-05 LAB — SPECIMEN STATUS REPORT

## 2019-07-05 LAB — T4: T4, Total: 7.2 ug/dL (ref 4.5–12.0)

## 2019-08-04 ENCOUNTER — Encounter: Payer: Self-pay | Admitting: Internal Medicine

## 2019-08-04 DIAGNOSIS — E079 Disorder of thyroid, unspecified: Secondary | ICD-10-CM | POA: Insufficient documentation

## 2019-08-06 DIAGNOSIS — F332 Major depressive disorder, recurrent severe without psychotic features: Secondary | ICD-10-CM | POA: Diagnosis not present

## 2019-08-06 DIAGNOSIS — F418 Other specified anxiety disorders: Secondary | ICD-10-CM | POA: Diagnosis not present

## 2019-08-06 DIAGNOSIS — Z79899 Other long term (current) drug therapy: Secondary | ICD-10-CM | POA: Diagnosis not present

## 2019-08-06 DIAGNOSIS — F17210 Nicotine dependence, cigarettes, uncomplicated: Secondary | ICD-10-CM | POA: Diagnosis not present

## 2019-08-27 DIAGNOSIS — F331 Major depressive disorder, recurrent, moderate: Secondary | ICD-10-CM | POA: Diagnosis not present

## 2019-08-27 DIAGNOSIS — R413 Other amnesia: Secondary | ICD-10-CM | POA: Diagnosis not present

## 2019-09-15 DIAGNOSIS — F331 Major depressive disorder, recurrent, moderate: Secondary | ICD-10-CM | POA: Diagnosis not present

## 2019-09-15 DIAGNOSIS — R413 Other amnesia: Secondary | ICD-10-CM | POA: Diagnosis not present

## 2019-10-07 DIAGNOSIS — R413 Other amnesia: Secondary | ICD-10-CM | POA: Diagnosis not present

## 2019-10-07 DIAGNOSIS — F331 Major depressive disorder, recurrent, moderate: Secondary | ICD-10-CM | POA: Diagnosis not present

## 2019-10-09 DIAGNOSIS — F331 Major depressive disorder, recurrent, moderate: Secondary | ICD-10-CM | POA: Diagnosis not present

## 2019-10-09 DIAGNOSIS — R413 Other amnesia: Secondary | ICD-10-CM | POA: Diagnosis not present

## 2019-10-19 DIAGNOSIS — R413 Other amnesia: Secondary | ICD-10-CM | POA: Diagnosis not present

## 2019-10-19 DIAGNOSIS — F331 Major depressive disorder, recurrent, moderate: Secondary | ICD-10-CM | POA: Diagnosis not present

## 2020-01-04 DIAGNOSIS — G3184 Mild cognitive impairment, so stated: Secondary | ICD-10-CM | POA: Diagnosis not present

## 2020-01-04 DIAGNOSIS — R636 Underweight: Secondary | ICD-10-CM | POA: Diagnosis not present

## 2020-01-06 ENCOUNTER — Encounter: Payer: Self-pay | Admitting: Internal Medicine

## 2020-01-06 ENCOUNTER — Other Ambulatory Visit: Payer: Self-pay

## 2020-01-06 ENCOUNTER — Ambulatory Visit (INDEPENDENT_AMBULATORY_CARE_PROVIDER_SITE_OTHER): Payer: Medicare Other | Admitting: Internal Medicine

## 2020-01-06 VITALS — BP 118/78 | HR 86 | Temp 98.1°F | Ht 67.0 in | Wt 94.0 lb

## 2020-01-06 DIAGNOSIS — F411 Generalized anxiety disorder: Secondary | ICD-10-CM

## 2020-01-06 DIAGNOSIS — R634 Abnormal weight loss: Secondary | ICD-10-CM

## 2020-01-06 DIAGNOSIS — E079 Disorder of thyroid, unspecified: Secondary | ICD-10-CM | POA: Diagnosis not present

## 2020-01-06 DIAGNOSIS — I700 Atherosclerosis of aorta: Secondary | ICD-10-CM | POA: Insufficient documentation

## 2020-01-06 NOTE — Progress Notes (Signed)
Date:  01/06/2020   Name:  Carolyn Dunlap   DOB:  07-14-1950   MRN:  [id number]   Chief Complaint: Weight Loss  HPI  Lab Results  Component Value Date   CREATININE 0.66 06/29/2019   BUN 5 (L) 06/29/2019   NA 132 (L) 06/29/2019   K 4.6 06/29/2019   CL 96 06/29/2019   CO2 22 06/29/2019   Lab Results  Component Value Date   CHOL 142 06/29/2019   HDL 66 06/29/2019   LDLCALC 65 06/29/2019   TRIG 47 06/29/2019   CHOLHDL 2.2 06/29/2019   Lab Results  Component Value Date   TSH 5.490 (H) 06/29/2019   Lab Results  Component Value Date   HGBA1C 5.4 05/26/2015   Lab Results  Component Value Date   WBC 6.4 06/29/2019   HGB 11.1 06/29/2019   HCT 32.8 (L) 06/29/2019   MCV 88 06/29/2019   PLT 151 06/29/2019   Lab Results  Component Value Date   ALT 8 06/29/2019   AST 17 06/29/2019   ALKPHOS 126 (H) 06/29/2019   BILITOT 0.4 06/29/2019     Review of Systems  Constitutional: Positive for appetite change and fatigue. Negative for diaphoresis and fever.  HENT: Negative for trouble swallowing.   Respiratory: Positive for cough. Negative for chest tightness, shortness of breath and wheezing.   Cardiovascular: Negative for chest pain, palpitations and leg swelling.  Gastrointestinal: Negative for abdominal pain, constipation, diarrhea and vomiting.  Neurological: Negative for dizziness and headaches.  Psychiatric/Behavioral: Positive for dysphoric mood. The patient is nervous/anxious.     Patient Active Problem List   Diagnosis Date Noted  . Atherosclerosis of coronary artery 01/06/2020  . Disorder of thyroid 08/04/2019  . Dizziness 07/29/2018  . Imbalance 07/29/2018  . Frequent falls 07/24/2018  . Tremor, unspecified 06/25/2018  . Special screening for malignant neoplasms, colon   . Tobacco use disorder 05/29/2016  . Migraine without aura and without status migrainosus, not intractable 03/28/2015  . GERD without esophagitis 03/28/2015  . Osteoporosis 03/28/2015    . Insomnia 03/28/2015  . Moderately severe recurrent major depression ([place]) 03/28/2015  . Anxiety disorder 03/28/2015    Allergies  Allergen Reactions  . Zoloft [Sertraline Hcl]     diaphoresis  . Adhesive [Tape] Rash    Band-aids  . Penicillins Rash    Past Surgical History:  Procedure Laterality Date  . AUGMENTATION MAMMAPLASTY Bilateral 1976  . BREAST ENHANCEMENT SURGERY  1976  . CATARACT EXTRACTION  2002  . COLONOSCOPY WITH PROPOFOL N/A 07/10/2017   Procedure: COLONOSCOPY WITH PROPOFOL;  Surgeon: Lin Landsman, MD;  Location: [place];  Service: Endoscopy;  Laterality: N/A;  . POLYPECTOMY N/A 07/10/2017   Procedure: POLYPECTOMY;  Surgeon: Lin Landsman, MD;  Location: [place];  Service: Endoscopy;  Laterality: N/A;    Social History   Tobacco Use  . Smoking status: Current Every Day Smoker    Packs/day: 0.50    Years: 30.00    Pack years: 15.00    Types: Cigarettes  . Smokeless tobacco: Never Used  . Tobacco comment: [place] smoking cessation information provided  Vaping Use  . Vaping Use: Never used  Substance Use Topics  . Alcohol use: Yes    Alcohol/week: 4.0 standard drinks    Types: 4 Glasses of wine per week  . Drug use: No     Medication list has been reviewed and updated.  Current Meds  Medication Sig  .  ARIPiprazole (ABILIFY) 5 MG tablet Take 5 mg by mouth daily.  Marland Kitchen buPROPion (WELLBUTRIN XL) 150 MG 24 hr tablet Take 1 tablet (150 mg total) by mouth daily. (Patient taking differently: Take 300 mg by mouth daily. )  . Calcium-Magnesium-Vitamin D (CALCIUM 500 PO) Take by mouth.  . cyclobenzaprine (FLEXERIL) 10 MG tablet Take 1 tablet (10 mg total) by mouth at bedtime. (Patient taking differently: Take 10 mg by mouth at bedtime. PRN only)  . cyproheptadine (PERIACTIN) 4 MG tablet Take 1 tablet by mouth in the morning, at noon, in the evening, and at bedtime.  . famotidine (PEPCID) 20 MG tablet Take 1 tablet (20 mg  total) by mouth 2 (two) times daily. (Patient taking differently: Take 20 mg by mouth 2 (two) times daily. PRN only)  . Multiple Vitamin (MULTI-DAY PO) Take by mouth.  . SUMAtriptan (IMITREX) 100 MG tablet TAKE 1 TABLET DAILY AS     DIRECTED BY YOUR DOCTOR  . traZODone (DESYREL) 100 MG tablet   . zolpidem (AMBIEN) 10 MG tablet TAKE 1 TABLET AT BEDTIME. (Patient taking differently: Prescribed by Dr. Hassan Buckler)    [facility] 2/9 Scores 01/06/2020 06/29/2019 03/18/2019 01/01/2019  PHQ - 2 Score 6 3 6 1   PHQ- 9 Score 22 9 19 9     GAD 7 : Generalized Anxiety Score 01/06/2020 03/11/2018 01/24/2018  Nervous, Anxious, on Edge 3 3 3   Control/stop worrying 3 3 3   Worry too much - different things 3 3 3   Trouble relaxing 1 3 3   Restless 1 3 3   Easily annoyed or irritable 0 0 2  Afraid - awful might happen 2 3 3   Total GAD 7 Score 13 18 20   Anxiety Difficulty Very difficult Extremely difficult Extremely difficult    BP Readings from Last 3 Encounters:  01/06/20 118/78  06/29/19 104/72  03/18/19 109/78    Physical Exam Constitutional:      Appearance: She is cachectic.  Neck:     Vascular: No carotid bruit.  Cardiovascular:     Rate and Rhythm: Normal rate and regular rhythm.     Pulses: Normal pulses.  Pulmonary:     Breath sounds: No wheezing or rhonchi.  Musculoskeletal:     Cervical back: Normal range of motion.     Right lower leg: No edema.     Left lower leg: No edema.  Lymphadenopathy:     Cervical: No cervical adenopathy.  Skin:    General: Skin is warm and dry.  Neurological:     General: No focal deficit present.     Mental Status: She is alert.  Psychiatric:        Mood and Affect: Mood is depressed.     Wt Readings from Last 3 Encounters:  01/06/20 (!) 94 lb (42.6 kg)  06/29/19 110 lb (49.9 kg)  04/14/19 108 lb (49 kg)    BP 118/78   Pulse 86   Temp 98.1 F (36.7 C) (Oral)   Ht 5\' 7"  (1.702 m)   Wt (!) 94 lb (42.6 kg)   SpO2 97%   BMI 14.72 kg/m   Assessment and  Plan: 1. Weight loss, unintentional No obvious cause other than anorexia secondary to anxiety/depression Colonoscopy within the last 2 years - TA x 2 CT chest (low dose) negative for malignancy - + aortic atherosclerosis and COPD Has not started ciproheptadine yet - waiting for [facility] to order it Continue nutritional supplements with [employer] instant breakfast - CBC with Differential/Platelet - Comprehensive metabolic  panel  2. Disorder of thyroid Check labs to rule out metabolic cause of weight loss - TSH+T4F+T3Free  3. Generalized anxiety disorder Being followed and treated by Psych Has follow up in 6 weeks for recheck, weight check   Partially dictated using Dragon software. Any errors are unintentional.  Halina Maidens, MD [facility]  01/06/2020

## 2020-01-07 LAB — CBC WITH DIFFERENTIAL/PLATELET
Basophils Absolute: 0 10*3/uL (ref 0.0–0.2)
Basos: 1 %
EOS (ABSOLUTE): 0.1 10*3/uL (ref 0.0–0.4)
Eos: 1 %
Hematocrit: 35.4 % (ref 34.0–46.6)
Hemoglobin: 11.4 g/dL (ref 11.1–15.9)
Immature Grans (Abs): 0 10*3/uL (ref 0.0–0.1)
Immature Granulocytes: 0 %
Lymphocytes Absolute: 0.9 10*3/uL (ref 0.7–3.1)
Lymphs: 16 %
MCH: 28.5 pg (ref 26.6–33.0)
MCHC: 32.2 g/dL (ref 31.5–35.7)
MCV: 89 fL (ref 79–97)
Monocytes Absolute: 0.5 10*3/uL (ref 0.1–0.9)
Monocytes: 8 %
Neutrophils Absolute: 4.4 10*3/uL (ref 1.4–7.0)
Neutrophils: 74 %
Platelets: 223 10*3/uL (ref 150–450)
RBC: 4 x10E6/uL (ref 3.77–5.28)
RDW: 15.1 % (ref 11.7–15.4)
WBC: 5.9 10*3/uL (ref 3.4–10.8)

## 2020-01-07 LAB — COMPREHENSIVE METABOLIC PANEL
ALT: 11 IU/L (ref 0–32)
AST: 15 IU/L (ref 0–40)
Albumin/Globulin Ratio: 1.1 — ABNORMAL LOW (ref 1.2–2.2)
Albumin: 3.7 g/dL — ABNORMAL LOW (ref 3.8–4.8)
Alkaline Phosphatase: 174 IU/L — ABNORMAL HIGH (ref 48–121)
BUN/Creatinine Ratio: 10 — ABNORMAL LOW (ref 12–28)
BUN: 7 mg/dL — ABNORMAL LOW (ref 8–27)
Bilirubin Total: 0.4 mg/dL (ref 0.0–1.2)
CO2: 22 mmol/L (ref 20–29)
Calcium: 9.6 mg/dL (ref 8.7–10.3)
Chloride: 99 mmol/L (ref 96–106)
Creatinine, Ser: 0.7 mg/dL (ref 0.57–1.00)
GFR calc Af Amer: 104 mL/min/{1.73_m2} (ref >59)
GFR calc non Af Amer: 90 mL/min/{1.73_m2} (ref >59)
Globulin, Total: 3.3 g/dL (ref 1.5–4.5)
Glucose: 72 mg/dL (ref 65–99)
Potassium: 4 mmol/L (ref 3.5–5.2)
Sodium: 136 mmol/L (ref 134–144)
Total Protein: 7 g/dL (ref 6.0–8.5)

## 2020-01-07 LAB — TSH+T4F+T3FREE
Free T4: 1.21 ng/dL (ref 0.82–1.77)
T3, Free: 2.2 pg/mL (ref 2.0–4.4)
TSH: 4.96 u[IU]/mL — ABNORMAL HIGH (ref 0.450–4.500)

## 2020-02-16 DIAGNOSIS — R636 Underweight: Secondary | ICD-10-CM | POA: Diagnosis not present

## 2020-02-16 DIAGNOSIS — F332 Major depressive disorder, recurrent severe without psychotic features: Secondary | ICD-10-CM | POA: Diagnosis not present

## 2020-02-16 DIAGNOSIS — F17210 Nicotine dependence, cigarettes, uncomplicated: Secondary | ICD-10-CM | POA: Diagnosis not present

## 2020-02-16 DIAGNOSIS — G3184 Mild cognitive impairment, so stated: Secondary | ICD-10-CM | POA: Diagnosis not present

## 2020-02-16 DIAGNOSIS — Z79899 Other long term (current) drug therapy: Secondary | ICD-10-CM | POA: Diagnosis not present

## 2020-02-16 DIAGNOSIS — F418 Other specified anxiety disorders: Secondary | ICD-10-CM | POA: Diagnosis not present

## 2020-03-21 ENCOUNTER — Other Ambulatory Visit: Payer: Self-pay

## 2020-03-21 ENCOUNTER — Ambulatory Visit (INDEPENDENT_AMBULATORY_CARE_PROVIDER_SITE_OTHER): Payer: Medicare Other

## 2020-03-21 VITALS — BP 110/70 | HR 76 | Temp 97.5°F | Resp 17 | Ht 67.0 in | Wt 92.4 lb

## 2020-03-21 DIAGNOSIS — Z0000 Encounter for general adult medical examination without abnormal findings: Secondary | ICD-10-CM | POA: Diagnosis not present

## 2020-03-21 DIAGNOSIS — Z1231 Encounter for screening mammogram for malignant neoplasm of breast: Secondary | ICD-10-CM

## 2020-03-21 DIAGNOSIS — Z23 Encounter for immunization: Secondary | ICD-10-CM | POA: Diagnosis not present

## 2020-03-21 NOTE — Progress Notes (Signed)
Subjective:   Carolyn Dunlap is a 69 y.o. female who presents for Medicare Annual (Subsequent) preventive examination.  Review of Systems     Cardiac Risk Factors include: advanced age (>21men, >64 women);smoking/ tobacco exposure     Objective:    Today's Vitals   03/21/20 1056  BP: 110/70  Pulse: 76  Resp: 17  Temp: (!) 97.5 F (36.4 C)  TempSrc: Oral  Weight: 92 lb 6.4 oz (41.9 kg)  Height: 5\' 7"  (1.702 m)   Body mass index is 14.47 kg/m.  Advanced Directives 03/21/2020 03/18/2019 07/10/2017 05/29/2016 08/09/2015 08/09/2015  Does Patient Have a Medical Advance Directive? Yes Yes Yes No;Yes Yes No  Type of Paramedic of [place];Living will [place];Living will [place];Living will Living will Living will -  Does patient want to make changes to medical advance directive? - - No - Patient declined - - -  Copy of [place] in Chart? No - copy requested No - copy requested No - copy requested - - -  Would patient like information on creating a medical advance directive? - - - - - No - patient declined information    Current Medications (verified) Outpatient Encounter Medications as of 03/21/2020  Medication Sig  . ARIPiprazole (ABILIFY) 5 MG tablet Take 5 mg by mouth daily.  Marland Kitchen buPROPion (WELLBUTRIN XL) 150 MG 24 hr tablet Take 1 tablet (150 mg total) by mouth daily. (Patient taking differently: Take 300 mg by mouth daily. )  . Calcium-Magnesium-Vitamin D (CALCIUM 500 PO) Take by mouth.  . cyproheptadine (PERIACTIN) 4 MG tablet Take 1 tablet by mouth in the morning, at noon, in the evening, and at bedtime.  . famotidine (PEPCID) 20 MG tablet Take 1 tablet (20 mg total) by mouth 2 (two) times daily. (Patient taking differently: Take 20 mg by mouth 2 (two) times daily. PRN only)  . Multiple Vitamin (MULTI-DAY PO) Take by mouth.  . SUMAtriptan (IMITREX) 100 MG tablet TAKE 1 TABLET DAILY AS      DIRECTED BY YOUR DOCTOR  . traZODone (DESYREL) 100 MG tablet   . zolpidem (AMBIEN) 10 MG tablet TAKE 1 TABLET AT BEDTIME. (Patient taking differently: Prescribed by Dr. Hassan Buckler)  . cyclobenzaprine (FLEXERIL) 10 MG tablet Take 1 tablet (10 mg total) by mouth at bedtime. (Patient not taking: Reported on 03/21/2020)   No facility-administered encounter medications on file as of 03/21/2020.    Allergies (verified) Zoloft [sertraline hcl], Adhesive [tape], and Penicillins   History: Past Medical History:  Diagnosis Date  . Depression   . GERD (gastroesophageal reflux disease)   . Insomnia   . Migraine headache    weekly  . PONV (postoperative nausea and vomiting)    in distant past   Past Surgical History:  Procedure Laterality Date  . AUGMENTATION MAMMAPLASTY Bilateral 1976  . BREAST ENHANCEMENT SURGERY  1976  . CATARACT EXTRACTION  2002  . COLONOSCOPY WITH PROPOFOL N/A 07/10/2017   Procedure: COLONOSCOPY WITH PROPOFOL;  Surgeon: Lin Landsman, MD;  Location: [facility];  Service: Endoscopy;  Laterality: N/A;  . POLYPECTOMY N/A 07/10/2017   Procedure: POLYPECTOMY;  Surgeon: Lin Landsman, MD;  Location: [facility];  Service: Endoscopy;  Laterality: N/A;   Family History  Problem Relation Age of Onset  . Hyperlipidemia Mother   . Hypertension Mother   . Hypertension Father   . Breast cancer Neg Hx    Social History  Socioeconomic History  . Marital status: Divorced    Spouse name: Not on file  . Number of children: 1  . Years of education: Not on file  . Highest education level: Not on file  Occupational History  . Not on file  Tobacco Use  . Smoking status: Current Every Day Smoker    Packs/day: 0.50    Years: 30.00    Pack years: 15.00    Types: Cigarettes  . Smokeless tobacco: Never Used  . Tobacco comment: [place] smoking cessation information provided  Vaping Use  . Vaping Use: Never used  Substance and Sexual Activity  .  Alcohol use: Yes    Alcohol/week: 4.0 standard drinks    Types: 4 Glasses of wine per week  . Drug use: No  . Sexual activity: Not Currently  Other Topics Concern  . Not on file  Social History Narrative   Pt lives with ex husband   Social Determinants of Health   Financial Resource Strain: Low Risk   . Difficulty of Paying Living Expenses: Not hard at all  Food Insecurity: No Food Insecurity  . Worried About Charity fundraiser in the Last Year: Never true  . Ran Out of Food in the Last Year: Never true  Transportation Needs: No Transportation Needs  . Lack of Transportation (Medical): No  . Lack of Transportation (Non-Medical): No  Physical Activity: Inactive  . Days of Exercise per Week: 0 days  . Minutes of Exercise per Session: 0 min  Stress: Stress Concern Present  . Feeling of Stress : Very much  Social Connections: Moderately Isolated  . Frequency of Communication with Friends and Family: Three times a week  . Frequency of Social Gatherings with Friends and Family: Three times a week  . Attends Religious Services: Never  . Active Member of Clubs or Organizations: No  . Attends Archivist Meetings: Never  . Marital Status: Living with partner    Tobacco Counseling Ready to quit: Yes Counseling given: Yes Comment: [place] smoking cessation information provided   Clinical Intake:  Pre-visit preparation completed: Yes  Pain : No/denies pain     BMI - recorded: 14.47 Nutritional Status: BMI <19  Underweight Nutritional Risks: Unintentional weight loss, Other (Comment) (lost 1.5 # since last visit; no appetite) Diabetes: No  How often do you need to have someone help you when you read instructions, pamphlets, or other written materials from your doctor or pharmacy?: 1 - Never    Interpreter Needed?: No  Information entered by :: Clemetine Marker LPN   Activities of Daily Living In your present state of health, do you have any difficulty  performing the following activities: 03/21/2020  Hearing? N  Comment declines hearing aids  Vision? N  Difficulty concentrating or making decisions? N  Walking or climbing stairs? N  Dressing or bathing? N  Doing errands, shopping? N  Preparing Food and eating ? N  Using the Toilet? N  In the past six months, have you accidently leaked urine? N  Do you have problems with loss of bowel control? N  Managing your Medications? N  Managing your Finances? N  Housekeeping or managing your Housekeeping? N  Some recent data might be hidden    Patient Care Team: Glean Hess, MD as PCP - General (Internal Medicine) Ulyess Blossom (Psychiatry)  Indicate any recent Medical Services you may have received from other than [facility] providers in the past year (date may be approximate).  Assessment:   This is a routine wellness examination for Carolyn Dunlap.  Hearing/Vision screen  Hearing Screening   125Hz  250Hz  500Hz  1000Hz  2000Hz  3000Hz  4000Hz  6000Hz  8000Hz   Right ear:           Left ear:           Comments: Pt denies hearing difficulty  Vision Screening Comments: [facility] screenings at [facility], past due for exam  Dietary issues and exercise activities discussed: Current Exercise Habits: The patient does not participate in regular exercise at present, Exercise limited by: None identified  Goals    . Patient Stated     Pt states she would like to manage stress better and hopes her son will be in a better situation.     . Quit Smoking     If you wish to quit smoking, help is available. For free tobacco cessation program offerings call the [facility] at 7752123036 or Live Well Line at 608-440-4648. You may also visit [email or web address] or email [email or web address] for more information on other programs.        Depression Screen PHQ 2/9 Scores 03/21/2020 01/06/2020 06/29/2019 03/18/2019 01/01/2019 06/25/2018 03/11/2018  PHQ - 2 Score 6 6 3 6 1 6 6   PHQ- 9 Score 22  22 9 19 9 18 20     Fall Risk Fall Risk  03/21/2020 01/06/2020 06/29/2019 03/18/2019 06/25/2018  Falls in the past year? 0 0 0 0 1  Number falls in past yr: 0 0 0 0 1  Injury with Fall? 0 0 0 0 1  Risk for fall due to : No Fall Risks No Fall Risks - - History of fall(s);Impaired balance/gait;Other (Comment)  Risk for fall due to: Comment - - - - LOW BP  Follow up Falls prevention discussed Falls evaluation completed Falls evaluation completed Falls prevention discussed -    Any stairs in or around the home? No  If so, are there any without handrails? No  Home free of loose throw rugs in walkways, pet beds, electrical cords, etc? Yes  Adequate lighting in your home to reduce risk of falls? Yes   ASSISTIVE DEVICES UTILIZED TO PREVENT FALLS:  Life alert? No  Use of a cane, walker or w/c? No  Grab bars in the bathroom? Yes Shower chair or bench in shower? No  Elevated toilet seat or a handicapped toilet? No   TIMED UP AND GO:  Was the test performed? Yes .  Length of time to ambulate 10 feet: 4 sec.   Gait steady and fast without use of assistive device  Cognitive Function:     6CIT Screen 03/21/2020 03/18/2019  What Year? 0 points 0 points  What month? 0 points 0 points  What time? 0 points 0 points  Count back from 20 0 points 0 points  Months in reverse 0 points 0 points  Repeat phrase 2 points 0 points  Total Score 2 0    Immunizations Immunization History  Administered Date(s) Administered  . Influenza,inj,quad, With Preservative 04/04/2018  . Influenza-Unspecified 04/08/2017, 03/12/2019  . PFIZER SARS-COV-2 Vaccination 07/29/2019, 08/19/2019  . Pneumococcal Conjugate-13 05/26/2015  . Pneumococcal Polysaccharide-23 05/29/2016  . Zoster 02/09/2011  . Zoster Recombinat (Shingrix) 06/25/2017, 06/10/2018    TDAP status: Due, Education has been provided regarding the importance of this vaccine. Advised may receive this vaccine at local pharmacy or Health Dept. Aware to  provide a copy of the vaccination record if obtained from local pharmacy or Health Dept. Verbalized  acceptance and understanding.   Flu vaccine status: due for 2021-2022 season; pt scheduled for Covid vaccine later today.   Pneumococcal vaccine status: Up to date   Covid-19 vaccine status: Completed vaccines  Qualifies for Shingles Vaccine? Yes   Zostavax completed Yes   Shingrix Completed?: Yes  Screening Tests Health Maintenance  Topic Date Due  . TETANUS/TDAP  Never done  . INFLUENZA VACCINE  01/10/2020  . MAMMOGRAM  05/05/2020  . PNA vac Low Risk Adult (2 of 2 - PPSV23) 05/29/2021  . COLONOSCOPY  07/10/2022  . DEXA SCAN  Completed  . COVID-19 Vaccine  Completed  . Hepatitis C Screening  Completed    Health Maintenance  Health Maintenance Due  Topic Date Due  . TETANUS/TDAP  Never done  . INFLUENZA VACCINE  01/10/2020    Colorectal cancer screening: Completed 07/10/17. Repeat every 5 years   Mammogram status: Completed 05/06/19. Repeat every year   Bone Density status: Completed 05/06/19. Results reflect: Bone density results: OSTEOPOROSIS. Repeat every 2 years.  Lung Cancer Screening: (Low Dose CT Chest recommended if Age 67-80 years, 30 pack-year currently smoking OR have quit w/in 15years.) does not qualify.    Additional Screening:  Hepatitis C Screening: does qualify; Completed 05/26/15  Vision Screening: Recommended annual ophthalmology exams for early detection of glaucoma and other disorders of the eye. Is the patient up to date with their annual eye exam?  No  Who is the provider or what is the name of the office in which the patient attends annual eye exams? [facility]   Dental Screening: Recommended annual dental exams for proper oral hygiene  Community Resource Referral / Chronic Care Management: CRR required this visit?  No   CCM required this visit?  No      Plan:     I have personally reviewed and noted the following in the patient's chart:    . Medical and social history . Use of alcohol, tobacco or illicit drugs  . Current medications and supplements . Functional ability and status . Nutritional status . Physical activity . Advanced directives . List of other physicians . Hospitalizations, surgeries, and ER visits in previous 12 months . Vitals . Screenings to include cognitive, depression, and falls . Referrals and appointments  In addition, I have reviewed and discussed with patient certain preventive protocols, quality metrics, and best practice recommendations. A written personalized care plan for preventive services as well as general preventive health recommendations were provided to patient.     Clemetine Marker, LPN   [id number]/[id number]   Nurse Notes: pt states she broke right pinky toe approx 1 year ago and it healed but 3-4 months ago she noticed a sore on outside of toe and it causes discomfort mostly at night. Pt states she has not gotten any new shoes that would have caused sore. Pinky toe was not bruised and sore is size of pencil eraser, flat, slight redness but not inflamed. Pt inquired about xray but not bruising, pain or difficulty walking. Pt advised would notifiy Dr. Army Melia and to let us know if sxs worsen or persist for possible referral to podiatry.   Pt also c/o stress; did not discuss. Pt also sees psych and has therpaist. PHQ 9 score today of 22. Pt also lost another 1.5# since last visit. BMI down to 14.47. Pt states she drinks Boost and carnation instant breakfast but still no appetite.

## 2020-03-21 NOTE — Patient Instructions (Signed)
Carolyn Dunlap , Thank you for taking time to come for your Medicare Wellness Visit. I appreciate your ongoing commitment to your health goals. Please review the following plan we discussed and let me know if I can assist you in the future.   Screening recommendations/referrals: Colonoscopy: done 06/3017. Repeat in 2024.  Mammogram: done 05/06/19. Please call [phone] to schedule your mammogram.  Bone Density: done 05/06/19 Recommended yearly ophthalmology/optometry visit for glaucoma screening and checkup Recommended yearly dental visit for hygiene and checkup  Vaccinations: Influenza vaccine: due Pneumococcal vaccine: done 05/29/16 Tdap vaccine: due Shingles vaccine: Shingrix discussed. Please contact your pharmacy for vaccine date information.  Covid-19:done 07/29/19 & 08/19/19  Advanced directives: Please bring a copy of your health care power of attorney and living will to the office at your convenience.  Conditions/risks identified: Recommend increasing physical activity.   Next appointment: Follow up in one year for your annual wellness visit    Preventive Care 65 Years and Older, Female Preventive care refers to lifestyle choices and visits with your health care provider that can promote health and wellness. What does preventive care include?  A yearly physical exam. This is also called an annual well check.  Dental exams once or twice a year.  Routine eye exams. Ask your health care provider how often you should have your eyes checked.  Personal lifestyle choices, including:  Daily care of your teeth and gums.  Regular physical activity.  Eating a healthy diet.  Avoiding tobacco and drug use.  Limiting alcohol use.  Practicing safe sex.  Taking low-dose aspirin every day.  Taking vitamin and mineral supplements as recommended by your health care provider. What happens during an annual well check? The services and screenings done by your health care provider  during your annual well check will depend on your age, overall health, lifestyle risk factors, and family history of disease. Counseling  Your health care provider may ask you questions about your:  Alcohol use.  Tobacco use.  Drug use.  Emotional well-being.  Home and relationship well-being.  Sexual activity.  Eating habits.  History of falls.  Memory and ability to understand (cognition).  Work and work Statistician.  Reproductive health. Screening  You may have the following tests or measurements:  Height, weight, and BMI.  Blood pressure.  Lipid and cholesterol levels. These may be checked every 5 years, or more frequently if you are over 58 years old.  Skin check.  Lung cancer screening. You may have this screening every year starting at age 75 if you have a 30-pack-year history of smoking and currently smoke or have quit within the past 15 years.  Fecal occult blood test (FOBT) of the stool. You may have this test every year starting at age 30.  Flexible sigmoidoscopy or colonoscopy. You may have a sigmoidoscopy every 5 years or a colonoscopy every 10 years starting at age 17.  Hepatitis C blood test.  Hepatitis B blood test.  Sexually transmitted disease (STD) testing.  Diabetes screening. This is done by checking your blood sugar (glucose) after you have not eaten for a while (fasting). You may have this done every 1-3 years.  Bone density scan. This is done to screen for osteoporosis. You may have this done starting at age 64.  Mammogram. This may be done every 1-2 years. Talk to your health care provider about how often you should have regular mammograms. Talk with your health care provider about your test results, treatment options, and if  necessary, the need for more tests. Vaccines  Your health care provider may recommend certain vaccines, such as:  Influenza vaccine. This is recommended every year.  Tetanus, diphtheria, and acellular pertussis  (Tdap, Td) vaccine. You may need a Td booster every 10 years.  Zoster vaccine. You may need this after age 80.  Pneumococcal 13-valent conjugate (PCV13) vaccine. One dose is recommended after age 72.  Pneumococcal polysaccharide (PPSV23) vaccine. One dose is recommended after age 25. Talk to your health care provider about which screenings and vaccines you need and how often you need them. This information is not intended to replace advice given to you by your health care provider. Make sure you discuss any questions you have with your health care provider. Document Released: 06/24/2015 Document Revised: 02/15/2016 Document Reviewed: 03/29/2015 Elsevier Interactive Patient Education  2017 [place] Prevention in the Home Falls can cause injuries. They can happen to people of all ages. There are many things you can do to make your home safe and to help prevent falls. What can I do on the outside of my home?  Regularly fix the edges of walkways and driveways and fix any cracks.  Remove anything that might make you trip as you walk through a door, such as a raised step or threshold.  Trim any bushes or trees on the path to your home.  Use bright outdoor lighting.  Clear any walking paths of anything that might make someone trip, such as rocks or tools.  Regularly check to see if handrails are loose or broken. Make sure that both sides of any steps have handrails.  Any raised decks and porches should have guardrails on the edges.  Have any leaves, snow, or ice cleared regularly.  Use sand or salt on walking paths during winter.  Clean up any spills in your garage right away. This includes oil or grease spills. What can I do in the bathroom?  Use night lights.  Install grab bars by the toilet and in the tub and shower. Do not use towel bars as grab bars.  Use non-skid mats or decals in the tub or shower.  If you need to sit down in the shower, use a plastic, non-slip  stool.  Keep the floor dry. Clean up any water that spills on the floor as soon as it happens.  Remove soap buildup in the tub or shower regularly.  Attach bath mats securely with double-sided non-slip rug tape.  Do not have throw rugs and other things on the floor that can make you trip. What can I do in the bedroom?  Use night lights.  Make sure that you have a light by your bed that is easy to reach.  Do not use any sheets or blankets that are too big for your bed. They should not hang down onto the floor.  Have a firm chair that has side arms. You can use this for support while you get dressed.  Do not have throw rugs and other things on the floor that can make you trip. What can I do in the kitchen?  Clean up any spills right away.  Avoid walking on wet floors.  Keep items that you use a lot in easy-to-reach places.  If you need to reach something above you, use a strong step stool that has a grab bar.  Keep electrical cords out of the way.  Do not use floor polish or wax that makes floors slippery. If you must  use wax, use non-skid floor wax.  Do not have throw rugs and other things on the floor that can make you trip. What can I do with my stairs?  Do not leave any items on the stairs.  Make sure that there are handrails on both sides of the stairs and use them. Fix handrails that are broken or loose. Make sure that handrails are as long as the stairways.  Check any carpeting to make sure that it is firmly attached to the stairs. Fix any carpet that is loose or worn.  Avoid having throw rugs at the top or bottom of the stairs. If you do have throw rugs, attach them to the floor with carpet tape.  Make sure that you have a light switch at the top of the stairs and the bottom of the stairs. If you do not have them, ask someone to add them for you. What else can I do to help prevent falls?  Wear shoes that:  Do not have high heels.  Have rubber bottoms.  Are  comfortable and fit you well.  Are closed at the toe. Do not wear sandals.  If you use a stepladder:  Make sure that it is fully opened. Do not climb a closed stepladder.  Make sure that both sides of the stepladder are locked into place.  Ask someone to hold it for you, if possible.  Clearly mark and make sure that you can see:  Any grab bars or handrails.  First and last steps.  Where the edge of each step is.  Use tools that help you move around (mobility aids) if they are needed. These include:  Canes.  Walkers.  Scooters.  Crutches.  Turn on the lights when you go into a dark area. Replace any light bulbs as soon as they burn out.  Set up your furniture so you have a clear path. Avoid moving your furniture around.  If any of your floors are uneven, fix them.  If there are any pets around you, be aware of where they are.  Review your medicines with your doctor. Some medicines can make you feel dizzy. This can increase your chance of falling. Ask your doctor what other things that you can do to help prevent falls. This information is not intended to replace advice given to you by your health care provider. Make sure you discuss any questions you have with your health care provider. Document Released: 03/24/2009 Document Revised: 11/03/2015 Document Reviewed: 07/02/2014 Elsevier Interactive Patient Education  2017 [employer].

## 2020-04-10 ENCOUNTER — Telehealth: Payer: Self-pay | Admitting: *Deleted

## 2020-04-10 DIAGNOSIS — Z122 Encounter for screening for malignant neoplasm of respiratory organs: Secondary | ICD-10-CM

## 2020-04-10 DIAGNOSIS — Z87891 Personal history of nicotine dependence: Secondary | ICD-10-CM

## 2020-04-10 NOTE — Telephone Encounter (Signed)
Informed patient that it is time to schedule her lung cancer screening scan. Patient is a current cigarette smoker. She smokes 1/2 pack per day. Appointment scheduled for 04/20/2020 at 12:30.

## 2020-04-11 NOTE — Addendum Note (Signed)
Addended by: Lieutenant Diego on: 04/11/2020 10:53 AM   Modules accepted: Orders

## 2020-04-11 NOTE — Telephone Encounter (Signed)
Current smoker, 30.5 pack year

## 2020-04-19 DIAGNOSIS — F17210 Nicotine dependence, cigarettes, uncomplicated: Secondary | ICD-10-CM | POA: Diagnosis not present

## 2020-04-19 DIAGNOSIS — G3184 Mild cognitive impairment, so stated: Secondary | ICD-10-CM | POA: Diagnosis not present

## 2020-04-19 DIAGNOSIS — F332 Major depressive disorder, recurrent severe without psychotic features: Secondary | ICD-10-CM | POA: Diagnosis not present

## 2020-04-19 DIAGNOSIS — Z79899 Other long term (current) drug therapy: Secondary | ICD-10-CM | POA: Diagnosis not present

## 2020-04-19 DIAGNOSIS — F418 Other specified anxiety disorders: Secondary | ICD-10-CM | POA: Diagnosis not present

## 2020-04-19 DIAGNOSIS — F17200 Nicotine dependence, unspecified, uncomplicated: Secondary | ICD-10-CM | POA: Diagnosis not present

## 2020-04-19 DIAGNOSIS — R636 Underweight: Secondary | ICD-10-CM | POA: Diagnosis not present

## 2020-04-20 ENCOUNTER — Ambulatory Visit
Admission: RE | Admit: 2020-04-20 | Discharge: 2020-04-20 | Disposition: A | Discharge status: Home / Self Care | Payer: Medicare Other | Source: Ambulatory Visit | Attending: Nurse Practitioner | Admitting: Nurse Practitioner

## 2020-04-20 ENCOUNTER — Other Ambulatory Visit: Payer: Self-pay

## 2020-04-20 DIAGNOSIS — F17210 Nicotine dependence, cigarettes, uncomplicated: Secondary | ICD-10-CM | POA: Diagnosis not present

## 2020-04-20 DIAGNOSIS — Z122 Encounter for screening for malignant neoplasm of respiratory organs: Secondary | ICD-10-CM | POA: Diagnosis not present

## 2020-04-20 DIAGNOSIS — Z87891 Personal history of nicotine dependence: Secondary | ICD-10-CM | POA: Diagnosis not present

## 2020-04-21 ENCOUNTER — Encounter: Payer: Self-pay | Admitting: *Deleted

## 2020-04-22 ENCOUNTER — Telehealth: Payer: Self-pay | Admitting: Internal Medicine

## 2020-04-22 NOTE — Telephone Encounter (Signed)
The CT of her lungs showed no evidence of cancer but there was some unexpected fluid at the base.  I recommend that she see a Pulmonologist to see if additional testing is needed.

## 2020-04-25 ENCOUNTER — Telehealth: Payer: Self-pay

## 2020-04-25 ENCOUNTER — Other Ambulatory Visit: Payer: Self-pay

## 2020-04-25 DIAGNOSIS — R918 Other nonspecific abnormal finding of lung field: Secondary | ICD-10-CM

## 2020-04-25 NOTE — Telephone Encounter (Signed)
Called and informed patient of CT lung screening results from Dr Oneal Deputy note from 04/22/2020. Placed pulmonology referral for patient and she agreed to see them.

## 2020-05-17 DIAGNOSIS — F17210 Nicotine dependence, cigarettes, uncomplicated: Secondary | ICD-10-CM | POA: Diagnosis not present

## 2020-05-17 DIAGNOSIS — G3184 Mild cognitive impairment, so stated: Secondary | ICD-10-CM | POA: Diagnosis not present

## 2020-05-17 DIAGNOSIS — F332 Major depressive disorder, recurrent severe without psychotic features: Secondary | ICD-10-CM | POA: Diagnosis not present

## 2020-05-17 DIAGNOSIS — R636 Underweight: Secondary | ICD-10-CM | POA: Diagnosis not present

## 2020-05-17 DIAGNOSIS — Z79899 Other long term (current) drug therapy: Secondary | ICD-10-CM | POA: Diagnosis not present

## 2020-05-17 DIAGNOSIS — F418 Other specified anxiety disorders: Secondary | ICD-10-CM | POA: Diagnosis not present

## 2020-05-20 ENCOUNTER — Encounter: Payer: Self-pay | Admitting: Internal Medicine

## 2020-05-20 ENCOUNTER — Ambulatory Visit (INDEPENDENT_AMBULATORY_CARE_PROVIDER_SITE_OTHER): Payer: Medicare Other | Admitting: Internal Medicine

## 2020-05-20 ENCOUNTER — Other Ambulatory Visit
Admission: RE | Admit: 2020-05-20 | Discharge: 2020-05-20 | Disposition: A | Discharge status: Home / Self Care | Payer: Medicare Other | Source: Ambulatory Visit | Attending: Internal Medicine | Admitting: Internal Medicine

## 2020-05-20 ENCOUNTER — Other Ambulatory Visit: Payer: Self-pay

## 2020-05-20 VITALS — BP 120/72 | HR 76 | Temp 97.8°F | Ht 67.0 in | Wt 93.4 lb

## 2020-05-20 DIAGNOSIS — Z20822 Contact with and (suspected) exposure to covid-19: Secondary | ICD-10-CM | POA: Diagnosis not present

## 2020-05-20 DIAGNOSIS — J439 Emphysema, unspecified: Secondary | ICD-10-CM

## 2020-05-20 DIAGNOSIS — Z01812 Encounter for preprocedural laboratory examination: Secondary | ICD-10-CM | POA: Insufficient documentation

## 2020-05-20 DIAGNOSIS — J90 Pleural effusion, not elsewhere classified: Secondary | ICD-10-CM

## 2020-05-20 DIAGNOSIS — Z87891 Personal history of nicotine dependence: Secondary | ICD-10-CM | POA: Diagnosis not present

## 2020-05-20 MED ORDER — SPIRIVA RESPIMAT 1.25 MCG/ACT IN AERS
2.0000 | INHALATION_SPRAY | Freq: Every day | RESPIRATORY_TRACT | 11 refills | Status: DC
Start: 2020-05-20 — End: 2024-01-14

## 2020-05-20 MED ORDER — ALBUTEROL SULFATE HFA 108 (90 BASE) MCG/ACT IN AERS: 2.0000 | INHALATION_SPRAY | Freq: Four times a day (QID) | RESPIRATORY_TRACT | 2 refills | Status: DC | PRN

## 2020-05-20 NOTE — Patient Instructions (Addendum)
Pleural effusion, left History of smoking 30 or more pack years Chest pains 10 pound weight loss  - you have new fluid collection of unclear cause -pain can be from this  Plan  - The fluid removal will be done by Interventional radiology or Ultrasound thora   - they will remove not more than 1.5L  - fluid labs to be sent: cell count, gram stain and culture, cytology for malignant cells, chemistries for LDH, albumin,  Protein, glucose, triglyceride and lipase  - blood labs to be sent within 48h of test: cbc, ldh, protein, albumin glucose, and lipase, quanitferon gold   - blood work - ANA, DS DNA, RF, CCP, ESR  Pulmonary emphysema, unspecified emphysema type ([place])  -you have this and is due to smoking  Plan  -start spiriva respimat daily 2 puff with albuterol as needd - check alpha 1 phenotype  Chest pains   - likely due to pleural fluids  Plan  - if does not resolve with thoracentesis, need cards referral  Followup  - with an APP after thoracentesis or Dr Chase Caller

## 2020-05-20 NOTE — Progress Notes (Signed)
OV 05/20/2020  Subjective:  Patient ID: Carolyn Dunlap, female , DOB: [birth year] , age 69 y.o. , MRN: [id number] , ADDRESS: [place] [id number] PCP Glean Hess, MD Patient Care Team: Glean Hess, MD as PCP - General (Internal Medicine) Ulyess Blossom (Psychiatry)  This Provider for this visit: Treatment Team:  Attending Provider: Brand Males, MD    05/20/2020 -   Chief Complaint  Patient presents with  . Consult    CT 04/20/2020     HPI Carolyn Dunlap 69 y.o. -presents with a new evaluation for left pleural effusion.  She reports a long history of smoking greater than 30 pack/day.  1 year ago she had low resolution CT scan of the chest for cancer screening without any evidence of nodules.  Then approximately starting sometime after that in the last 6-12 months she started noticing atypical chest pains in the left infra axillary area left mammary area in the left infrascapular area.  These are transient fleeting pains.  It will happen when she is sitting or standing its not exertional.  There is kind of pinpoint.  She feels a little bit stretched in her left lower chest.  She had routine low-dose CT scan of the chest in our 2021 that I personally visualized.  She has new onset left pleural effusion it is small.  It might be loculated.  There is no undue shortness of breath or other symptoms.  The CT scan of the chest also shows associated emphysema.  She is here for evaluation.  She is reporting associated weight loss 10 pound weight loss     CT chest 04/20/20    IMPRESSION: 1. Lung-RADS 1, negative. Continue annual screening with low-dose chest CT without contrast in 12 months. 2. Development of a small left pleural effusion, nonspecific. Given persistent right base and new left base mild smooth septal thickening, pulmonary edema should be considered. 3. Similar mild thoracic adenopathy, favored to be reactive. 4. Aortic  Atherosclerosis (ICD10-I70.0) and Emphysema (ICD10-J43.9).   Electronically Signed   By: Abigail Miyamoto M.D.   On: 04/20/2020 15:16    ROS - per HPI Results for Carolyn Dunlap, Carolyn Dunlap (MRN [id number]) as of 05/20/2020 10:49  Ref. Range 01/06/2020 10:09  Creatinine Latest Ref Range: 0.57 - 1.00 mg/dL 0.70  Results for Carolyn Dunlap, Carolyn Dunlap (MRN [id number]) as of 05/20/2020 10:49  Ref. Range 01/06/2020 10:09  Hemoglobin Latest Ref Range: 11.1 - 15.9 g/dL 11.4      has a past medical history of Depression, GERD (gastroesophageal reflux disease), Insomnia, Migraine headache, and PONV (postoperative nausea and vomiting).   reports that she has been smoking cigarettes. She has a 30.00 pack-year smoking history. She has never used smokeless tobacco.  Past Surgical History:  Procedure Laterality Date  . AUGMENTATION MAMMAPLASTY Bilateral 1976  . BREAST ENHANCEMENT SURGERY  1976  . CATARACT EXTRACTION  2002  . COLONOSCOPY WITH PROPOFOL N/A 07/10/2017   Procedure: COLONOSCOPY WITH PROPOFOL;  Surgeon: Lin Landsman, MD;  Location: [facility];  Service: Endoscopy;  Laterality: N/A;  . POLYPECTOMY N/A 07/10/2017   Procedure: POLYPECTOMY;  Surgeon: Lin Landsman, MD;  Location: [facility];  Service: Endoscopy;  Laterality: N/A;    Allergies  Allergen Reactions  . Zoloft [Sertraline Hcl]     diaphoresis  . Adhesive [Tape] Rash    Band-aids  . Penicillins Rash    Immunization History  Administered Date(s) Administered  .  Influenza,inj,quad, With Preservative 04/04/2018  . Influenza-Unspecified 04/08/2017, 03/12/2019, 03/22/2020  . PFIZER SARS-COV-2 Vaccination 07/29/2019, 08/19/2019  . Pneumococcal Conjugate-13 05/26/2015  . Pneumococcal Polysaccharide-23 05/29/2016  . Zoster 02/09/2011  . Zoster Recombinat (Shingrix) 06/25/2017, 06/10/2018    Family History  Problem Relation Age of Onset  . Hyperlipidemia Mother   . Hypertension Mother   . Hypertension Father    . Breast cancer Neg Hx      Current Outpatient Medications:  .  ARIPiprazole (ABILIFY) 5 MG tablet, Take 5 mg by mouth daily., Disp: , Rfl:  .  buPROPion (WELLBUTRIN XL) 150 MG 24 hr tablet, Take 1 tablet (150 mg total) by mouth daily. (Patient taking differently: Take 300 mg by mouth daily.), Disp: 30 tablet, Rfl: 1 .  Calcium-Magnesium-Vitamin D (CALCIUM 500 PO), Take by mouth., Disp: , Rfl:  .  cyclobenzaprine (FLEXERIL) 10 MG tablet, Take 1 tablet (10 mg total) by mouth at bedtime., Disp: 30 tablet, Rfl: 1 .  cyproheptadine (PERIACTIN) 4 MG tablet, Take 1 tablet by mouth in the morning, at noon, in the evening, and at bedtime., Disp: , Rfl:  .  famotidine (PEPCID) 20 MG tablet, Take 1 tablet (20 mg total) by mouth 2 (two) times daily. (Patient taking differently: Take 20 mg by mouth 2 (two) times daily. PRN only), Disp: 60 tablet, Rfl: 1 .  Multiple Vitamin (MULTI-DAY PO), Take by mouth., Disp: , Rfl:  .  SUMAtriptan (IMITREX) 100 MG tablet, TAKE 1 TABLET DAILY AS     DIRECTED BY YOUR DOCTOR, Disp: 27 tablet, Rfl: 3 .  traZODone (DESYREL) 100 MG tablet, , Disp: , Rfl:  .  zolpidem (AMBIEN) 10 MG tablet, TAKE 1 TABLET AT BEDTIME. (Patient taking differently: Prescribed by Dr. Hassan Buckler), Disp: 90 tablet, Rfl: 1      Objective:   Vitals:   05/20/20 1047  BP: 120/72  Pulse: 76  Temp: 97.8 F (36.6 C)  TempSrc: Temporal  SpO2: 97%  Weight: 93 lb 6.4 oz (42.4 kg)  Height: _0  (1.702 m)    Estimated body mass index is 14.63 kg/m as calculated from the following:   Height as of this encounter: _1  (1.702 m).   Weight as of this encounter: 93 lb 6.4 oz (42.4 kg).  _2 @  [employer]   05/20/20 1047  Weight: 93 lb 6.4 oz (42.4 kg)     Physical Exam  General Appearance:    Alert, cooperative, no distress, appears stated age - older , Deconditioned looking - yes , OBESE  -no but she is thin and frail, Sitting on Wheelchair -  no  Head:    Normocephalic, without  obvious abnormality, atraumatic  Eyes:    PERRL, conjunctiva/corneas clear,  Ears:    Normal TM's and external ear canals, both ears  Nose:   Nares normal, septum midline, mucosa normal, no drainage    or sinus tenderness. OXYGEN ON  - no . Patient is @ ra   Throat:   Lips, mucosa, and tongue normal; teeth and gums normal. Cyanosis on lips - no  Neck:   Supple, symmetrical, trachea midline, no adenopathy;    thyroid:  no enlargement/tenderness/nodules; no carotid   bruit or JVD  Back:     Symmetric, no curvature, ROM normal, no CVA tenderness  Lungs:     Distress - no , Wheeze no, Barrell Chest - no, Purse lip breathing - no, Crackles - no   Chest Wall:    No tenderness or deformity.  Heart:    Regular rate and rhythm, S1 and S2 normal, no rub   or gallop, Murmur - no  Breast Exam:    NOT DONE  Abdomen:     Soft, non-tender, bowel sounds active all four quadrants,    no masses, no organomegaly. Visceral obesity - no  Genitalia:   NOT DONE  Rectal:   NOT DONE  Extremities:   Extremities - normal, Has Cane - no, Clubbing - no, Edema - no  Pulses:   2+ and symmetric all extremities  Skin:   Stigmata of Connective Tissue Disease - no  Lymph nodes:   Cervical, supraclavicular, and axillary nodes normal  Psychiatric:  Neurologic:   Pleasant - yes, Anxious - no, Flat affect - no  CAm-ICU - neg, Alert and Oriented x 3 - yes, Moves all 4s - yes, Speech - normal, Cognition - intact         Assessment:       ICD-10-CM   1. Pleural effusion, left  J90   2. History of smoking 30 or more pack years  Z87.891   3. Pulmonary emphysema, unspecified emphysema type ([place])  J43.9        Plan:     Patient Instructions  Pleural effusion, left History of smoking 30 or more pack years Chest pains 10 pound weight loss  - you have new fluid collection of unclear cause -pain can be from this  Plan  - The fluid removal will be done by Interventional radiology or Ultrasound thora   - they  will remove not more than 1.5L  - fluid labs to be sent: cell count, gram stain and culture, cytology for malignant cells, chemistries for LDH, albumin,  Protein, glucose, triglyceride and lipase  - blood labs to be sent within 48h of test: cbc, ldh, protein, albumin glucose, and lipase, quanitferon gold   - blood work - ANA, DS DNA, RF, CCP, ESR  Pulmonary emphysema, unspecified emphysema type ([place])  -you have this and is due to smoking  Plan  -start spiriva respimat daily 2 puff with albuterol as needd - check alpha 1 phenotype  Chest pains   - likely due to pleural fluids  Plan  - if does not resolve with thoracentesis, need cards referral  Followup  - with an APP after thoracentesis or Dr Gaye Alken    Dr. Brand Males, M.D., F.C.C.P,  Pulmonary and Critical Care Medicine Staff Physician, [facility] Director - Interstitial Lung Disease  Program  [facility] [place], [place], [address]  Pager: [phone], If no answer or between  15:00h - 7:00h: call [phone] Telephone: [phone]  11:19 AM 05/20/2020

## 2020-05-21 ENCOUNTER — Other Ambulatory Visit
Admission: RE | Admit: 2020-05-21 | Discharge: 2020-05-21 | Disposition: A | Discharge status: Home / Self Care | Payer: Medicare Other | Attending: Internal Medicine | Admitting: Internal Medicine

## 2020-05-21 DIAGNOSIS — J90 Pleural effusion, not elsewhere classified: Secondary | ICD-10-CM

## 2020-05-21 DIAGNOSIS — J439 Emphysema, unspecified: Secondary | ICD-10-CM | POA: Diagnosis not present

## 2020-05-21 LAB — CBC WITH DIFFERENTIAL/PLATELET
Abs Immature Granulocytes: 0.01 10*3/uL (ref 0.00–0.07)
Basophils Absolute: 0 10*3/uL (ref 0.0–0.1)
Basophils Relative: 1 %
Eosinophils Absolute: 0 10*3/uL (ref 0.0–0.5)
Eosinophils Relative: 1 %
HCT: 35.6 % — ABNORMAL LOW (ref 36.0–46.0)
Hemoglobin: 11.8 g/dL — ABNORMAL LOW (ref 12.0–15.0)
Immature Granulocytes: 0 %
Lymphocytes Relative: 14 %
Lymphs Abs: 0.8 10*3/uL (ref 0.7–4.0)
MCH: 29.1 pg (ref 26.0–34.0)
MCHC: 33.1 g/dL (ref 30.0–36.0)
MCV: 87.7 fL (ref 80.0–100.0)
Monocytes Absolute: 0.3 10*3/uL (ref 0.1–1.0)
Monocytes Relative: 5 %
Neutro Abs: 4.1 10*3/uL (ref 1.7–7.7)
Neutrophils Relative %: 79 %
Platelets: 201 10*3/uL (ref 150–400)
RBC: 4.06 MIL/uL (ref 3.87–5.11)
RDW: 17.5 % — ABNORMAL HIGH (ref 11.5–15.5)
WBC: 5.2 10*3/uL (ref 4.0–10.5)
nRBC: 0 % (ref 0.0–0.2)

## 2020-05-21 LAB — ALBUMIN: Albumin: 3.7 g/dL (ref 3.5–5.0)

## 2020-05-21 LAB — LACTATE DEHYDROGENASE: LDH: 188 U/L (ref 98–192)

## 2020-05-21 LAB — SARS CORONAVIRUS 2 (TAT 6-24 HRS): SARS Coronavirus 2: NEGATIVE

## 2020-05-21 LAB — PROTEIN, TOTAL: Total Protein: 8 g/dL (ref 6.5–8.1)

## 2020-05-21 LAB — SEDIMENTATION RATE: Sed Rate: 39 mm/hr — ABNORMAL HIGH (ref 0–30)

## 2020-05-23 ENCOUNTER — Other Ambulatory Visit
Admission: RE | Admit: 2020-05-23 | Discharge: 2020-05-23 | Disposition: A | Discharge status: Home / Self Care | Payer: Medicare Other | Source: Ambulatory Visit | Attending: Internal Medicine | Admitting: Internal Medicine

## 2020-05-23 ENCOUNTER — Ambulatory Visit
Admission: RE | Admit: 2020-05-23 | Discharge: 2020-05-23 | Disposition: A | Discharge status: Home / Self Care | Payer: Medicare Other | Source: Ambulatory Visit | Attending: Internal Medicine | Admitting: Internal Medicine

## 2020-05-23 ENCOUNTER — Other Ambulatory Visit: Payer: Self-pay

## 2020-05-23 ENCOUNTER — Ambulatory Visit
Admission: RE | Admit: 2020-05-23 | Discharge: 2020-05-23 | Disposition: A | Discharge status: Home / Self Care | Payer: Medicare Other | Source: Ambulatory Visit | Attending: Radiology | Admitting: Radiology

## 2020-05-23 DIAGNOSIS — J90 Pleural effusion, not elsewhere classified: Secondary | ICD-10-CM | POA: Diagnosis not present

## 2020-05-23 DIAGNOSIS — Z9889 Other specified postprocedural states: Secondary | ICD-10-CM

## 2020-05-23 LAB — GRAM STAIN: Gram Stain: NONE SEEN

## 2020-05-23 LAB — BODY FLUID CELL COUNT WITH DIFFERENTIAL
Eos, Fluid: 0 %
Lymphs, Fluid: 94 %
Monocyte-Macrophage-Serous Fluid: 5 %
Neutrophil Count, Fluid: 1 %
Total Nucleated Cell Count, Fluid: 845 cu mm

## 2020-05-23 LAB — GLUCOSE, PLEURAL OR PERITONEAL FLUID: Glucose, Fluid: 67 mg/dL

## 2020-05-23 LAB — LIPASE, BLOOD: Lipase: 27 U/L (ref 11–51)

## 2020-05-23 LAB — LACTATE DEHYDROGENASE, PLEURAL OR PERITONEAL FLUID: LD, Fluid: 74 U/L — ABNORMAL HIGH (ref 3–23)

## 2020-05-23 LAB — ALBUMIN, PLEURAL OR PERITONEAL FLUID: Albumin, Fluid: <1 g/dL

## 2020-05-23 LAB — PROTEIN, PLEURAL OR PERITONEAL FLUID: Total protein, fluid: <3 g/dL

## 2020-05-23 LAB — ANTI-DNA ANTIBODY, DOUBLE-STRANDED: ds DNA Ab: 1 IU/mL (ref 0–9)

## 2020-05-23 NOTE — Procedures (Signed)
Ultrasound-guided diagnostic left sided  thoracentesis performed yielding 200 militers of amber colored fluid. No immediate complications.   Diagnostic fluid was sent to the lab for further analysis. Follow-up chest x-ray pending. EBL is < 2 ml. Patient reporting chest pressure and unable to tolerate additional fluid removal.

## 2020-05-24 LAB — PROTEIN, BODY FLUID (OTHER): Total Protein, Body Fluid Other: 1.9 g/dL

## 2020-05-24 LAB — RHEUMATOID FACTOR: Rheumatoid fact SerPl-aCnc: <10 IU/mL (ref <14.0)

## 2020-05-24 LAB — CYTOLOGY - NON PAP

## 2020-05-24 LAB — TRIGLYCERIDES, BODY FLUIDS: Triglycerides, Fluid: <9 mg/dL

## 2020-05-24 LAB — ALPHA-1 ANTITRYPSIN PHENOTYPE: A-1 Antitrypsin, Ser: 192 mg/dL — ABNORMAL HIGH (ref 101–187)

## 2020-05-24 LAB — CYCLIC CITRUL PEPTIDE ANTIBODY, IGG/IGA: CCP Antibodies IgG/IgA: 9 units (ref 0–19)

## 2020-05-24 LAB — ANA W/REFLEX: Anti Nuclear Antibody (ANA): POSITIVE — AB

## 2020-05-25 LAB — QUANTIFERON-TB GOLD PLUS (RQFGPL)
QuantiFERON Mitogen Value: 1.37 IU/mL
QuantiFERON Nil Value: 0 IU/mL
QuantiFERON TB1 Ag Value: 0 IU/mL
QuantiFERON TB2 Ag Value: 0 IU/mL

## 2020-05-25 LAB — QUANTIFERON-TB GOLD PLUS: QuantiFERON-TB Gold Plus: NEGATIVE

## 2020-05-28 LAB — LIPASE, FLUID: Lipase-Fluid: 3 U/L

## 2020-05-31 ENCOUNTER — Telehealth: Payer: Self-pay | Admitting: Internal Medicine

## 2020-05-31 DIAGNOSIS — R9431 Abnormal electrocardiogram [ECG] [EKG]: Secondary | ICD-10-CM

## 2020-05-31 DIAGNOSIS — J90 Pleural effusion, not elsewhere classified: Secondary | ICD-10-CM

## 2020-05-31 NOTE — Telephone Encounter (Signed)
Called and spoke with patient who is asking results from thoracentesis.   Dr.Ramaswamy please advise. Pt can be reached at [phone]

## 2020-06-01 NOTE — Telephone Encounter (Signed)
There are 2 types of fluid - inflmmatory v leaky. Her results sugges latter (leaky) fluid. No cancer cells in fluid - whiuch means non-diagnostic for cancer cells. Blood test for TB is negative. Blood autoimmune panel is negative. This overall means we do not know the reason for the fluid but need to rule out heart issues  Plan  - she has appt with TP 1/4/222 to discuss these results  = meanwhile get ECHO to ensure weak heart is not reason for "leaky" pleural fluidss -> if thius is also negative then we can look at liver/kidney  For protein leak  I wish I can call her today but am rounding in the covid icu. You can certainly send message back after you give her the above and I can try bu tthe appt with TP 06/14/20 is to go over all this

## 2020-06-01 NOTE — Telephone Encounter (Signed)
Spoke with pt, aware of results/recs.  Echo ordered.  Nothing further needed at this time- will route back to MR as FYI.  No callback is needed at this time.

## 2020-06-02 ENCOUNTER — Ambulatory Visit (HOSPITAL_COMMUNITY)
Admission: RE | Admit: 2020-06-02 | Discharge: 2020-06-02 | Disposition: A | Discharge status: Home / Self Care | Payer: Medicare Other | Source: Ambulatory Visit | Attending: Internal Medicine | Admitting: Internal Medicine

## 2020-06-02 ENCOUNTER — Other Ambulatory Visit: Payer: Self-pay

## 2020-06-02 DIAGNOSIS — I082 Rheumatic disorders of both aortic and tricuspid valves: Secondary | ICD-10-CM | POA: Insufficient documentation

## 2020-06-02 DIAGNOSIS — J90 Pleural effusion, not elsewhere classified: Secondary | ICD-10-CM | POA: Diagnosis not present

## 2020-06-02 DIAGNOSIS — R9431 Abnormal electrocardiogram [ECG] [EKG]: Secondary | ICD-10-CM | POA: Diagnosis not present

## 2020-06-02 LAB — ECHOCARDIOGRAM COMPLETE
Area-P 1/2: 4.31 cm2
S' Lateral: 2.2 cm

## 2020-06-02 NOTE — Progress Notes (Signed)
  Echocardiogram 2D Echocardiogram has been performed.  Carolyn Dunlap 06/02/2020, 12:59 PM

## 2020-06-04 NOTE — Progress Notes (Signed)
Echo normla but for mild muscle stiffness.  Plan -> get RUQ [place] of liver ideally before seeing Carolyn Dunlap on 06/14/20

## 2020-06-07 ENCOUNTER — Other Ambulatory Visit: Payer: Self-pay | Admitting: Internal Medicine

## 2020-06-07 DIAGNOSIS — J90 Pleural effusion, not elsewhere classified: Secondary | ICD-10-CM

## 2020-06-08 ENCOUNTER — Telehealth: Payer: Self-pay | Admitting: Internal Medicine

## 2020-06-08 ENCOUNTER — Other Ambulatory Visit: Payer: Self-pay | Admitting: Internal Medicine

## 2020-06-08 DIAGNOSIS — E778 Other disorders of glycoprotein metabolism: Secondary | ICD-10-CM

## 2020-06-08 DIAGNOSIS — J811 Chronic pulmonary edema: Secondary | ICD-10-CM

## 2020-06-08 DIAGNOSIS — K9049 Malabsorption due to intolerance, not elsewhere classified: Secondary | ICD-10-CM

## 2020-06-08 NOTE — Telephone Encounter (Signed)
Spoke to [employer] with [place].  Selena Batten is calling for clarification on RUQ [place]. [place] was ordered for pleural effusion.  Per 05/31/2020 phone note, MR ordered US of liver/kidney for protein leak.  I have provided Selena Batten with this information.  Nothing further needed.

## 2020-06-09 ENCOUNTER — Other Ambulatory Visit: Payer: Self-pay

## 2020-06-09 ENCOUNTER — Ambulatory Visit
Admission: RE | Admit: 2020-06-09 | Discharge: 2020-06-09 | Disposition: A | Discharge status: Home / Self Care | Payer: Medicare Other | Source: Ambulatory Visit | Attending: Internal Medicine | Admitting: Internal Medicine

## 2020-06-09 DIAGNOSIS — K9049 Malabsorption due to intolerance, not elsewhere classified: Secondary | ICD-10-CM | POA: Insufficient documentation

## 2020-06-09 DIAGNOSIS — N2889 Other specified disorders of kidney and ureter: Secondary | ICD-10-CM | POA: Diagnosis not present

## 2020-06-09 DIAGNOSIS — J811 Chronic pulmonary edema: Secondary | ICD-10-CM | POA: Insufficient documentation

## 2020-06-09 DIAGNOSIS — E778 Other disorders of glycoprotein metabolism: Secondary | ICD-10-CM | POA: Diagnosis not present

## 2020-06-09 DIAGNOSIS — Z8719 Personal history of other diseases of the digestive system: Secondary | ICD-10-CM | POA: Diagnosis not present

## 2020-06-09 NOTE — Telephone Encounter (Signed)
Spoke to pt Carolyn Dunlap does not need a pre-cert and I think she will be ok if she has questions she will need to call her ins company [employer]

## 2020-06-14 ENCOUNTER — Ambulatory Visit
Admission: RE | Admit: 2020-06-14 | Discharge: 2020-06-14 | Disposition: A | Discharge status: Home / Self Care | Payer: Medicare Other | Source: Ambulatory Visit | Attending: Adult Health | Admitting: Adult Health

## 2020-06-14 ENCOUNTER — Other Ambulatory Visit: Payer: Self-pay

## 2020-06-14 ENCOUNTER — Ambulatory Visit (INDEPENDENT_AMBULATORY_CARE_PROVIDER_SITE_OTHER): Payer: Medicare Other | Admitting: Adult Health

## 2020-06-14 ENCOUNTER — Encounter: Payer: Self-pay | Admitting: Adult Health

## 2020-06-14 VITALS — BP 116/70 | HR 76 | Temp 97.1°F | Ht 67.0 in | Wt 94.4 lb

## 2020-06-14 DIAGNOSIS — R634 Abnormal weight loss: Secondary | ICD-10-CM

## 2020-06-14 DIAGNOSIS — J449 Chronic obstructive pulmonary disease, unspecified: Secondary | ICD-10-CM

## 2020-06-14 DIAGNOSIS — F17200 Nicotine dependence, unspecified, uncomplicated: Secondary | ICD-10-CM | POA: Diagnosis not present

## 2020-06-14 DIAGNOSIS — J90 Pleural effusion, not elsewhere classified: Secondary | ICD-10-CM | POA: Insufficient documentation

## 2020-06-14 DIAGNOSIS — N2889 Other specified disorders of kidney and ureter: Secondary | ICD-10-CM | POA: Diagnosis not present

## 2020-06-14 DIAGNOSIS — J439 Emphysema, unspecified: Secondary | ICD-10-CM

## 2020-06-14 DIAGNOSIS — E43 Unspecified severe protein-calorie malnutrition: Secondary | ICD-10-CM | POA: Insufficient documentation

## 2020-06-14 DIAGNOSIS — E440 Moderate protein-calorie malnutrition: Secondary | ICD-10-CM | POA: Insufficient documentation

## 2020-06-14 DIAGNOSIS — J811 Chronic pulmonary edema: Secondary | ICD-10-CM | POA: Diagnosis not present

## 2020-06-14 NOTE — Assessment & Plan Note (Signed)
Small left pleural effusion status post thoracentesis. Cytology was negative for malignant cells. Appears transudate of by lights criteria. 2D echo showed no evidence of congestive heart failure. Mild diastolic dysfunction. Ultrasound of the abdomen showed no evidence of liver disease. Incidental kidney abnormality noted. Follow-up scans will be completed. Chest x-ray today. Patient has known emphysema and active smoker. Will check PFTs.

## 2020-06-14 NOTE — Assessment & Plan Note (Signed)
Smoking cessation was discussed. 

## 2020-06-14 NOTE — Progress Notes (Signed)
@Patient  ID: Carolyn Dunlap, female    DOB: 1950-11-25, 70 y.o.   MRN: [id number]  Chief Complaint  Patient presents with  . Follow-up    Referring provider: Glean Hess, MD  HPI: 70 year old female seen for pulmonary consult 05/20/2020 for left small pleural effusion  TEST/EVENTS :  Low-dose CT lung cancer screening 04/20/2020 lung RADS one, small left pleural effusion.  06/14/2020 Follow up : Pleural Effusion  Patient presents for a 1 month follow-up. Patient was seen last month for a pulmonary consult. She participates in the low-dose CT screening program. CT was done 04/20/2020 that showed a small left pleural effusion that was new. Patient was set up for a left thoracentesis via interventional radiology. This was completed on 05/23/2020 with 200 cc of amber-colored fluid with removal. By light's criteria was transudate of. Cytology was negative for malignant cells. Autoimmune and connective tissue labs were negative. 2D echo showed preserved EF with EF at 60 to [id number], grade 1 diastolic dysfunction. No significant valvular disease was noted. Patient was set up for an abdominal ultrasound that was essentially unremarkable except for two rounded solid abnormality seen adjacent to the lower pole of the left kidney. Largest measuring 1.7 cm in diameter. Patient was started on Spiriva for suspected emphysema. Alpha-1 antitrypsin test was normal with phenotype MM and level at 192. Patient says she does feel that the Spiriva has helped. She has minimum cough. Does get short of breath with heavy activity. Says she is somewhat sedentary at home. Patient has had a 30 pound weight loss over the last 3 years. She relates this to anxiety and depression. Says she has lots of family stressors. Patient does have a history of breast implants with surgery at age 39 with no follow-up augmentation surgery. Patient is retired. Lives at home with her husband. Is able to do light housework. And does drive. She  denies any hemoptysis chest pain orthopnea PND or increased leg swelling.       Allergies  Allergen Reactions  . Zoloft [Sertraline Hcl]     diaphoresis  . Adhesive [Tape] Rash    Band-aids  . Penicillins Rash    Immunization History  Administered Date(s) Administered  . Influenza,inj,quad, With Preservative 04/04/2018  . Influenza-Unspecified 04/08/2017, 03/12/2019, 03/22/2020  . PFIZER SARS-COV-2 Vaccination 07/29/2019, 08/19/2019, 03/11/2020  . Pneumococcal Conjugate-13 05/26/2015  . Pneumococcal Polysaccharide-23 05/29/2016  . Zoster 02/09/2011  . Zoster Recombinat (Shingrix) 06/25/2017, 06/10/2018    Past Medical History:  Diagnosis Date  . Depression   . GERD (gastroesophageal reflux disease)   . Insomnia   . Migraine headache    weekly  . PONV (postoperative nausea and vomiting)    in distant past    Tobacco History: Social History   Tobacco Use  Smoking Status Current Every Day Smoker  . Packs/day: 1.00  . Years: 30.00  . Pack years: 30.00  . Types: Cigarettes  Smokeless Tobacco Never Used  Tobacco Comment   0.5PPD 06/14/2020   Ready to quit: No Counseling given: Yes Comment: 0.5PPD 06/14/2020   Outpatient Medications Prior to Visit  Medication Sig Dispense Refill  . albuterol (VENTOLIN HFA) 108 (90 Base) MCG/ACT inhaler Inhale 2 puffs into the lungs every 6 (six) hours as needed for wheezing or shortness of breath. 8 g 2  . ARIPiprazole (ABILIFY) 5 MG tablet Take 5 mg by mouth daily.    Marland Kitchen buPROPion (WELLBUTRIN XL) 150 MG 24 hr tablet Take 1 tablet (150 mg total) by mouth  daily. (Patient taking differently: Take 300 mg by mouth daily.) 30 tablet 1  . Calcium-Magnesium-Vitamin D (CALCIUM 500 PO) Take by mouth.    . cyclobenzaprine (FLEXERIL) 10 MG tablet Take 1 tablet (10 mg total) by mouth at bedtime. 30 tablet 1  . cyproheptadine (PERIACTIN) 4 MG tablet Take 1 tablet by mouth in the morning, at noon, in the evening, and at bedtime.    .  famotidine (PEPCID) 20 MG tablet Take 1 tablet (20 mg total) by mouth 2 (two) times daily. (Patient taking differently: Take 20 mg by mouth 2 (two) times daily. PRN only) 60 tablet 1  . Multiple Vitamin (MULTI-DAY PO) Take by mouth.    . SUMAtriptan (IMITREX) 100 MG tablet TAKE 1 TABLET DAILY AS     DIRECTED BY YOUR DOCTOR 27 tablet 3  . Tiotropium Bromide Monohydrate (SPIRIVA RESPIMAT) 1.25 MCG/ACT AERS Inhale 2 puffs into the lungs daily. 4 g 11  . traZODone (DESYREL) 100 MG tablet     . zolpidem (AMBIEN) 10 MG tablet TAKE 1 TABLET AT BEDTIME. (Patient taking differently: Prescribed by Dr. Hassan Buckler) 90 tablet 1   No facility-administered medications prior to visit.     Review of Systems:   Constitutional:   No  weight loss, night sweats,  Fevers, chills, + fatigue, or  lassitude.  HEENT:   No headaches,  Difficulty swallowing,  Tooth/dental problems, or  Sore throat,                No sneezing, itching, ear ache, nasal congestion, post nasal drip,   CV:  No chest pain,  Orthopnea, PND, swelling in lower extremities, anasarca, dizziness, palpitations, syncope.   GI  No heartburn, indigestion, abdominal pain, nausea, vomiting, diarrhea, change in bowel habits, loss of appetite, bloody stools.   Resp:+ shortness of breath with exertion No excess mucus, no productive cough,  No non-productive cough,  No coughing up of blood.  No change in color of mucus.  No wheezing.  No chest wall deformity  Skin: no rash or lesions.  GU: no dysuria, change in color of urine, no urgency or frequency.  No flank pain, no hematuria   MS:  No joint pain or swelling.  No decreased range of motion.  No back pain.    Physical Exam  BP 116/70 (BP Location: Left Arm, Cuff Size: Normal)   Pulse 76   Temp (!) 97.1 F (36.2 C) (Temporal)   Ht 5\' 7"  (1.702 m)   Wt 94 lb 6.4 oz (42.8 kg)   SpO2 96%   BMI 14.79 kg/m   GEN: A/Ox3; pleasant , NAD, thin    HEENT:  [place]/AT,  EACs-clear, TMs-wnl, NOSE-clear,  THROAT-clear, no lesions, no postnasal drip or exudate noted.   NECK:  Supple w/ fair ROM; no JVD; normal carotid impulses w/o bruits; no thyromegaly or nodules palpated; no lymphadenopathy.    RESP  Clear  P & A; w/o, wheezes/ rales/ or rhonchi. no accessory muscle use, no dullness to percussion  CARD:  RRR, no m/r/g, no peripheral edema, pulses intact, no cyanosis or clubbing.  GI:   Soft & nt; nml bowel sounds; no organomegaly or masses detected.   Musco: Warm bil, no deformities or joint swelling noted.   Neuro: alert, no focal deficits noted.    Skin: Warm, no lesions or rashes    Lab Results:  CBC    Component Value Date/Time   WBC 5.2 05/21/2020 1146   RBC 4.06 05/21/2020 1146  HGB 11.8 (L) 05/21/2020 1146   HGB 11.4 01/06/2020 1009   HCT 35.6 (L) 05/21/2020 1146   HCT 35.4 01/06/2020 1009   PLT 201 05/21/2020 1146   PLT 223 01/06/2020 1009   MCV 87.7 05/21/2020 1146   MCV 89 01/06/2020 1009   MCH 29.1 05/21/2020 1146   MCHC 33.1 05/21/2020 1146   RDW 17.5 (H) 05/21/2020 1146   RDW 15.1 01/06/2020 1009   LYMPHSABS 0.8 05/21/2020 1146   LYMPHSABS 0.9 01/06/2020 1009   MONOABS 0.3 05/21/2020 1146   EOSABS 0.0 05/21/2020 1146   EOSABS 0.1 01/06/2020 1009   BASOSABS 0.0 05/21/2020 1146   BASOSABS 0.0 01/06/2020 1009    BMET    Component Value Date/Time   NA 136 01/06/2020 1009   K 4.0 01/06/2020 1009   CL 99 01/06/2020 1009   CO2 22 01/06/2020 1009   GLUCOSE 72 01/06/2020 1009   BUN 7 (L) 01/06/2020 1009   CREATININE 0.70 01/06/2020 1009   CALCIUM 9.6 01/06/2020 1009   GFRNONAA 90 01/06/2020 1009   GFRAA 104 01/06/2020 1009    BNP No results found for: BNP  ProBNP No results found for: PROBNP  Imaging: US Abdomen Complete  Result Date: 06/09/2020 CLINICAL DATA:  Protein losing enteropathy. EXAM: ABDOMEN ULTRASOUND COMPLETE COMPARISON:  None. FINDINGS: Gallbladder: No gallstones or wall thickening visualized. No sonographic Murphy sign noted  by sonographer. Common bile duct: Diameter: 2 mm which is within normal limits. Liver: No focal lesion identified. Within normal limits in parenchymal echogenicity. Portal vein is patent on color Doppler imaging with normal direction of blood flow towards the liver. IVC: No abnormality visualized. Pancreas: Visualized portion unremarkable. Spleen: Size and appearance within normal limits. Right Kidney: Length: 10.7 cm. Echogenicity within normal limits. No mass or hydronephrosis visualized. Left Kidney: Length: 9.3 cm. Echogenicity within normal limits. No mass or hydronephrosis visualized. Abdominal aorta: No aneurysm visualized. Other findings: 2 rounded hypoechoic solid abnormalities are seen adjacent to the lower pole of left kidney, the largest measuring 1.7 cm. IMPRESSION: Two rounded solid abnormality seen adjacent to the lower pole of left kidney, the largest measuring 1.7 cm in diameter. CT scan is recommended for further evaluation. Electronically Signed   By: Marijo Conception M.D.   On: 06/09/2020 12:28   DG Chest Port 1 View  Result Date: 05/23/2020 CLINICAL DATA:  Status post left thoracentesis EXAM: PORTABLE CHEST 1 VIEW COMPARISON:  Low-dose lung cancer screening CT chest dated 04/20/2020 FINDINGS: Small left pleural effusion, partially loculated. Mild left lower lobe opacity, likely atelectasis. No frank interstitial edema. Right lung is clear. No pneumothorax is seen status post thoracentesis. The heart is normal in size. Bilateral breast augmentation. IMPRESSION: No pneumothorax is seen status post thoracentesis. Small left pleural effusion, partially loculated. Mild left lower lobe opacity, likely atelectasis. Electronically Signed   By: Julian Hy M.D.   On: 05/23/2020 11:16   ECHOCARDIOGRAM COMPLETE  Result Date: 06/02/2020    ECHOCARDIOGRAM REPORT   Patient Name:   MIANI ALBACH Date of Exam: 06/02/2020 Medical Rec #:  [id number]      Height:       67.0 in Accession #:     [id number]     Weight:       93.4 lb Date of Birth:  [birth year]       BSA:          1.463 m Patient Age:    78 years       BP:  105/58 mmHg Patient Gender: F              HR:           80 bpm. Exam Location:  Outpatient Procedure: 2D Echo Indications:    abnormal ecg  History:        Patient has no prior history of Echocardiogram examinations.  Sonographer:    Johny Chess RDCS Referring Phys: [place]  1. Left ventricular ejection fraction, by estimation, is 60 to 65%. The left ventricle has normal function. The left ventricle has no regional wall motion abnormalities. Left ventricular diastolic parameters are consistent with Grade I diastolic dysfunction (impaired relaxation).  2. Right ventricular systolic function is normal. The right ventricular size is normal. There is normal pulmonary artery systolic pressure.  3. The mitral valve is grossly normal. Trivial mitral valve regurgitation.  4. The aortic valve is tricuspid. There is mild calcification of the aortic valve. There is mild thickening of the aortic valve. Aortic valve regurgitation is not visualized. Mild aortic valve sclerosis is present, with no evidence of aortic valve stenosis.  5. The inferior vena cava is normal in size with <50% respiratory variability, suggesting right atrial pressure of 8 mmHg. Comparison(s): No prior Echocardiogram. FINDINGS  Left Ventricle: Left ventricular ejection fraction, by estimation, is 60 to 65%. The left ventricle has normal function. The left ventricle has no regional wall motion abnormalities. The left ventricular internal cavity size was normal in size. There is  no left ventricular hypertrophy. Left ventricular diastolic parameters are consistent with Grade I diastolic dysfunction (impaired relaxation). Right Ventricle: The right ventricular size is normal. Right vetricular wall thickness was not assessed. Right ventricular systolic function is normal. There is normal  pulmonary artery systolic pressure. The tricuspid regurgitant velocity is 2.36 m/s, and with an assumed right atrial pressure of 3 mmHg, the estimated right ventricular systolic pressure is 99991111 mmHg. Left Atrium: Left atrial size was normal in size. Right Atrium: Right atrial size was normal in size. Pericardium: There is no evidence of pericardial effusion. Mitral Valve: The mitral valve is grossly normal. There is mild thickening of the mitral valve leaflet(s). There is mild calcification of the mitral valve leaflet(s). Mild mitral annular calcification. Trivial mitral valve regurgitation. Tricuspid Valve: The tricuspid valve is normal in structure. Tricuspid valve regurgitation is mild. Aortic Valve: The aortic valve is tricuspid. There is mild calcification of the aortic valve. There is mild thickening of the aortic valve. Aortic valve regurgitation is not visualized. Mild aortic valve sclerosis is present, with no evidence of aortic valve stenosis. Pulmonic Valve: The pulmonic valve was normal in structure. Pulmonic valve regurgitation is trivial. Aorta: The aortic root and ascending aorta are structurally normal, with no evidence of dilitation. Venous: The inferior vena cava is normal in size with less than 50% respiratory variability, suggesting right atrial pressure of 8 mmHg. IAS/Shunts: No atrial level shunt detected by color flow Doppler.  LEFT VENTRICLE PLAX 2D LVIDd:         3.50 cm  Diastology LVIDs:         2.20 cm  LV e' medial:    12.50 cm/s LV PW:         0.90 cm  LV E/e' medial:  5.1 LV IVS:        0.70 cm  LV e' lateral:   15.90 cm/s LVOT diam:     1.80 cm  LV E/e' lateral: 4.0 LV SV:  56 LV SV Index:   39 LVOT Area:     2.54 cm  RIGHT VENTRICLE             IVC RV S prime:     12.90 cm/s  IVC diam: 1.70 cm TAPSE (M-mode): 1.3 cm LEFT ATRIUM             Index       RIGHT ATRIUM           Index LA diam:        3.10 cm 2.12 cm/m  RA Area:     11.60 cm LA Vol (A2C):   14.6 ml 9.98 ml/m   RA Volume:   26.10 ml  17.84 ml/m LA Vol (A4C):   25.8 ml 17.63 ml/m LA Biplane Vol: 20.1 ml 13.74 ml/m  AORTIC VALVE LVOT Vmax:   116.00 cm/s LVOT Vmean:  72.700 cm/s LVOT VTI:    0.222 m  AORTA Ao Root diam: 2.60 cm Ao Asc diam:  3.30 cm MITRAL VALVE               TRICUSPID VALVE MV Area (PHT): 4.31 cm    TR Peak grad:   22.3 mmHg MV Decel Time: 176 msec    TR Vmax:        236.00 cm/s MV E velocity: 63.80 cm/s MV A velocity: 70.70 cm/s  SHUNTS MV E/A ratio:  0.90        Systemic VTI:  0.22 m                            Systemic Diam: 1.80 cm Gwyndolyn Kaufman MD Electronically signed by Gwyndolyn Kaufman MD Signature Date/Time: 06/02/2020/2:23:31 PM    Final    US THORACENTESIS ASP PLEURAL SPACE W/IMG GUIDE  Result Date: 05/23/2020 INDICATION: Patient with history of smoking found to have a small pleural effusion on CT Chest Lung Cancer Screening. Request for diagnostic left-sided thoracentesis. EXAM: ULTRASOUND GUIDED DIAGNOSTIC LEFT-SIDED THORACENTESIS MEDICATIONS: Lidocaine 1% 10 mL COMPLICATIONS: None immediate. PROCEDURE: An ultrasound guided thoracentesis was thoroughly discussed with the patient and questions answered. The benefits, risks, alternatives and complications were also discussed. The patient understands and wishes to proceed with the procedure. Written consent was obtained. Ultrasound was performed to localize and mark an adequate pocket of fluid in the left chest. The area was then prepped and draped in the normal sterile fashion. 1% Lidocaine was used for local anesthesia. Under ultrasound guidance a 6 Fr Safe-T-Centesis catheter was introduced. Thoracentesis was performed. The catheter was removed and a dressing applied. FINDINGS: A total of approximately 200 mL of amber colored fluid was removed. Samples were sent to the laboratory as requested by the clinical team. Patient reporting chest pressure and unable to remove additional fluid at this time. IMPRESSION: Successful ultrasound  guided left diagnostic thoracentesis yielding 200 mL of pleural fluid. Read by: Rushie Nyhan, NP Electronically Signed   By: Markus Daft M.D.   On: 05/23/2020 11:25      No flowsheet data found.  No results found for: NITRICOXIDE      Assessment & Plan:   Tobacco use disorder Smoking cessation was discussed.  Weight loss Gradual weight loss questionable etiology. Alpha-1 testing was normal. Patient's abdominal ultrasound does show abnormality along the left kidney. Will need further evaluation. Patient does have a follow-up with her primary care later this month have also advised her to follow-up with them for her  ongoing weight loss. Have suggested protein supplements such as Ensure or boost in between meals. We will set up for a CT abdomen with and without contrast to evaluate abnormality along the kidney.  Pleural effusion, left Small left pleural effusion status post thoracentesis. Cytology was negative for malignant cells. Appears transudate of by lights criteria. 2D echo showed no evidence of congestive heart failure. Mild diastolic dysfunction. Ultrasound of the abdomen showed no evidence of liver disease. Incidental kidney abnormality noted. Follow-up scans will be completed. Chest x-ray today. Patient has known emphysema and active smoker. Will check PFTs.  COPD mixed type (HCC) COPD with emphysema ongoing smoking. Patient is encouraged on smoking cessation. Continue on Spiriva. Check PFTs on return     Rubye Oaks, NP 06/14/2020

## 2020-06-14 NOTE — Assessment & Plan Note (Signed)
COPD with emphysema ongoing smoking. Patient is encouraged on smoking cessation. Continue on Spiriva. Check PFTs on return

## 2020-06-14 NOTE — Patient Instructions (Addendum)
Set up CT abdomen , follow up with Primary MD going forward.  Continue on Spiriva 2 puffs daily .  Activity as tolerated.  Chest xray today   High protein, consider ensure/boost between meals .  Follow up in 2 months with Dr. Marchelle Gearing with PFT at [facility] office and As needed

## 2020-06-14 NOTE — Assessment & Plan Note (Signed)
Gradual weight loss questionable etiology. Alpha-1 testing was normal. Patient's abdominal ultrasound does show abnormality along the left kidney. Will need further evaluation. Patient does have a follow-up with her primary care later this month have also advised her to follow-up with them for her ongoing weight loss. Have suggested protein supplements such as Ensure or boost in between meals. We will set up for a CT abdomen with and without contrast to evaluate abnormality along the kidney.

## 2020-06-15 MED ORDER — FUROSEMIDE 20 MG PO TABS: 20.0000 mg | ORAL_TABLET | Freq: Every day | ORAL | 0 refills | Status: DC | PRN

## 2020-06-15 NOTE — Addendum Note (Signed)
Addended by: Julio Sicks on: 06/15/2020 12:05 PM   Modules accepted: Orders

## 2020-06-16 DIAGNOSIS — F17210 Nicotine dependence, cigarettes, uncomplicated: Secondary | ICD-10-CM | POA: Diagnosis not present

## 2020-06-16 DIAGNOSIS — F332 Major depressive disorder, recurrent severe without psychotic features: Secondary | ICD-10-CM | POA: Diagnosis not present

## 2020-06-16 DIAGNOSIS — F418 Other specified anxiety disorders: Secondary | ICD-10-CM | POA: Diagnosis not present

## 2020-06-16 DIAGNOSIS — Z79899 Other long term (current) drug therapy: Secondary | ICD-10-CM | POA: Diagnosis not present

## 2020-06-17 ENCOUNTER — Telehealth: Payer: Self-pay

## 2020-06-17 NOTE — Telephone Encounter (Signed)
Patient is aware of date/time of covid test prior to PFT.  

## 2020-06-21 ENCOUNTER — Other Ambulatory Visit: Payer: Self-pay

## 2020-06-21 ENCOUNTER — Other Ambulatory Visit
Admission: RE | Admit: 2020-06-21 | Discharge: 2020-06-21 | Disposition: A | Discharge status: Home / Self Care | Payer: Medicare Other | Source: Ambulatory Visit | Attending: Adult Health | Admitting: Adult Health

## 2020-06-21 DIAGNOSIS — Z01812 Encounter for preprocedural laboratory examination: Secondary | ICD-10-CM | POA: Insufficient documentation

## 2020-06-21 DIAGNOSIS — Z20822 Contact with and (suspected) exposure to covid-19: Secondary | ICD-10-CM | POA: Diagnosis not present

## 2020-06-22 ENCOUNTER — Ambulatory Visit: Payer: Medicare Other | Attending: Adult Health

## 2020-06-22 ENCOUNTER — Other Ambulatory Visit: Payer: Self-pay

## 2020-06-22 DIAGNOSIS — J439 Emphysema, unspecified: Secondary | ICD-10-CM

## 2020-06-22 LAB — SARS CORONAVIRUS 2 (TAT 6-24 HRS): SARS Coronavirus 2: NEGATIVE

## 2020-06-22 MED ORDER — ALBUTEROL SULFATE (2.5 MG/3ML) 0.083% IN NEBU
2.5000 mg | INHALATION_SOLUTION | Freq: Once | RESPIRATORY_TRACT | Status: AC
  Administered 2020-06-22: 2.5 mg via RESPIRATORY_TRACT
  Filled 2020-06-22: qty 3

## 2020-06-23 LAB — PULMONARY FUNCTION TEST ARMC ONLY
DL/VA % pred: 62 %
DL/VA: 2.53 ml/min/mmHg/L
DLCO unc % pred: 35 %
DLCO unc: 7.7 ml/min/mmHg
FEF 25-75 Post: 2.24 L/sec
FEF 25-75 Pre: 2.62 L/sec
FEF2575-%Change-Post: -14 %
FEF2575-%Pred-Post: 103 %
FEF2575-%Pred-Pre: 120 %
FEV1-%Change-Post: -3 %
FEV1-%Pred-Post: 46 %
FEV1-%Pred-Pre: 48 %
FEV1-Post: 1.22 L
FEV1-Pre: 1.27 L
FEV1FVC-%Change-Post: -4 %
FEV1FVC-%Pred-Pre: 130 %
FEV6-%Change-Post: 0 %
FEV6-%Pred-Post: 38 %
FEV6-%Pred-Pre: 38 %
FEV6-Post: 1.28 L
FEV6-Pre: 1.28 L
FEV6FVC-%Pred-Post: 104 %
FEV6FVC-%Pred-Pre: 104 %
FVC-%Change-Post: 0 %
FVC-%Pred-Post: 37 %
FVC-%Pred-Pre: 37 %
FVC-Post: 1.28 L
Post FEV1/FVC ratio: 95 %
Post FEV6/FVC ratio: 100 %
Pre FEV1/FVC ratio: 99 %
Pre FEV6/FVC Ratio: 100 %
RV % pred: 89 %
RV: 2.07 L
TLC % pred: 66 %
TLC: 3.68 L

## 2020-06-24 ENCOUNTER — Encounter: Payer: Self-pay | Admitting: Adult Health

## 2020-06-24 ENCOUNTER — Other Ambulatory Visit: Payer: Self-pay

## 2020-06-24 ENCOUNTER — Ambulatory Visit (INDEPENDENT_AMBULATORY_CARE_PROVIDER_SITE_OTHER): Payer: Medicare Other | Admitting: Adult Health

## 2020-06-24 ENCOUNTER — Ambulatory Visit
Admission: RE | Admit: 2020-06-24 | Discharge: 2020-06-24 | Disposition: A | Discharge status: Home / Self Care | Payer: Medicare Other | Source: Ambulatory Visit | Attending: Adult Health | Admitting: Adult Health

## 2020-06-24 VITALS — BP 124/78 | HR 71 | Temp 97.8°F | Ht 67.0 in | Wt 93.0 lb

## 2020-06-24 DIAGNOSIS — J90 Pleural effusion, not elsewhere classified: Secondary | ICD-10-CM | POA: Diagnosis not present

## 2020-06-24 DIAGNOSIS — J449 Chronic obstructive pulmonary disease, unspecified: Secondary | ICD-10-CM

## 2020-06-24 DIAGNOSIS — N2889 Other specified disorders of kidney and ureter: Secondary | ICD-10-CM | POA: Diagnosis not present

## 2020-06-24 DIAGNOSIS — R634 Abnormal weight loss: Secondary | ICD-10-CM

## 2020-06-24 DIAGNOSIS — F17200 Nicotine dependence, unspecified, uncomplicated: Secondary | ICD-10-CM | POA: Diagnosis not present

## 2020-06-24 DIAGNOSIS — K449 Diaphragmatic hernia without obstruction or gangrene: Secondary | ICD-10-CM | POA: Diagnosis not present

## 2020-06-24 DIAGNOSIS — D7389 Other diseases of spleen: Secondary | ICD-10-CM | POA: Diagnosis not present

## 2020-06-24 DIAGNOSIS — K760 Fatty (change of) liver, not elsewhere classified: Secondary | ICD-10-CM | POA: Diagnosis not present

## 2020-06-24 LAB — POCT I-STAT CREATININE: Creatinine, Ser: 0.8 mg/dL (ref 0.44–1.00)

## 2020-06-24 MED ORDER — IOHEXOL 300 MG/ML  SOLN
75.0000 mL | Freq: Once | INTRAMUSCULAR | Status: AC | PRN
  Administered 2020-06-24: 75 mL via INTRAVENOUS

## 2020-06-24 NOTE — Patient Instructions (Addendum)
Follow up with Primary MD next week as planned, discuss recent CT Abdomen results and weight loss .  Continue on Spiriva 2 puffs daily .  Activity as tolerated.  Lasix 20mg  daily as needed for fluid retention .  High protein, consider ensure/boost between meals .  Follow up in 3  months with Dr. Chase Caller with chest xray  at [facility] office and As needed

## 2020-06-24 NOTE — Progress Notes (Signed)
@Patient  ID: Carolyn Dunlap, female    DOB: February 02, 1951, 70 y.o.   MRN: [id number]  Chief Complaint  Patient presents with  . Follow-up    Referring provider: Glean Hess, MD  HPI: 70 year old female seen for pulmonary consult May 20, 2020 for left small pleural effusion  TEST/EVENTS :  Low-dose CT chest April 20, 2020 with lung RADS 1, small left pleural effusion Moderate emphysema, previously described left lower lobe pulmonary nodule not readily apparent.  Smooth septal thickening in the right lung base is similar.  Postinfectious/inflammatory right lung base volume loss and scarring.  Biapical pleural-parenchymal scarring.  No suspicious pulmonary nodules or masses  06/24/2020 Follow up : Left pleural effusion, emphysema Patient returns for a 2-week follow-up.  Patient was seen last month for a pulmonary consult.  She participates in the low-dose CT chest screening program.  CT was done April 20, 2020 that showed a left small pleural effusion.  She underwent a left thoracentesis via interventional radiology.  This was completed May 23, 2020 with 200 cc of amber-colored fluid with removal.  This was transudate of by lights criteria.  Cytology was negative for malignant cells.  Lab work showed autoimmune and connective tissue labs were negative.  2D echo showed a preserved EF at 60 to 78%, grade 1 diastolic dysfunction.  No significant valvular disease was noted.  Patient was set up for an abdominal ultrasound that was essentially unremarkable except to rounded solid abnormality seen adjacent to the lower pole of the left kidney.  Largest measuring 1.7 cm.  Patient had a subsequent CT abdomen to further evaluate.This was completed this morning that showed mild hepatic steatosis, spleen 2 splenules adjacent to the left kidney maximum 1.5 cm.  And a left-sided chronic small pleural effusion with bibasilar atelectasis and septal thickening we discussed her CT results from this  morning.  She has a follow-up appointment with her primary care provider next week we will send these results.   Last visit patient was started on Spiriva.  Patient says overall breathing is doing okay.  She does get short of breath with heavy activities.  She does continue to smoke.  We discussed smoking cessation.  Patient had pulmonary function testing that was done June 22, 2020 that showed severe restriction with an FEV1 at 48%, ratio 99, FVC 37%.  No significant bronchodilator response.  Total lung capacity 66%.  DLCO 35%. Patient was felt to have possible component of some diastolic dysfunction.  She has some lower extremity swelling she was started on Lasix 20 mg daily for 1 week.  Patient says this did help.  Her lower extremity swelling has resolved.  She is also had some ongoing weight loss over the last year.  We discussed a high-protein diet and adding in a protein supplement between meals. Patient denies any hemoptysis chest pain orthopnea PND or increased leg swelling.  Allergies  Allergen Reactions  . Zoloft [Sertraline Hcl]     diaphoresis  . Adhesive [Tape] Rash    Band-aids  . Penicillins Rash    Immunization History  Administered Date(s) Administered  . Influenza,inj,quad, With Preservative 04/04/2018  . Influenza-Unspecified 04/08/2017, 03/12/2019, 03/22/2020  . PFIZER SARS-COV-2 Vaccination 07/29/2019, 08/19/2019, 03/11/2020  . Pneumococcal Conjugate-13 05/26/2015  . Pneumococcal Polysaccharide-23 05/29/2016  . Zoster 02/09/2011  . Zoster Recombinat (Shingrix) 06/25/2017, 06/10/2018    Past Medical History:  Diagnosis Date  . Depression   . GERD (gastroesophageal reflux disease)   . Insomnia   .  Migraine headache    weekly  . PONV (postoperative nausea and vomiting)    in distant past    Tobacco History: Social History   Tobacco Use  Smoking Status Current Every Day Smoker  . Packs/day: 1.00  . Years: 30.00  . Pack years: 30.00  . Types: Cigarettes   Smokeless Tobacco Never Used  Tobacco Comment   0.5PPD 06/24/2020   Ready to quit: Not Answered Counseling given: Not Answered Comment: 0.5PPD 06/24/2020   Outpatient Medications Prior to Visit  Medication Sig Dispense Refill  . albuterol (VENTOLIN HFA) 108 (90 Base) MCG/ACT inhaler Inhale 2 puffs into the lungs every 6 (six) hours as needed for wheezing or shortness of breath. 8 g 2  . ARIPiprazole (ABILIFY) 5 MG tablet Take 5 mg by mouth daily.    Marland Kitchen buPROPion (WELLBUTRIN XL) 150 MG 24 hr tablet Take 1 tablet (150 mg total) by mouth daily. (Patient taking differently: Take 300 mg by mouth daily.) 30 tablet 1  . Calcium-Magnesium-Vitamin D (CALCIUM 500 PO) Take by mouth.    . cyclobenzaprine (FLEXERIL) 10 MG tablet Take 1 tablet (10 mg total) by mouth at bedtime. 30 tablet 1  . cyproheptadine (PERIACTIN) 4 MG tablet Take 1 tablet by mouth in the morning, at noon, in the evening, and at bedtime.    . famotidine (PEPCID) 20 MG tablet Take 1 tablet (20 mg total) by mouth 2 (two) times daily. (Patient taking differently: Take 20 mg by mouth 2 (two) times daily. PRN only) 60 tablet 1  . furosemide (LASIX) 20 MG tablet Take 1 tablet (20 mg total) by mouth daily as needed. 30 tablet 0  . Multiple Vitamin (MULTI-DAY PO) Take by mouth.    . SUMAtriptan (IMITREX) 100 MG tablet TAKE 1 TABLET DAILY AS     DIRECTED BY YOUR DOCTOR 27 tablet 3  . Tiotropium Bromide Monohydrate (SPIRIVA RESPIMAT) 1.25 MCG/ACT AERS Inhale 2 puffs into the lungs daily. 4 g 11  . traZODone (DESYREL) 100 MG tablet     . zolpidem (AMBIEN) 10 MG tablet TAKE 1 TABLET AT BEDTIME. (Patient taking differently: Prescribed by Dr. Hassan Buckler) 90 tablet 1   No facility-administered medications prior to visit.     Review of Systems:   Constitutional:   No  weight loss, night sweats,  Fevers, chills, + fatigue, or  lassitude.  HEENT:   No headaches,  Difficulty swallowing,  Tooth/dental problems, or  Sore throat,                No  sneezing, itching, ear ache, nasal congestion, post nasal drip,   CV:  No chest pain,  Orthopnea, PND, swelling in lower extremities, anasarca, dizziness, palpitations, syncope.   GI  No heartburn, indigestion, abdominal pain, nausea, vomiting, diarrhea, change in bowel habits, loss of appetite, bloody stools.   Resp:.  No chest wall deformity  Skin: no rash or lesions.  GU: no dysuria, change in color of urine, no urgency or frequency.  No flank pain, no hematuria   MS:  No joint pain or swelling.  No decreased range of motion.  No back pain.    Physical Exam  BP 124/78 (BP Location: Left Arm, Cuff Size: Normal)   Pulse 71   Temp 97.8 F (36.6 C) (Temporal)   Ht 5\' 7"  (1.702 m)   Wt 93 lb (42.2 kg)   SpO2 96%   BMI 14.57 kg/m   GEN: A/Ox3; pleasant , NAD, thin and elderly   HEENT:  [place]/AT,  EACs-clear, TMs-wnl, NOSE-clear, THROAT-clear, no lesions, no postnasal drip or exudate noted.   NECK:  Supple w/ fair ROM; no JVD; normal carotid impulses w/o bruits; no thyromegaly or nodules palpated; no lymphadenopathy.    RESP  Clear  P & A; w/o, wheezes/ rales/ or rhonchi. no accessory muscle use, no dullness to percussion  CARD:  RRR, no m/r/g, no peripheral edema, pulses intact, no cyanosis or clubbing.  GI:   Soft & nt; nml bowel sounds; no organomegaly or masses detected.   Musco: Warm bil, no deformities or joint swelling noted.   Neuro: alert, no focal deficits noted.    Skin: Warm, no lesions or rashes    Lab Results:  CBC  BMET  BNP No results found for: BNP  ProBNP No results found for: PROBNP  Imaging: DG Chest 2 View  Result Date: 06/14/2020 CLINICAL DATA:  Pleural effusion EXAM: CHEST - 2 VIEW COMPARISON:  05/23/2020, CT 04/20/2020 FINDINGS: The lungs are symmetrically hyperinflated in keeping with changes of underlying COPD. Small to moderate partially posterolaterally loculated left pleural effusion appears stable. No pneumothorax. No pleural  effusion on the right. Cardiac size within normal limits. The central pulmonary vasculature appears engorged, in keeping with mild volume overload or early cardiogenic failure, without overt pulmonary edema. No acute bone abnormality. Calcified bilateral breast implants are noted. IMPRESSION: Stable small left partially loculated pleural effusion. Central pulmonary vascular congestion in keeping with early cardiogenic failure or volume overload. COPD. Electronically Signed   By: Fidela Salisbury MD   On: 06/14/2020 22:57   US Abdomen Complete  Result Date: 06/09/2020 CLINICAL DATA:  Protein losing enteropathy. EXAM: ABDOMEN ULTRASOUND COMPLETE COMPARISON:  None. FINDINGS: Gallbladder: No gallstones or wall thickening visualized. No sonographic Murphy sign noted by sonographer. Common bile duct: Diameter: 2 mm which is within normal limits. Liver: No focal lesion identified. Within normal limits in parenchymal echogenicity. Portal vein is patent on color Doppler imaging with normal direction of blood flow towards the liver. IVC: No abnormality visualized. Pancreas: Visualized portion unremarkable. Spleen: Size and appearance within normal limits. Right Kidney: Length: 10.7 cm. Echogenicity within normal limits. No mass or hydronephrosis visualized. Left Kidney: Length: 9.3 cm. Echogenicity within normal limits. No mass or hydronephrosis visualized. Abdominal aorta: No aneurysm visualized. Other findings: 2 rounded hypoechoic solid abnormalities are seen adjacent to the lower pole of left kidney, the largest measuring 1.7 cm. IMPRESSION: Two rounded solid abnormality seen adjacent to the lower pole of left kidney, the largest measuring 1.7 cm in diameter. CT scan is recommended for further evaluation. Electronically Signed   By: Marijo Conception M.D.   On: 06/09/2020 12:28   CT ABDOMEN W WO CONTRAST  Result Date: 06/24/2020 CLINICAL DATA:  Ultrasound demonstrating solid lesions adjacent the lower pole of left  kidney. EXAM: CT ABDOMEN WITHOUT AND WITH CONTRAST TECHNIQUE: Multidetector CT imaging of the abdomen was performed following the standard protocol before and following the bolus administration of intravenous contrast. CONTRAST:  52mL OMNIPAQUE IOHEXOL 300 MG/ML  SOLN COMPARISON:  Ultrasound 06/09/2020.  Chest radiograph of 06/14/2020. FINDINGS: Lower chest: Septal thickening at the lung bases. Left greater than right base dependent atelectasis. Mild cardiomegaly. Small left pleural effusion with pleural thickening indicative of chronicity. Bilateral calcified breast implants. Hepatobiliary: Mild hepatic steatosis. The inferior right hepatic lobe is partially excluded. Normal liver. Normal gallbladder, without biliary ductal dilatation. Pancreas: Normal, without mass or ductal dilatation. Spleen: 2 splenules identified adjacent the left kidney, accounting for  the ultrasound abnormality. Maximally 1.5 cm. Adrenals/Urinary Tract: Normal adrenal glands. No suspicious renal mass. A too small to characterize lower pole right renal lesion is likely a cyst at 2 mm. Normal left kidney. Stomach/Bowel: Tiny hiatal hernia.  Normal abdominal bowel loops. Vascular/Lymphatic: Aortic atherosclerosis. No retroperitoneal or retrocrural adenopathy. Other: No abdominal ascites. Musculoskeletal: No acute osseous abnormality. IMPRESSION: 1. The ultrasound abnormality was secondary to splenules. 2.  No acute abdominal process. 3. Left-sided chronic small pleural effusion. Bibasilar atelectasis and septal thickening, possibly representing mild pulmonary edema. 4.  Aortic Atherosclerosis (ICD10-I70.0). 5. Hepatic steatosis. Electronically Signed   By: Jeronimo GreavesKyle  Talbot M.D.   On: 06/24/2020 08:41   ECHOCARDIOGRAM COMPLETE  Result Date: 06/02/2020    ECHOCARDIOGRAM REPORT   Patient Name:   Carolyn Dunlap Date of Exam: 06/02/2020 Medical Rec #:  [id number]      Height:       67.0 in Accession #:    [id number]     Weight:       93.4 lb Date of  Birth:  12/08/1950       BSA:          1.463 m Patient Age:    69 years       BP:           105/58 mmHg Patient Gender: F              HR:           80 bpm. Exam Location:  Outpatient Procedure: 2D Echo Indications:    abnormal ecg  History:        Patient has no prior history of Echocardiogram examinations.  Sonographer:    Delcie RochLauren Pennington RDCS Referring Phys: [id number] MURALI RAMASWAMY IMPRESSIONS  1. Left ventricular ejection fraction, by estimation, is 60 to 65%. The left ventricle has normal function. The left ventricle has no regional wall motion abnormalities. Left ventricular diastolic parameters are consistent with Grade I diastolic dysfunction (impaired relaxation).  2. Right ventricular systolic function is normal. The right ventricular size is normal. There is normal pulmonary artery systolic pressure.  3. The mitral valve is grossly normal. Trivial mitral valve regurgitation.  4. The aortic valve is tricuspid. There is mild calcification of the aortic valve. There is mild thickening of the aortic valve. Aortic valve regurgitation is not visualized. Mild aortic valve sclerosis is present, with no evidence of aortic valve stenosis.  5. The inferior vena cava is normal in size with <50% respiratory variability, suggesting right atrial pressure of 8 mmHg. Comparison(s): No prior Echocardiogram. FINDINGS  Left Ventricle: Left ventricular ejection fraction, by estimation, is 60 to 65%. The left ventricle has normal function. The left ventricle has no regional wall motion abnormalities. The left ventricular internal cavity size was normal in size. There is  no left ventricular hypertrophy. Left ventricular diastolic parameters are consistent with Grade I diastolic dysfunction (impaired relaxation). Right Ventricle: The right ventricular size is normal. Right vetricular wall thickness was not assessed. Right ventricular systolic function is normal. There is normal pulmonary artery systolic pressure. The tricuspid  regurgitant velocity is 2.36 m/s, and with an assumed right atrial pressure of 3 mmHg, the estimated right ventricular systolic pressure is 25.3 mmHg. Left Atrium: Left atrial size was normal in size. Right Atrium: Right atrial size was normal in size. Pericardium: There is no evidence of pericardial effusion. Mitral Valve: The mitral valve is grossly normal. There is mild thickening of the mitral valve leaflet(s). There is  mild calcification of the mitral valve leaflet(s). Mild mitral annular calcification. Trivial mitral valve regurgitation. Tricuspid Valve: The tricuspid valve is normal in structure. Tricuspid valve regurgitation is mild. Aortic Valve: The aortic valve is tricuspid. There is mild calcification of the aortic valve. There is mild thickening of the aortic valve. Aortic valve regurgitation is not visualized. Mild aortic valve sclerosis is present, with no evidence of aortic valve stenosis. Pulmonic Valve: The pulmonic valve was normal in structure. Pulmonic valve regurgitation is trivial. Aorta: The aortic root and ascending aorta are structurally normal, with no evidence of dilitation. Venous: The inferior vena cava is normal in size with less than 50% respiratory variability, suggesting right atrial pressure of 8 mmHg. IAS/Shunts: No atrial level shunt detected by color flow Doppler.  LEFT VENTRICLE PLAX 2D LVIDd:         3.50 cm  Diastology LVIDs:         2.20 cm  LV e' medial:    12.50 cm/s LV PW:         0.90 cm  LV E/e' medial:  5.1 LV IVS:        0.70 cm  LV e' lateral:   15.90 cm/s LVOT diam:     1.80 cm  LV E/e' lateral: 4.0 LV SV:         56 LV SV Index:   39 LVOT Area:     2.54 cm  RIGHT VENTRICLE             IVC RV S prime:     12.90 cm/s  IVC diam: 1.70 cm TAPSE (M-mode): 1.3 cm LEFT ATRIUM             Index       RIGHT ATRIUM           Index LA diam:        3.10 cm 2.12 cm/m  RA Area:     11.60 cm LA Vol (A2C):   14.6 ml 9.98 ml/m  RA Volume:   26.10 ml  17.84 ml/m LA Vol (A4C):    25.8 ml 17.63 ml/m LA Biplane Vol: 20.1 ml 13.74 ml/m  AORTIC VALVE LVOT Vmax:   116.00 cm/s LVOT Vmean:  72.700 cm/s LVOT VTI:    0.222 m  AORTA Ao Root diam: 2.60 cm Ao Asc diam:  3.30 cm MITRAL VALVE               TRICUSPID VALVE MV Area (PHT): 4.31 cm    TR Peak grad:   22.3 mmHg MV Decel Time: 176 msec    TR Vmax:        236.00 cm/s MV E velocity: 63.80 cm/s MV A velocity: 70.70 cm/s  SHUNTS MV E/A ratio:  0.90        Systemic VTI:  0.22 m                            Systemic Diam: 1.80 cm Gwyndolyn Kaufman MD Electronically signed by Gwyndolyn Kaufman MD Signature Date/Time: 06/02/2020/2:23:31 PM    Final    Pulmonary Function Test [facility] Only  Result Date: 06/23/2020 Spirometry Data Is Acceptable and Reproducible Moderate/Severe RESTRICTIVE LUNG DISEASE without Significant Broncho-Dilator Response Consider outpatient Pulmonary Consultation if needed Clinical Correlation Advised    albuterol (PROVENTIL) (2.5 MG/3ML) 0.083% nebulizer solution 2.5 mg    Date Action Dose Route User   06/22/2020 1440 Given 2.5 mg Nebulization Dayton Martes,  RT      PFT Results Latest Ref Rng & Units 06/22/2020  FVC-Predicted Pre % 37  FVC-Post L 1.28  FVC-Predicted Post % 37  Pre FEV1/FVC % % 99  Post FEV1/FCV % % 95  FEV1-Pre L 1.27  FEV1-Predicted Pre % 48  FEV1-Post L 1.22  DLCO uncorrected ml/min/mmHg 7.70  DLCO [facility]% % 35  DLVA Predicted % 62  TLC L 3.68  TLC % Predicted % 66  RV % Predicted % 89    No results found for: NITRICOXIDE      Assessment & Plan:   COPD mixed type (HCC) PFTs show mixed disease.  Patient has severe restriction most likely secondary to emphysema.  Patient is encouraged on smoking cessation. She is to continue on Spiriva.  Activity as tolerated.  Plan  Patient Instructions  Follow up with Primary MD next week as planned, discuss recent CT Abdomen results and weight loss .  Continue on Spiriva 2 puffs daily .  Activity as tolerated.  Lasix 20mg  daily as  needed for fluid retention .  High protein, consider ensure/boost between meals .  Follow up in 3  months with Dr. Chase Caller with chest xray  at [facility] office and As needed      -  Pleural effusion, left Left pleural effusion status postthoracentesis.  Cytology was negative.  Appeared to be transudative.   Trial of Lasix x1 week.  CT abdomen this morning showed persistent small left pleural effusion. Patient is only to use Lasix as needed. We will continue to follow.  Repeat chest x-ray in 3 months.  Plan  Patient Instructions  Follow up with Primary MD next week as planned, discuss recent CT Abdomen results and weight loss .  Continue on Spiriva 2 puffs daily .  Activity as tolerated.  Lasix 20mg  daily as needed for fluid retention .  High protein, consider ensure/boost between meals .  Follow up in 3  months with Dr. Chase Caller with chest xray  at [facility] office and As needed         Tobacco use disorder Smoking cessation discussed  Weight loss Ongoing weight loss questionable etiology.  Patient to follow-up with primary care provider next week     Rexene Edison, NP 06/24/2020

## 2020-06-24 NOTE — Assessment & Plan Note (Signed)
Ongoing weight loss questionable etiology.  Patient to follow-up with primary care provider next week

## 2020-06-24 NOTE — Assessment & Plan Note (Signed)
Left pleural effusion status postthoracentesis.  Cytology was negative.  Appeared to be transudative.   Trial of Lasix x1 week.  CT abdomen this morning showed persistent small left pleural effusion. Patient is only to use Lasix as needed. We will continue to follow.  Repeat chest x-ray in 3 months.  Plan  Patient Instructions  Follow up with Primary MD next week as planned, discuss recent CT Abdomen results and weight loss .  Continue on Spiriva 2 puffs daily .  Activity as tolerated.  Lasix 20mg  daily as needed for fluid retention .  High protein, consider ensure/boost between meals .  Follow up in 3  months with Dr. Chase Caller with chest xray  at [facility] office and As needed

## 2020-06-24 NOTE — Assessment & Plan Note (Signed)
PFTs show mixed disease.  Patient has severe restriction most likely secondary to emphysema.  Patient is encouraged on smoking cessation. She is to continue on Spiriva.  Activity as tolerated.  Plan  Patient Instructions  Follow up with Primary MD next week as planned, discuss recent CT Abdomen results and weight loss .  Continue on Spiriva 2 puffs daily .  Activity as tolerated.  Lasix 20mg  daily as needed for fluid retention .  High protein, consider ensure/boost between meals .  Follow up in 3  months with Dr. Chase Caller with chest xray  at [facility] office and As needed      -

## 2020-06-24 NOTE — Assessment & Plan Note (Signed)
Smoking cessation discussed 

## 2020-07-01 ENCOUNTER — Encounter: Payer: Medicare Other | Admitting: Internal Medicine

## 2020-08-03 ENCOUNTER — Encounter: Payer: Self-pay | Admitting: Internal Medicine

## 2020-08-03 ENCOUNTER — Ambulatory Visit (INDEPENDENT_AMBULATORY_CARE_PROVIDER_SITE_OTHER): Payer: Medicare Other | Admitting: Internal Medicine

## 2020-08-03 ENCOUNTER — Other Ambulatory Visit: Payer: Self-pay

## 2020-08-03 VITALS — BP 86/64 | HR 71 | Temp 98.1°F | Ht 67.0 in | Wt 90.0 lb

## 2020-08-03 DIAGNOSIS — M79674 Pain in right toe(s): Secondary | ICD-10-CM

## 2020-08-03 NOTE — Progress Notes (Signed)
Date:  08/03/2020   Name:  Carolyn Dunlap   DOB:  1950-09-30   MRN:  [id number]   Chief Complaint: toe swelling (X1 year, right foot little toe, swollen all the time, painful, looks discolored sometimes)  HPI Toe pain - about one year ago she stumped her toe and is certain that is was broken but she never sought treatment.  Now she is having more pain and the little toe appears more red and swollen at the end of the day.  She is worried about her circulation and possible gangrene.  Lab Results  Component Value Date   CREATININE 0.80 06/24/2020   BUN 7 (L) 01/06/2020   NA 136 01/06/2020   K 4.0 01/06/2020   CL 99 01/06/2020   CO2 22 01/06/2020   Lab Results  Component Value Date   CHOL 142 06/29/2019   HDL 66 06/29/2019   LDLCALC 65 06/29/2019   TRIG 47 06/29/2019   CHOLHDL 2.2 06/29/2019   Lab Results  Component Value Date   TSH 4.960 (H) 01/06/2020   Lab Results  Component Value Date   HGBA1C 5.4 05/26/2015   Lab Results  Component Value Date   WBC 5.2 05/21/2020   HGB 11.8 (L) 05/21/2020   HCT 35.6 (L) 05/21/2020   MCV 87.7 05/21/2020   PLT 201 05/21/2020   Lab Results  Component Value Date   ALT 11 01/06/2020   AST 15 01/06/2020   ALKPHOS 174 (H) 01/06/2020   BILITOT 0.4 01/06/2020     Review of Systems  Constitutional: Negative for chills, fatigue and fever.  Respiratory: Negative for chest tightness and shortness of breath.   Cardiovascular: Negative for chest pain.  Musculoskeletal: Positive for joint swelling.  Skin: Positive for color change.    Patient Active Problem List   Diagnosis Date Noted  . Weight loss 06/14/2020  . Pleural effusion, left 06/14/2020  . COPD mixed type ([place]) 06/14/2020  . Aortic atherosclerosis ([place]) 01/06/2020  . Disorder of thyroid 08/04/2019  . Dizziness 07/29/2018  . Imbalance 07/29/2018  . Frequent falls 07/24/2018  . Tremor, unspecified 06/25/2018  . Special screening for malignant neoplasms, colon   .  Tobacco use disorder 05/29/2016  . Migraine without aura and without status migrainosus, not intractable 03/28/2015  . GERD without esophagitis 03/28/2015  . Osteoporosis 03/28/2015  . Insomnia 03/28/2015  . Moderately severe recurrent major depression ([place]) 03/28/2015  . Anxiety disorder 03/28/2015    Allergies  Allergen Reactions  . Zoloft [Sertraline Hcl]     diaphoresis  . Adhesive [Tape] Rash    Band-aids  . Penicillins Rash    Past Surgical History:  Procedure Laterality Date  . AUGMENTATION MAMMAPLASTY Bilateral 1976  . BREAST ENHANCEMENT SURGERY  1976  . CATARACT EXTRACTION  2002  . COLONOSCOPY WITH PROPOFOL N/A 07/10/2017   Procedure: COLONOSCOPY WITH PROPOFOL;  Surgeon: Lin Landsman, MD;  Location: [facility];  Service: Endoscopy;  Laterality: N/A;  . POLYPECTOMY N/A 07/10/2017   Procedure: POLYPECTOMY;  Surgeon: Lin Landsman, MD;  Location: [facility];  Service: Endoscopy;  Laterality: N/A;    Social History   Tobacco Use  . Smoking status: Current Every Day Smoker    Packs/day: 1.00    Years: 30.00    Pack years: 30.00    Types: Cigarettes  . Smokeless tobacco: Never Used  . Tobacco comment: 0.5PPD 06/24/2020  Vaping Use  . Vaping Use: Never used  Substance Use Topics  .  Alcohol use: Yes    Alcohol/week: 4.0 standard drinks    Types: 4 Glasses of wine per week  . Drug use: No     Medication list has been reviewed and updated.  Current Meds  Medication Sig  . albuterol (VENTOLIN HFA) 108 (90 Base) MCG/ACT inhaler Inhale 2 puffs into the lungs every 6 (six) hours as needed for wheezing or shortness of breath.  . ARIPiprazole (ABILIFY) 5 MG tablet Take 5 mg by mouth daily.  Marland Kitchen buPROPion (WELLBUTRIN XL) 150 MG 24 hr tablet Take 1 tablet (150 mg total) by mouth daily. (Patient taking differently: Take 300 mg by mouth daily.)  . Calcium-Magnesium-Vitamin D (CALCIUM 500 PO) Take by mouth.  . cyclobenzaprine (FLEXERIL) 10 MG  tablet Take 1 tablet (10 mg total) by mouth at bedtime. (Patient taking differently: Take 10 mg by mouth as needed.)  . famotidine (PEPCID) 20 MG tablet Take 1 tablet (20 mg total) by mouth 2 (two) times daily. (Patient taking differently: Take 20 mg by mouth 2 (two) times daily. PRN only)  . furosemide (LASIX) 20 MG tablet Take 1 tablet (20 mg total) by mouth daily as needed.  . mirtazapine (REMERON) 45 MG tablet Take 45 mg by mouth at bedtime.  . Multiple Vitamin (MULTI-DAY PO) Take by mouth.  . SUMAtriptan (IMITREX) 100 MG tablet TAKE 1 TABLET DAILY AS     DIRECTED BY YOUR DOCTOR (Patient taking differently: as needed.)  . Tiotropium Bromide Monohydrate (SPIRIVA RESPIMAT) 1.25 MCG/ACT AERS Inhale 2 puffs into the lungs daily. (Patient taking differently: Inhale 2 puffs into the lungs as needed.)  . traZODone (DESYREL) 100 MG tablet     PHQ 2/9 Scores 08/03/2020 03/21/2020 01/06/2020 06/29/2019  PHQ - 2 Score 4 6 6 3   PHQ- 9 Score 16 22 22 9     GAD 7 : Generalized Anxiety Score 08/03/2020 01/06/2020 03/11/2018 01/24/2018  Nervous, Anxious, on Edge 1 3 3 3   Control/stop worrying 1 3 3 3   Worry too much - different things 1 3 3 3   Trouble relaxing 0 1 3 3   Restless 0 1 3 3   Easily annoyed or irritable 0 0 0 2  Afraid - awful might happen 1 2 3 3   Total GAD 7 Score 4 13 18 20   Anxiety Difficulty - Very difficult Extremely difficult Extremely difficult    BP Readings from Last 3 Encounters:  08/03/20 (!) 86/64  06/24/20 124/78  06/14/20 116/70    Physical Exam Vitals and nursing note reviewed.  Constitutional:      General: She is not in acute distress.    Appearance: She is well-developed.  HENT:     Head: Normocephalic and atraumatic.  Pulmonary:     Effort: Pulmonary effort is normal. No respiratory distress.  Musculoskeletal:     Comments: Right 5th toe slightly larger then expected.  Skin is dry with some peeling between the toes.  A warty lesion is noted on the dorsal aspect.   Capillary refill is brisk on all 5 toes.   No tenderness to palpation of the toes or the forefoot.  Skin:    General: Skin is warm and dry.     Findings: No rash.  Neurological:     Mental Status: She is alert and oriented to person, place, and time.  Psychiatric:        Mood and Affect: Mood normal.        Behavior: Behavior normal.     Wt Readings from Last  3 Encounters:  08/03/20 90 lb (40.8 kg)  06/24/20 93 lb (42.2 kg)  06/14/20 94 lb 6.4 oz (42.8 kg)    BP (!) 86/64   Pulse 71   Temp 98.1 F (36.7 C) (Oral)   Ht 5\' 7"  (1.702 m)   Wt 90 lb (40.8 kg)   SpO2 95%   BMI 14.10 kg/m   Assessment and Plan: 1. Toe pain, right Patient is reassured there is no evidence of circulatory compromise or infection The earlier fracture may be have healed less than optimally causing some foot pain Will refer to podiatry  - Ambulatory referral to Podiatry   Partially dictated using Dragon software. Any errors are unintentional.  Halina Maidens, MD [facility]  08/03/2020

## 2020-08-12 DIAGNOSIS — F17200 Nicotine dependence, unspecified, uncomplicated: Secondary | ICD-10-CM | POA: Diagnosis not present

## 2020-08-12 DIAGNOSIS — F418 Other specified anxiety disorders: Secondary | ICD-10-CM | POA: Diagnosis not present

## 2020-08-12 DIAGNOSIS — F332 Major depressive disorder, recurrent severe without psychotic features: Secondary | ICD-10-CM | POA: Diagnosis not present

## 2020-08-15 DIAGNOSIS — M2042 Other hammer toe(s) (acquired), left foot: Secondary | ICD-10-CM | POA: Diagnosis not present

## 2020-08-15 DIAGNOSIS — M79671 Pain in right foot: Secondary | ICD-10-CM | POA: Diagnosis not present

## 2020-09-19 ENCOUNTER — Other Ambulatory Visit: Payer: Self-pay

## 2020-09-19 ENCOUNTER — Encounter: Payer: Self-pay | Admitting: Internal Medicine

## 2020-09-19 ENCOUNTER — Ambulatory Visit (INDEPENDENT_AMBULATORY_CARE_PROVIDER_SITE_OTHER): Payer: Medicare Other | Admitting: Internal Medicine

## 2020-09-19 VITALS — BP 96/64 | HR 79 | Temp 97.6°F | Ht 67.0 in | Wt 84.0 lb

## 2020-09-19 DIAGNOSIS — E440 Moderate protein-calorie malnutrition: Secondary | ICD-10-CM | POA: Diagnosis not present

## 2020-09-19 DIAGNOSIS — K760 Fatty (change of) liver, not elsewhere classified: Secondary | ICD-10-CM

## 2020-09-19 DIAGNOSIS — F332 Major depressive disorder, recurrent severe without psychotic features: Secondary | ICD-10-CM | POA: Diagnosis not present

## 2020-09-19 DIAGNOSIS — I700 Atherosclerosis of aorta: Secondary | ICD-10-CM | POA: Diagnosis not present

## 2020-09-19 DIAGNOSIS — J441 Chronic obstructive pulmonary disease with (acute) exacerbation: Secondary | ICD-10-CM | POA: Diagnosis not present

## 2020-09-19 MED ORDER — AZITHROMYCIN 250 MG PO TABS: ORAL_TABLET | ORAL | 0 refills | Status: AC

## 2020-09-19 MED ORDER — HYDROCODONE-HOMATROPINE 5-1.5 MG/5ML PO SYRP: 5.0000 mL | ORAL_SOLUTION | Freq: Four times a day (QID) | ORAL | 0 refills | Status: AC | PRN

## 2020-09-19 MED ORDER — PREDNISONE 10 MG PO TABS: 10.0000 mg | ORAL_TABLET | ORAL | 0 refills | Status: AC

## 2020-09-19 NOTE — Patient Instructions (Signed)
Start adding a Protein powder to other soft foods like pudding and oatmeal.   YOu can also add extra to your Carnation instant breakfast.

## 2020-09-19 NOTE — Progress Notes (Signed)
Date:  09/19/2020   Name:  Carolyn Dunlap   DOB:  1951-05-02   MRN:  [id number]   Chief Complaint: Cough (X1 week, congested, coughing up "white foamy stuff")  Cough This is a new problem. The current episode started in the past 7 days. The problem has been gradually worsening. The problem occurs every few minutes. The cough is non-productive. Associated symptoms include nasal congestion, shortness of breath, weight loss and wheezing. Pertinent negatives include no chest pain, chills, ear congestion, fever, headaches or sore throat. She has tried a beta-agonist inhaler for the symptoms. The treatment provided no relief. Her past medical history is significant for COPD.   Depression/weight loss - she is still on multiple medications from Psychiatry.  Her weight continues to decrease, despite nutritional supplementation with Carnation IB.  Last pleural effusion evaluation suggested transudative.   Hepatic steatosis - seen on CT to further characterize a renal mass - small and likely a cyst. CT ABDOMEN W WO CONTRAST  Result Date: 06/24/2020 CLINICAL DATA:  Ultrasound demonstrating solid lesions adjacent the lower pole of left kidney. EXAM: CT ABDOMEN WITHOUT AND WITH CONTRAST TECHNIQUE: Multidetector CT imaging of the abdomen was performed following the standard protocol before and following the bolus administration of intravenous contrast. CONTRAST:  59mL OMNIPAQUE IOHEXOL 300 MG/ML  SOLN COMPARISON:  Ultrasound 06/09/2020.  Chest radiograph of 06/14/2020. FINDINGS: Lower chest: Septal thickening at the lung bases. Left greater than right base dependent atelectasis. Mild cardiomegaly. Small left pleural effusion with pleural thickening indicative of chronicity. Bilateral calcified breast implants. Hepatobiliary: Mild hepatic steatosis. The inferior right hepatic lobe is partially excluded. Normal liver. Normal gallbladder, without biliary ductal dilatation. Pancreas: Normal, without mass or ductal  dilatation. Spleen: 2 splenules identified adjacent the left kidney, accounting for the ultrasound abnormality. Maximally 1.5 cm. Adrenals/Urinary Tract: Normal adrenal glands. No suspicious renal mass. A too small to characterize lower pole right renal lesion is likely a cyst at 2 mm. Normal left kidney. Stomach/Bowel: Tiny hiatal hernia.  Normal abdominal bowel loops. Vascular/Lymphatic: Aortic atherosclerosis. No retroperitoneal or retrocrural adenopathy. Other: No abdominal ascites. Musculoskeletal: No acute osseous abnormality. IMPRESSION: 1. The ultrasound abnormality was secondary to splenules. 2.  No acute abdominal process. 3. Left-sided chronic small pleural effusion. Bibasilar atelectasis and septal thickening, possibly representing mild pulmonary edema. 4.  Aortic Atherosclerosis (ICD10-I70.0). 5. Hepatic steatosis. Electronically Signed   By: Abigail Miyamoto M.D.   On: 06/24/2020 08:41   Pulmonary Function Test [facility] Only  Result Date: 06/23/2020 Spirometry Data Is Acceptable and Reproducible Moderate/Severe RESTRICTIVE LUNG DISEASE without Significant Broncho-Dilator Response Consider outpatient Pulmonary Consultation if needed Clinical Correlation Advised   Aortic Atherosclerosis - noted on CT. Due to fragile condition and ongoing weight loss, she is not a candidate for statin medication.   Lab Results  Component Value Date   CREATININE 0.80 06/24/2020   BUN 7 (L) 01/06/2020   NA 136 01/06/2020   K 4.0 01/06/2020   CL 99 01/06/2020   CO2 22 01/06/2020   Lab Results  Component Value Date   CHOL 142 06/29/2019   HDL 66 06/29/2019   LDLCALC 65 06/29/2019   TRIG 47 06/29/2019   CHOLHDL 2.2 06/29/2019   Lab Results  Component Value Date   TSH 4.960 (H) 01/06/2020   Lab Results  Component Value Date   HGBA1C 5.4 05/26/2015   Lab Results  Component Value Date   WBC 5.2 05/21/2020   HGB 11.8 (L) 05/21/2020   HCT  35.6 (L) 05/21/2020   MCV 87.7 05/21/2020   PLT 201 05/21/2020    Lab Results  Component Value Date   ALT 11 01/06/2020   AST 15 01/06/2020   ALKPHOS 174 (H) 01/06/2020   BILITOT 0.4 01/06/2020     Review of Systems  Constitutional: Positive for unexpected weight change and weight loss. Negative for chills, diaphoresis and fever.  HENT: Positive for congestion. Negative for sore throat and trouble swallowing.   Respiratory: Positive for cough, shortness of breath and wheezing. Negative for chest tightness.   Cardiovascular: Negative for chest pain and palpitations.  Gastrointestinal: Negative for abdominal pain, constipation and diarrhea.  Neurological: Negative for dizziness and headaches.    Patient Active Problem List   Diagnosis Date Noted  . Weight loss 06/14/2020  . Pleural effusion, left 06/14/2020  . COPD mixed type ([place]) 06/14/2020  . Aortic atherosclerosis ([place]) 01/06/2020  . Disorder of thyroid 08/04/2019  . Dizziness 07/29/2018  . Imbalance 07/29/2018  . Frequent falls 07/24/2018  . Tremor, unspecified 06/25/2018  . Special screening for malignant neoplasms, colon   . Tobacco use disorder 05/29/2016  . Migraine without aura and without status migrainosus, not intractable 03/28/2015  . GERD without esophagitis 03/28/2015  . Osteoporosis 03/28/2015  . Insomnia 03/28/2015  . Moderately severe recurrent major depression ([place]) 03/28/2015  . Anxiety disorder 03/28/2015    Allergies  Allergen Reactions  . Zoloft [Sertraline Hcl]     diaphoresis  . Adhesive [Tape] Rash    Band-aids  . Penicillins Rash    Past Surgical History:  Procedure Laterality Date  . AUGMENTATION MAMMAPLASTY Bilateral 1976  . BREAST ENHANCEMENT SURGERY  1976  . CATARACT EXTRACTION  2002  . COLONOSCOPY WITH PROPOFOL N/A 07/10/2017   Procedure: COLONOSCOPY WITH PROPOFOL;  Surgeon: Lin Landsman, MD;  Location: [facility];  Service: Endoscopy;  Laterality: N/A;  . POLYPECTOMY N/A 07/10/2017   Procedure: POLYPECTOMY;  Surgeon: Lin Landsman, MD;  Location: [facility];  Service: Endoscopy;  Laterality: N/A;    Social History   Tobacco Use  . Smoking status: Current Every Day Smoker    Packs/day: 1.00    Years: 30.00    Pack years: 30.00    Types: Cigarettes  . Smokeless tobacco: Never Used  . Tobacco comment: 0.5PPD 06/24/2020  Vaping Use  . Vaping Use: Never used  Substance Use Topics  . Alcohol use: Yes    Alcohol/week: 4.0 standard drinks    Types: 4 Glasses of wine per week  . Drug use: No     Medication list has been reviewed and updated.  Current Meds  Medication Sig  . albuterol (VENTOLIN HFA) 108 (90 Base) MCG/ACT inhaler Inhale 2 puffs into the lungs every 6 (six) hours as needed for wheezing or shortness of breath.  . ARIPiprazole (ABILIFY) 5 MG tablet Take 5 mg by mouth daily.  Marland Kitchen buPROPion (WELLBUTRIN XL) 150 MG 24 hr tablet Take 1 tablet (150 mg total) by mouth daily. (Patient taking differently: Take 300 mg by mouth daily.)  . Calcium-Magnesium-Vitamin D (CALCIUM 500 PO) Take by mouth.  . cyclobenzaprine (FLEXERIL) 10 MG tablet Take 1 tablet (10 mg total) by mouth at bedtime. (Patient taking differently: Take 10 mg by mouth as needed.)  . famotidine (PEPCID) 20 MG tablet Take 1 tablet (20 mg total) by mouth 2 (two) times daily. (Patient taking differently: Take 20 mg by mouth 2 (two) times daily. PRN only)  . furosemide (LASIX) 20  MG tablet Take 1 tablet (20 mg total) by mouth daily as needed.  . mirtazapine (REMERON) 45 MG tablet Take 45 mg by mouth at bedtime.  . Multiple Vitamin (MULTI-DAY PO) Take by mouth.  . SUMAtriptan (IMITREX) 100 MG tablet TAKE 1 TABLET DAILY AS     DIRECTED BY YOUR DOCTOR (Patient taking differently: as needed.)  . Tiotropium Bromide Monohydrate (SPIRIVA RESPIMAT) 1.25 MCG/ACT AERS Inhale 2 puffs into the lungs daily. (Patient taking differently: Inhale 2 puffs into the lungs as needed.)  . traZODone (DESYREL) 100 MG tablet   . zolpidem (AMBIEN) 10 MG  tablet Take 10 mg by mouth at bedtime as needed.    PHQ 2/9 Scores 09/19/2020 08/03/2020 03/21/2020 01/06/2020  PHQ - 2 Score 2 4 6 6   PHQ- 9 Score 3 16 22 22     GAD 7 : Generalized Anxiety Score 09/19/2020 08/03/2020 01/06/2020 03/11/2018  Nervous, Anxious, on Edge 2 1 3 3   Control/stop worrying 2 1 3 3   Worry too much - different things 2 1 3 3   Trouble relaxing 1 0 1 3  Restless 0 0 1 3  Easily annoyed or irritable 1 0 0 0  Afraid - awful might happen 1 1 2 3   Total GAD 7 Score 9 4 13 18   Anxiety Difficulty - - Very difficult Extremely difficult    BP Readings from Last 3 Encounters:  09/19/20 96/64  08/03/20 (!) 86/64  06/24/20 124/78    Physical Exam Vitals and nursing note reviewed.  Constitutional:      General: She is not in acute distress.    Appearance: She is cachectic. She is ill-appearing.  HENT:     Head: Normocephalic and atraumatic.  Cardiovascular:     Rate and Rhythm: Normal rate and regular rhythm.  Pulmonary:     Effort: Pulmonary effort is normal. No respiratory distress.     Breath sounds: Examination of the right-upper field reveals wheezing. Examination of the left-upper field reveals wheezing. Examination of the right-lower field reveals decreased breath sounds. Examination of the left-lower field reveals decreased breath sounds. Decreased breath sounds and wheezing present. No rhonchi.  Musculoskeletal:     Cervical back: Normal range of motion.  Lymphadenopathy:     Cervical: No cervical adenopathy.  Skin:    General: Skin is warm and dry.     Findings: No rash.  Neurological:     Mental Status: She is alert and oriented to person, place, and time.  Psychiatric:        Mood and Affect: Mood is depressed. Affect is flat.        Speech: Speech normal.     Wt Readings from Last 3 Encounters:  09/19/20 84 lb (38.1 kg)  08/03/20 90 lb (40.8 kg)  06/24/20 93 lb (42.2 kg)    BP 96/64   Pulse 79   Temp 97.6 F (36.4 C) (Oral)   Ht 5\' 7"   (1.702 m)   Wt 84 lb (38.1 kg)   SpO2 93%   BMI 13.16 kg/m   Assessment and Plan: 1. COPD with acute exacerbation ([place]) Will start prednisone, zpak and cough syrup Message sent to Pulmonary to schedule follow up - predniSONE (DELTASONE) 10 MG tablet; Take 1 tablet (10 mg total) by mouth as directed for 6 days. Take 6,5,4,3,2,1 then stop  Dispense: 21 tablet; Refill: 0 - azithromycin (ZITHROMAX Z-PAK) 250 MG tablet; UAD  Dispense: 6 each; Refill: 0 - HYDROcodone-homatropine (HYCODAN) 5-1.5 MG/5ML syrup; Take 5  mLs by mouth every 6 (six) hours as needed for up to 10 days for cough.  Dispense: 120 mL; Refill: 0  2. Moderately severe recurrent major depression ([place]) Under care of Psychiatry On multiple medications  3. Protein-calorie malnutrition, moderate ([facility]) Continue Carnation IB Add protein powder to pudding, oatmeal etc  4. Aortic atherosclerosis ([place]) Not a candidate for statin therapy  5. Hepatic steatosis Seen on CT. Last hepatic function panel was normal.   Partially dictated using Editor, commissioning. Any errors are unintentional.  Halina Maidens, MD [facility]  09/19/2020

## 2020-10-06 NOTE — Progress Notes (Signed)
Date:  10/07/2020   Name:  Carolyn Dunlap   DOB:  1950-12-01   MRN:  [id number]   Chief Complaint: Annual Exam  Carolyn Dunlap is a 70 y.o. female who presents today for her Complete Annual Exam. She feels fairly well. She reports exercising- none. She reports she is sleeping poorly. Breast complaints- none.  Mammogram: 04/2019 DEXA: 04/2019 osteoporosis Pap smear: discontinued Colonoscopy: 06/2017 repeat 2024  Immunization History  Administered Date(s) Administered  . Influenza,inj,quad, With Preservative 04/04/2018  . Influenza-Unspecified 04/08/2017, 03/12/2019, 03/22/2020  . PFIZER(Purple Top)SARS-COV-2 Vaccination 07/29/2019, 08/19/2019, 03/11/2020  . Pneumococcal Conjugate-13 05/26/2015  . Pneumococcal Polysaccharide-23 05/29/2016  . Zoster 02/09/2011  . Zoster Recombinat (Shingrix) 06/25/2017, 06/10/2018    Depression        This is a chronic problem.The problem is unchanged.  Associated symptoms include decreased concentration, fatigue, hopelessness, insomnia, decreased interest and appetite change.  Associated symptoms include no headaches and no suicidal ideas.  Treatments tried: multiple medications by psych.  Compliance with treatment is good. Migraine  The current episode started more than 1 year ago. The problem has been unchanged. The pain quality is similar to prior headaches. Associated symptoms include insomnia. Pertinent negatives include no abdominal pain, coughing, dizziness, fever, hearing loss, tinnitus or vomiting. She has tried Excedrin for the symptoms.  COPD - rarely uses albuterol or spiriva.  She has intermittent congestion which she treats as needed.  She is not aware of chronic wheezes or sputum production.  Aortic atherosclerosis - discussed findings noted on CT abdomen 06/2020.  Due to weight issues and possible side effects, would not start a statin right away.  Rec ASA daily and check lipid profile.  Underweight/malnutrition - doing better - using  protein powder and has gained about 5 lbs.  Appetite is slightly better as well.  Lab Results  Component Value Date   CREATININE 0.80 06/24/2020   BUN 7 (L) 01/06/2020   NA 136 01/06/2020   K 4.0 01/06/2020   CL 99 01/06/2020   CO2 22 01/06/2020   Lab Results  Component Value Date   CHOL 142 06/29/2019   HDL 66 06/29/2019   LDLCALC 65 06/29/2019   TRIG 47 06/29/2019   CHOLHDL 2.2 06/29/2019   Lab Results  Component Value Date   TSH 4.960 (H) 01/06/2020   Lab Results  Component Value Date   HGBA1C 5.4 05/26/2015   Lab Results  Component Value Date   WBC 5.2 05/21/2020   HGB 11.8 (L) 05/21/2020   HCT 35.6 (L) 05/21/2020   MCV 87.7 05/21/2020   PLT 201 05/21/2020   Lab Results  Component Value Date   ALT 11 01/06/2020   AST 15 01/06/2020   ALKPHOS 174 (H) 01/06/2020   BILITOT 0.4 01/06/2020     Review of Systems  Constitutional: Positive for appetite change and fatigue. Negative for chills and fever.  HENT: Negative for congestion, hearing loss, tinnitus, trouble swallowing and voice change.   Eyes: Negative for visual disturbance.  Respiratory: Negative for cough, chest tightness, shortness of breath and wheezing.   Cardiovascular: Negative for chest pain, palpitations and leg swelling.  Gastrointestinal: Negative for abdominal pain, constipation, diarrhea and vomiting.  Endocrine: Negative for polydipsia and polyuria.  Genitourinary: Negative for dysuria, frequency, genital sores and vaginal bleeding.  Musculoskeletal: Negative for arthralgias, gait problem and joint swelling.  Skin: Negative for color change and rash.  Neurological: Negative for dizziness, tremors, light-headedness and headaches.  Hematological: Negative for adenopathy.  Does not bruise/bleed easily.  Psychiatric/Behavioral: Positive for decreased concentration, depression, dysphoric mood and sleep disturbance. Negative for suicidal ideas. The patient is nervous/anxious and has insomnia.      Patient Active Problem List   Diagnosis Date Noted  . Protein-calorie malnutrition, moderate ([place]) 06/14/2020  . Pleural effusion, left 06/14/2020  . COPD mixed type ([place]) 06/14/2020  . Aortic atherosclerosis ([place]) 01/06/2020  . Disorder of thyroid 08/04/2019  . Dizziness 07/29/2018  . Imbalance 07/29/2018  . Frequent falls 07/24/2018  . Tremor, unspecified 06/25/2018  . Special screening for malignant neoplasms, colon   . Tobacco use disorder 05/29/2016  . Migraine without aura and without status migrainosus, not intractable 03/28/2015  . GERD without esophagitis 03/28/2015  . Osteoporosis 03/28/2015  . Insomnia 03/28/2015  . Moderately severe recurrent major depression ([place]) 03/28/2015  . Anxiety disorder 03/28/2015    Allergies  Allergen Reactions  . Zoloft [Sertraline Hcl]     diaphoresis  . Adhesive [Tape] Rash    Band-aids  . Penicillins Rash    Past Surgical History:  Procedure Laterality Date  . AUGMENTATION MAMMAPLASTY Bilateral 1976  . BREAST ENHANCEMENT SURGERY  1976  . CATARACT EXTRACTION  2002  . COLONOSCOPY WITH PROPOFOL N/A 07/10/2017   Procedure: COLONOSCOPY WITH PROPOFOL;  Surgeon: Lin Landsman, MD;  Location: [place];  Service: Endoscopy;  Laterality: N/A;  . POLYPECTOMY N/A 07/10/2017   Procedure: POLYPECTOMY;  Surgeon: Lin Landsman, MD;  Location: [place];  Service: Endoscopy;  Laterality: N/A;    Social History   Tobacco Use  . Smoking status: Current Every Day Smoker    Packs/day: 0.50    Years: 30.00    Pack years: 15.00    Types: Cigarettes  . Smokeless tobacco: Never Used  . Tobacco comment: 0.5PPD 06/24/2020  Vaping Use  . Vaping Use: Never used  Substance Use Topics  . Alcohol use: Yes    Alcohol/week: 4.0 standard drinks    Types: 4 Glasses of wine per week  . Drug use: No     Medication list has been reviewed and updated.  Current Meds  Medication Sig  . albuterol (VENTOLIN HFA) 108  (90 Base) MCG/ACT inhaler Inhale 2 puffs into the lungs every 6 (six) hours as needed for wheezing or shortness of breath.  . ARIPiprazole (ABILIFY) 5 MG tablet Take 5 mg by mouth daily.  Marland Kitchen buPROPion (WELLBUTRIN XL) 300 MG 24 hr tablet Take 300 mg by mouth daily.  . Calcium-Magnesium-Vitamin D (CALCIUM 500 PO) Take 1 tablet by mouth daily.  . cyclobenzaprine (FLEXERIL) 10 MG tablet Take 1 tablet (10 mg total) by mouth at bedtime. (Patient taking differently: Take 10 mg by mouth as needed.)  . famotidine (PEPCID) 20 MG tablet Take 1 tablet (20 mg total) by mouth 2 (two) times daily. (Patient taking differently: Take 20 mg by mouth 2 (two) times daily. PRN only)  . mirtazapine (REMERON) 45 MG tablet Take 45 mg by mouth at bedtime.  . Multiple Vitamin (MULTI-DAY PO) Take 1 tablet by mouth daily.  . SUMAtriptan (IMITREX) 100 MG tablet TAKE 1 TABLET DAILY AS     DIRECTED BY YOUR DOCTOR (Patient taking differently: as needed.)  . Tiotropium Bromide Monohydrate (SPIRIVA RESPIMAT) 1.25 MCG/ACT AERS Inhale 2 puffs into the lungs daily. (Patient taking differently: Inhale 2 puffs into the lungs as needed.)  . traZODone (DESYREL) 100 MG tablet Take 100 mg by mouth at bedtime.  Marland Kitchen zolpidem (AMBIEN) 10 MG tablet Take  10 mg by mouth at bedtime as needed.    PHQ 2/9 Scores 10/07/2020 09/19/2020 08/03/2020 03/21/2020  PHQ - 2 Score 6 2 4 6   PHQ- 9 Score 24 3 16 22     GAD 7 : Generalized Anxiety Score 10/07/2020 09/19/2020 08/03/2020 01/06/2020  Nervous, Anxious, on Edge 3 2 1 3   Control/stop worrying 3 2 1 3   Worry too much - different things 3 2 1 3   Trouble relaxing 3 1 0 1  Restless 2 0 0 1  Easily annoyed or irritable 1 1 0 0  Afraid - awful might happen 3 1 1 2   Total GAD 7 Score 18 9 4 13   Anxiety Difficulty Extremely difficult - - Very difficult    BP Readings from Last 3 Encounters:  10/07/20 96/74  09/19/20 96/64  08/03/20 (!) 86/64    Physical Exam Vitals and nursing note reviewed.   Constitutional:      General: She is not in acute distress.    Appearance: She is well-developed. She is not ill-appearing.  HENT:     Head: Normocephalic and atraumatic.     Right Ear: Tympanic membrane and ear canal normal.     Left Ear: Tympanic membrane and ear canal normal.     Nose:     Right Sinus: No maxillary sinus tenderness.     Left Sinus: No maxillary sinus tenderness.  Eyes:     General: No scleral icterus.       Right eye: No discharge.        Left eye: No discharge.     Conjunctiva/sclera: Conjunctivae normal.  Neck:     Thyroid: No thyromegaly.     Vascular: No carotid bruit.  Cardiovascular:     Rate and Rhythm: Normal rate and regular rhythm.     Pulses: Normal pulses.     Heart sounds: Normal heart sounds.  Pulmonary:     Effort: Pulmonary effort is normal. No respiratory distress.     Breath sounds: No wheezing.  Chest:  Breasts:     Right: No mass, nipple discharge, skin change or tenderness.     Left: No mass, nipple discharge, skin change or tenderness.      Comments: bilat breast implants hard, mobile, non tender Abdominal:     General: Bowel sounds are normal.     Palpations: Abdomen is soft.     Tenderness: There is no abdominal tenderness.  Musculoskeletal:     Cervical back: Normal range of motion. No erythema.     Right lower leg: No edema.     Left lower leg: No edema.  Lymphadenopathy:     Cervical: No cervical adenopathy.  Skin:    General: Skin is warm and dry.     Capillary Refill: Capillary refill takes less than 2 seconds.     Findings: No rash.  Neurological:     General: No focal deficit present.     Mental Status: She is alert and oriented to person, place, and time.     Cranial Nerves: No cranial nerve deficit.     Sensory: No sensory deficit.     Deep Tendon Reflexes: Reflexes are normal and symmetric.  Psychiatric:        Attention and Perception: Attention normal.        Mood and Affect: Mood normal.     Wt  Readings from Last 3 Encounters:  10/07/20 89 lb 6.4 oz (40.6 kg)  09/19/20 84 lb (38.1 kg)  08/03/20  90 lb (40.8 kg)    BP 96/74 (BP Location: Right Arm, Patient Position: Sitting, Cuff Size: Normal)   Pulse 88   Temp 97.6 F (36.4 C) (Oral)   Ht 5\' 7"  (1.702 m)   Wt 89 lb 6.4 oz (40.6 kg)   BMI 14.00 kg/m   Assessment and Plan: 1. Protein-calorie malnutrition, moderate (HCC) Weight is up 5 lbs with use of protein supplements - CBC with Differential/Platelet - Comprehensive metabolic panel - TSH + free T4  2. Aortic atherosclerosis (HCC) Recommend aspirin 81 mg daily Consider statin therapy - Lipid panel  3. Migraine without aura and without status migrainosus, not intractable No recent headaches requiring prescription medication Use Excedrin as needed - follow up if worsening  4. COPD mixed type ([place]) Encourage the daily use of Spiriva which is only being taken PRN  5. Moderately severe recurrent major depression (HCC) Severe sx on multiple medications being followed by Psych  6. Encounter for screening mammogram for breast cancer - MM 3D SCREEN BREAST BILATERAL; Future   Partially dictated using Editor, commissioning. Any errors are unintentional.  Halina Maidens, MD [facility]  10/07/2020

## 2020-10-07 ENCOUNTER — Other Ambulatory Visit: Payer: Self-pay

## 2020-10-07 ENCOUNTER — Ambulatory Visit (INDEPENDENT_AMBULATORY_CARE_PROVIDER_SITE_OTHER): Payer: Medicare Other | Admitting: Internal Medicine

## 2020-10-07 ENCOUNTER — Encounter: Payer: Self-pay | Admitting: Internal Medicine

## 2020-10-07 VITALS — BP 96/74 | HR 88 | Temp 97.6°F | Ht 67.0 in | Wt 89.4 lb

## 2020-10-07 DIAGNOSIS — E440 Moderate protein-calorie malnutrition: Secondary | ICD-10-CM

## 2020-10-07 DIAGNOSIS — Z1231 Encounter for screening mammogram for malignant neoplasm of breast: Secondary | ICD-10-CM | POA: Diagnosis not present

## 2020-10-07 DIAGNOSIS — F332 Major depressive disorder, recurrent severe without psychotic features: Secondary | ICD-10-CM | POA: Diagnosis not present

## 2020-10-07 DIAGNOSIS — I700 Atherosclerosis of aorta: Secondary | ICD-10-CM

## 2020-10-07 DIAGNOSIS — J449 Chronic obstructive pulmonary disease, unspecified: Secondary | ICD-10-CM

## 2020-10-07 DIAGNOSIS — G43009 Migraine without aura, not intractable, without status migrainosus: Secondary | ICD-10-CM | POA: Diagnosis not present

## 2020-10-07 NOTE — Patient Instructions (Signed)
Enteric coated baby aspirin - take one a day

## 2020-10-08 LAB — LIPID PANEL
Chol/HDL Ratio: 2.6 ratio (ref 0.0–4.4)
Cholesterol, Total: 186 mg/dL (ref 100–199)
HDL: 71 mg/dL (ref >39)
LDL Chol Calc (NIH): 101 mg/dL — ABNORMAL HIGH (ref 0–99)
Triglycerides: 78 mg/dL (ref 0–149)
VLDL Cholesterol Cal: 14 mg/dL (ref 5–40)

## 2020-10-08 LAB — CBC WITH DIFFERENTIAL/PLATELET
Basophils Absolute: 0 10*3/uL (ref 0.0–0.2)
Basos: 0 %
EOS (ABSOLUTE): 0 10*3/uL (ref 0.0–0.4)
Eos: 0 %
Hematocrit: 34.2 % (ref 34.0–46.6)
Hemoglobin: 11.6 g/dL (ref 11.1–15.9)
Immature Grans (Abs): 0 10*3/uL (ref 0.0–0.1)
Immature Granulocytes: 0 %
Lymphocytes Absolute: 0.7 10*3/uL (ref 0.7–3.1)
Lymphs: 8 %
MCH: 28 pg (ref 26.6–33.0)
MCHC: 33.9 g/dL (ref 31.5–35.7)
MCV: 83 fL (ref 79–97)
Monocytes Absolute: 0.4 10*3/uL (ref 0.1–0.9)
Monocytes: 4 %
Neutrophils Absolute: 8.5 10*3/uL — ABNORMAL HIGH (ref 1.4–7.0)
Neutrophils: 88 %
Platelets: 291 10*3/uL (ref 150–450)
RBC: 4.14 x10E6/uL (ref 3.77–5.28)
RDW: 15.7 % — ABNORMAL HIGH (ref 11.7–15.4)
WBC: 9.6 10*3/uL (ref 3.4–10.8)

## 2020-10-08 LAB — COMPREHENSIVE METABOLIC PANEL
ALT: 17 IU/L (ref 0–32)
AST: 17 IU/L (ref 0–40)
Albumin/Globulin Ratio: 1.3 (ref 1.2–2.2)
Albumin: 4.1 g/dL (ref 3.8–4.8)
Alkaline Phosphatase: 204 IU/L — ABNORMAL HIGH (ref 44–121)
BUN/Creatinine Ratio: 12 (ref 12–28)
BUN: 8 mg/dL (ref 8–27)
Bilirubin Total: 0.3 mg/dL (ref 0.0–1.2)
CO2: 23 mmol/L (ref 20–29)
Calcium: 9.2 mg/dL (ref 8.7–10.3)
Chloride: 96 mmol/L (ref 96–106)
Creatinine, Ser: 0.65 mg/dL (ref 0.57–1.00)
Globulin, Total: 3.2 g/dL (ref 1.5–4.5)
Glucose: 112 mg/dL — ABNORMAL HIGH (ref 65–99)
Potassium: 3.9 mmol/L (ref 3.5–5.2)
Sodium: 138 mmol/L (ref 134–144)
Total Protein: 7.3 g/dL (ref 6.0–8.5)
eGFR: 96 mL/min/{1.73_m2} (ref >59)

## 2020-10-08 LAB — TSH+FREE T4
Free T4: 1.25 ng/dL (ref 0.82–1.77)
TSH: 4.2 u[IU]/mL (ref 0.450–4.500)

## 2020-10-10 DIAGNOSIS — F332 Major depressive disorder, recurrent severe without psychotic features: Secondary | ICD-10-CM | POA: Diagnosis not present

## 2020-10-10 DIAGNOSIS — F17210 Nicotine dependence, cigarettes, uncomplicated: Secondary | ICD-10-CM | POA: Diagnosis not present

## 2020-10-10 DIAGNOSIS — Z79899 Other long term (current) drug therapy: Secondary | ICD-10-CM | POA: Diagnosis not present

## 2020-10-10 DIAGNOSIS — F418 Other specified anxiety disorders: Secondary | ICD-10-CM | POA: Diagnosis not present

## 2020-10-25 ENCOUNTER — Ambulatory Visit
Admission: RE | Admit: 2020-10-25 | Discharge: 2020-10-25 | Disposition: A | Discharge status: Home / Self Care | Payer: Medicare Other | Attending: Adult Health | Admitting: Adult Health

## 2020-10-25 ENCOUNTER — Other Ambulatory Visit: Payer: Self-pay

## 2020-10-25 ENCOUNTER — Ambulatory Visit
Admission: RE | Admit: 2020-10-25 | Discharge: 2020-10-25 | Disposition: A | Discharge status: Home / Self Care | Payer: Medicare Other | Source: Ambulatory Visit | Attending: Adult Health | Admitting: Adult Health

## 2020-10-25 ENCOUNTER — Encounter: Payer: Self-pay | Admitting: Adult Health

## 2020-10-25 ENCOUNTER — Ambulatory Visit (INDEPENDENT_AMBULATORY_CARE_PROVIDER_SITE_OTHER): Payer: Medicare Other | Admitting: Adult Health

## 2020-10-25 ENCOUNTER — Ambulatory Visit: Payer: Medicare Other | Admitting: Adult Health

## 2020-10-25 VITALS — BP 92/62 | HR 78 | Temp 97.1°F | Ht 67.0 in | Wt 91.4 lb

## 2020-10-25 DIAGNOSIS — J439 Emphysema, unspecified: Secondary | ICD-10-CM | POA: Diagnosis not present

## 2020-10-25 DIAGNOSIS — J449 Chronic obstructive pulmonary disease, unspecified: Secondary | ICD-10-CM | POA: Insufficient documentation

## 2020-10-25 DIAGNOSIS — E440 Moderate protein-calorie malnutrition: Secondary | ICD-10-CM

## 2020-10-25 DIAGNOSIS — J90 Pleural effusion, not elsewhere classified: Secondary | ICD-10-CM | POA: Diagnosis not present

## 2020-10-25 MED ORDER — SPIRIVA RESPIMAT 1.25 MCG/ACT IN AERS: 2.0000 | INHALATION_SPRAY | Freq: Every day | RESPIRATORY_TRACT | 0 refills | Status: DC

## 2020-10-25 NOTE — Patient Instructions (Addendum)
Continue on Spiriva 2 puffs daily .  Activity as tolerated.  Lasix 20mg  daily as needed for fluid retention .  High protein, consider ensure/boost between meals .  Chest xray today  CT chest as planned in November (LDCT screening program)  Work on not smoking.  Follow up in 6  months with Dr. Patsey Berthold   at [facility] office and As needed

## 2020-10-25 NOTE — Assessment & Plan Note (Signed)
COPD with emphysema.  Patient is encouraged on smoking cessation.  Continue on Spiriva.  Activity as tolerated.  Continue part of the low-dose CT screening program  Plan  Patient Instructions  Continue on Spiriva 2 puffs daily .  Activity as tolerated.  Lasix 20mg  daily as needed for fluid retention .  High protein, consider ensure/boost between meals .  Chest xray today  CT chest as planned in November (LDCT screening program)  Work on not smoking.  Follow up in 6  months with Dr. Patsey Berthold   at [facility] office and As needed

## 2020-10-25 NOTE — Progress Notes (Signed)
@Patient  ID: Carolyn Dunlap, female    DOB: [birth year], 70 y.o.   MRN: [id number]  Chief Complaint  Patient presents with  . Follow-up    Referring provider: Glean Hess, MD  HPI: 70 year old female seen for pulmonary consult May 20, 2020 for small left pleural effusion-transudative, negative cytology  TEST/EVENTS :  Low-dose CT chest April 20, 2020 with lung RADS 1, small left pleural effusion Moderate emphysema, previously described left lower lobe pulmonary nodule not readily apparent.  Smooth septal thickening in the right lung base is similar.  Postinfectious/inflammatory right lung base volume loss and scarring.  Biapical pleural-parenchymal scarring.  No suspicious pulmonary nodules or masses   05/2020- left thoracentesis via interventional radiology.  200 cc of amber-colored fluid with removal.  This was transudate of by lights criteria.  Cytology was negative for malignant cells.   Lab work showed autoimmune and connective tissue labs were negative.   2D echo showed a preserved EF at 60 to 01%, grade 1 diastolic dysfunction.   abdominal ultrasound that was essentially unremarkable except to rounded solid abnormality seen adjacent to the lower pole of the left kidney.  Largest measuring 1.7 cm.  Patient had a subsequent CT abdomen to further evaluate.This was completed this morning that showed mild hepatic steatosis, spleen 2 splenules adjacent to the left kidney maximum 1.5 cm.  And a left-sided chronic small pleural effusion with bibasilar atelectasis and septal thickening  PFT June 22, 2020 that showed severe restriction with an FEV1 at 48%, ratio 99, FVC 37%.  No significant bronchodilator response.  Total lung capacity 66%.  DLCO 35%.  10/25/2020 Follow up : COPD with Emphysema , Pleural Effusion , Smoker  Patient returns for a 42-month follow-up.  Patient was seen last visit she is being followed for a small left pleural effusion that was found on low-dose CT  screening.  CT in November 2021 showed small left pleural effusion and moderate emphysema.  Patient underwent a thoracentesis in December with 200 cc of fluid removed.  Was transudative by light's criteria.  Cytology was negative for malignant cells.  Autoimmune and connective tissue work-up was negative.  2D echo did show grade 1 diastolic dysfunction.  Pulmonary function testing showed mixed COPD.  At last visit she was started on Spiriva.  She was also given Lasix 20 mg daily to use as needed for edema. Says she rarely uses .  Since last visit patient is feeling about the same, gets winded with minimal activity . Says she is sedentary , does not do housework . Minimal cooking. Can drive. Can not walk long distances.  Patient continues to smoke.  Smoking cessation was discussed.   She was recommended to follow-up with primary care regarding ongoing weight loss and abnormal CT abdomen that showed hepatic steatosis and 2 splenules. Weight is down 20 pounds over the last 2 years.  She currently weighs 91 pounds. She was seen by PCP last month for follow up . Now on high protein diet with suppplements.    Allergies  Allergen Reactions  . Zoloft [Sertraline Hcl]     diaphoresis  . Adhesive [Tape] Rash    Band-aids  . Penicillins Rash    Immunization History  Administered Date(s) Administered  . Influenza,inj,quad, With Preservative 04/04/2018  . Influenza-Unspecified 04/08/2017, 03/12/2019, 03/22/2020  . PFIZER(Purple Top)SARS-COV-2 Vaccination 07/29/2019, 08/19/2019, 03/11/2020  . Pneumococcal Conjugate-13 05/26/2015  . Pneumococcal Polysaccharide-23 05/29/2016  . Zoster 02/09/2011  . Zoster Recombinat (Shingrix) 06/25/2017, 06/10/2018  Past Medical History:  Diagnosis Date  . Depression   . GERD (gastroesophageal reflux disease)   . Insomnia   . Migraine headache    weekly  . PONV (postoperative nausea and vomiting)    in distant past    Tobacco History: Social History    Tobacco Use  Smoking Status Current Every Day Smoker  . Packs/day: 0.50  . Years: 30.00  . Pack years: 15.00  . Types: Cigarettes  Smokeless Tobacco Never Used  Tobacco Comment   0.5PPD  10/25/2020   Ready to quit: No Counseling given: Not Answered Comment: 0.5PPD  10/25/2020   Outpatient Medications Prior to Visit  Medication Sig Dispense Refill  . albuterol (VENTOLIN HFA) 108 (90 Base) MCG/ACT inhaler Inhale 2 puffs into the lungs every 6 (six) hours as needed for wheezing or shortness of breath. 8 g 2  . ARIPiprazole (ABILIFY) 5 MG tablet Take 5 mg by mouth daily.    Marland Kitchen buPROPion (WELLBUTRIN XL) 300 MG 24 hr tablet Take 300 mg by mouth daily.    . Calcium-Magnesium-Vitamin D (CALCIUM 500 PO) Take 1 tablet by mouth daily.    . cyclobenzaprine (FLEXERIL) 10 MG tablet Take 1 tablet (10 mg total) by mouth at bedtime. (Patient taking differently: Take 10 mg by mouth as needed.) 30 tablet 1  . famotidine (PEPCID) 20 MG tablet Take 1 tablet (20 mg total) by mouth 2 (two) times daily. (Patient taking differently: Take 20 mg by mouth 2 (two) times daily. PRN only) 60 tablet 1  . mirtazapine (REMERON) 45 MG tablet Take 45 mg by mouth at bedtime.    . Multiple Vitamin (MULTI-DAY PO) Take 1 tablet by mouth daily.    . risperiDONE (RISPERDAL) 0.5 MG tablet Take by mouth.    . SUMAtriptan (IMITREX) 100 MG tablet TAKE 1 TABLET DAILY AS     DIRECTED BY YOUR DOCTOR (Patient taking differently: as needed.) 27 tablet 3  . Tiotropium Bromide Monohydrate (SPIRIVA RESPIMAT) 1.25 MCG/ACT AERS Inhale 2 puffs into the lungs daily. (Patient taking differently: Inhale 2 puffs into the lungs as needed.) 4 g 11  . traZODone (DESYREL) 100 MG tablet Take 100 mg by mouth at bedtime.    Marland Kitchen zolpidem (AMBIEN) 10 MG tablet Take 10 mg by mouth at bedtime as needed.     No facility-administered medications prior to visit.     Review of Systems:   Constitutional:   No  weight loss, night sweats,  Fevers, chills,   +fatigue, or  lassitude.  HEENT:   No headaches,  Difficulty swallowing,  Tooth/dental problems, or  Sore throat,                No sneezing, itching, ear ache, nasal congestion, post nasal drip,   CV:  No chest pain,  Orthopnea, PND, swelling in lower extremities, anasarca, dizziness, palpitations, syncope.   GI  No heartburn, indigestion, abdominal pain, nausea, vomiting, diarrhea, change in bowel habits, loss of appetite, bloody stools.   Resp:    No chest wall deformity  Skin: no rash or lesions.  GU: no dysuria, change in color of urine, no urgency or frequency.  No flank pain, no hematuria   MS:  No joint pain or swelling.  No decreased range of motion.  No back pain.    Physical Exam  BP 92/62 (BP Location: Left Arm, Patient Position: Sitting, Cuff Size: Normal)   Pulse 78   Temp (!) 97.1 F (36.2 C) (Temporal)   Ht  5\' 7"  (1.702 m)   Wt 91 lb 6.4 oz (41.5 kg)   SpO2 96%   BMI 14.32 kg/m   GEN: A/Ox3; pleasant , NAD,thin and cachexic    HEENT:  [place]/AT,    NOSE-clear, THROAT-clear, no lesions, no postnasal drip or exudate noted.   NECK:  Supple w/ fair ROM; no JVD; normal carotid impulses w/o bruits; no thyromegaly or nodules palpated; no lymphadenopathy.    RESP  Clear  P & A; w/o, wheezes/ rales/ or rhonchi. no accessory muscle use, no dullness to percussion  CARD:  RRR, no m/r/g, no peripheral edema, pulses intact, no cyanosis or clubbing.  GI:   Soft & nt; nml bowel sounds; no organomegaly or masses detected.   Musco: Warm bil, no deformities or joint swelling noted.   Neuro: alert, no focal deficits noted.    Skin: Warm, no lesions or rashes    Lab Results:  CBC    Component Value Date/Time   WBC 9.6 10/07/2020 1047   WBC 5.2 05/21/2020 1146   RBC 4.14 10/07/2020 1047   RBC 4.06 05/21/2020 1146   HGB 11.6 10/07/2020 1047   HCT 34.2 10/07/2020 1047   PLT 291 10/07/2020 1047   MCV 83 10/07/2020 1047   MCH 28.0 10/07/2020 1047   MCH 29.1  05/21/2020 1146   MCHC 33.9 10/07/2020 1047   MCHC 33.1 05/21/2020 1146   RDW 15.7 (H) 10/07/2020 1047   LYMPHSABS 0.7 10/07/2020 1047   MONOABS 0.3 05/21/2020 1146   EOSABS 0.0 10/07/2020 1047   BASOSABS 0.0 10/07/2020 1047    BMET    Component Value Date/Time   NA 138 10/07/2020 1047   K 3.9 10/07/2020 1047   CL 96 10/07/2020 1047   CO2 23 10/07/2020 1047   GLUCOSE 112 (H) 10/07/2020 1047   BUN 8 10/07/2020 1047   CREATININE 0.65 10/07/2020 1047   CALCIUM 9.2 10/07/2020 1047   GFRNONAA 90 01/06/2020 1009   GFRAA 104 01/06/2020 1009    BNP No results found for: BNP  ProBNP No results found for: PROBNP  Imaging: No results found.    PFT Results Latest Ref Rng & Units 06/22/2020  FVC-Predicted Pre % 37  FVC-Post L 1.28  FVC-Predicted Post % 37  Pre FEV1/FVC % % 99  Post FEV1/FCV % % 95  FEV1-Pre L 1.27  FEV1-Predicted Pre % 48  FEV1-Post L 1.22  DLCO uncorrected ml/min/mmHg 7.70  DLCO [facility]% % 35  DLVA Predicted % 62  TLC L 3.68  TLC % Predicted % 66  RV % Predicted % 89    No results found for: NITRICOXIDE      Assessment & Plan:   COPD mixed type (HCC) COPD with emphysema.  Patient is encouraged on smoking cessation.  Continue on Spiriva.  Activity as tolerated.  Continue part of the low-dose CT screening program  Plan  Patient Instructions  Continue on Spiriva 2 puffs daily .  Activity as tolerated.  Lasix 20mg  daily as needed for fluid retention .  High protein, consider ensure/boost between meals .  Chest xray today  CT chest as planned in November (LDCT screening program)  Work on not smoking.  Follow up in 6  months with Dr. Patsey Berthold   at [facility] office and As needed        Pleural effusion, left Small left pleural effusion.  Transudative in nature.  Cytology was negative.  Autoimmune connective tissue work-up was negative.  2D echo showed a grade 1  diastolic dysfunction. She may use Lasix 20 mg daily as needed. Chest x-ray  today  Plan  Patient Instructions  Continue on Spiriva 2 puffs daily .  Activity as tolerated.  Lasix 20mg  daily as needed for fluid retention .  High protein, consider ensure/boost between meals .  Chest xray today  CT chest as planned in November (LDCT screening program)  Work on not smoking.  Follow up in 6  months with Dr. Patsey Berthold   at [facility] office and As needed        Protein-calorie malnutrition, moderate ([place]) High protein diet      Rexene Edison, NP 10/25/2020

## 2020-10-25 NOTE — Assessment & Plan Note (Signed)
High-protein diet 

## 2020-10-25 NOTE — Progress Notes (Signed)
Agree with the details of the visit as noted by Tammy Parrett, NP.  C. Laura Breanah Faddis, MD [facility] 

## 2020-10-25 NOTE — Assessment & Plan Note (Signed)
Small left pleural effusion.  Transudative in nature.  Cytology was negative.  Autoimmune connective tissue work-up was negative.  2D echo showed a grade 1 diastolic dysfunction. She may use Lasix 20 mg daily as needed. Chest x-ray today  Plan  Patient Instructions  Continue on Spiriva 2 puffs daily .  Activity as tolerated.  Lasix 20mg  daily as needed for fluid retention .  High protein, consider ensure/boost between meals .  Chest xray today  CT chest as planned in November (LDCT screening program)  Work on not smoking.  Follow up in 6  months with Dr. Patsey Berthold   at [facility] office and As needed

## 2020-12-07 DIAGNOSIS — F332 Major depressive disorder, recurrent severe without psychotic features: Secondary | ICD-10-CM | POA: Diagnosis not present

## 2020-12-07 DIAGNOSIS — Z79899 Other long term (current) drug therapy: Secondary | ICD-10-CM | POA: Diagnosis not present

## 2020-12-07 DIAGNOSIS — F17210 Nicotine dependence, cigarettes, uncomplicated: Secondary | ICD-10-CM | POA: Diagnosis not present

## 2020-12-07 DIAGNOSIS — F418 Other specified anxiety disorders: Secondary | ICD-10-CM | POA: Diagnosis not present

## 2020-12-09 ENCOUNTER — Telehealth: Payer: Self-pay

## 2020-12-09 NOTE — Telephone Encounter (Signed)
Copied from [place] [phone]. Topic: General - Other >> Dec 09, 2020 11:31 AM Celene Kras wrote: Reason for CRM: Pt called stating that she is being advised by her therapist to be admitted into the hospital for weight loss. She is requesting to have PCP give a call back to give advice. Please advise.

## 2020-12-09 NOTE — Telephone Encounter (Signed)
Patient informed that if her therapist is recommending hospital care then she should follow her therapist instructions for this. I recommended [facility]. Patient said she verbalized understanding.

## 2020-12-12 DIAGNOSIS — R410 Disorientation, unspecified: Secondary | ICD-10-CM | POA: Diagnosis not present

## 2020-12-12 DIAGNOSIS — Z79899 Other long term (current) drug therapy: Secondary | ICD-10-CM | POA: Diagnosis not present

## 2020-12-12 DIAGNOSIS — I493 Ventricular premature depolarization: Secondary | ICD-10-CM | POA: Diagnosis not present

## 2020-12-12 DIAGNOSIS — J439 Emphysema, unspecified: Secondary | ICD-10-CM | POA: Diagnosis not present

## 2020-12-12 DIAGNOSIS — R634 Abnormal weight loss: Secondary | ICD-10-CM | POA: Diagnosis not present

## 2020-12-12 DIAGNOSIS — F17210 Nicotine dependence, cigarettes, uncomplicated: Secondary | ICD-10-CM | POA: Diagnosis not present

## 2020-12-12 DIAGNOSIS — F32A Depression, unspecified: Secondary | ICD-10-CM | POA: Diagnosis not present

## 2020-12-12 DIAGNOSIS — R062 Wheezing: Secondary | ICD-10-CM | POA: Diagnosis not present

## 2020-12-12 DIAGNOSIS — R630 Anorexia: Secondary | ICD-10-CM | POA: Diagnosis not present

## 2020-12-15 ENCOUNTER — Other Ambulatory Visit: Payer: Self-pay

## 2020-12-15 ENCOUNTER — Ambulatory Visit (INDEPENDENT_AMBULATORY_CARE_PROVIDER_SITE_OTHER): Payer: Medicare Other | Admitting: Internal Medicine

## 2020-12-15 ENCOUNTER — Encounter: Payer: Self-pay | Admitting: Internal Medicine

## 2020-12-15 VITALS — BP 138/82 | HR 92 | Temp 98.2°F | Ht 67.0 in | Wt 88.9 lb

## 2020-12-15 DIAGNOSIS — E440 Moderate protein-calorie malnutrition: Secondary | ICD-10-CM

## 2020-12-15 DIAGNOSIS — F332 Major depressive disorder, recurrent severe without psychotic features: Secondary | ICD-10-CM

## 2020-12-15 DIAGNOSIS — F411 Generalized anxiety disorder: Secondary | ICD-10-CM

## 2020-12-15 NOTE — Progress Notes (Signed)
Date:  12/15/2020   Name:  Carolyn Dunlap   DOB:  01/23/1951   MRN:  [id number]   Chief Complaint: Weight Loss (Anxiety and Weight loss. Was told by her psychiatrist to be admitted to the hospital for her weight loss. She went to the ER at [facility] and they told her her labs look good and they did not see reason to keep her. )  HPI Depression/anxiety -patient comes here to follow-up on her anxiety weight loss and decreased appetite.  She has been followed closely by psychiatry who advised that she go to the emergency room last week because of unclear concerns.  The patient did go to [facility] ER and had apparently essentially normal blood work (these are unable to be accessed in care everywhere).  She tells me that her assessment determined that she was not a danger to herself or others and that there was no indication for admission to the hospital.  The emergency room did recommend that she discontinue bupropion as this can cause appetite suppression.  As a result she continues on only mirtazapine 45 mg and trazodone 100 mg.  She does have close follow-up scheduled with her psychiatrist and her therapist at [place] [facility].  [facility] ER also referred her to a dietitian.  After brief discussion it is clear the patient has no appetite and that a dietitian is unlikely to have any effect on that circumstance.  She has been consuming Carnation instant breakfast augmented with protein powder but only once a day.  Yesterday she purchased some Ensure but has yet to try that.  In general she will consume 1 milkshake in the morning and a very small supper such as a sandwich.  Today her weight is stable at 88 pounds.  She has noticed some swelling in both ankles.  It has not been red warm or tender.  There is been no weeping of the skin.  Albumin levels from April were low normal.  Values from her recent emergency room visit are not available. Lab Results  Component Value Date   CREATININE 0.65  10/07/2020   BUN 8 10/07/2020   NA 138 10/07/2020   K 3.9 10/07/2020   CL 96 10/07/2020   CO2 23 10/07/2020   Lab Results  Component Value Date   CHOL 186 10/07/2020   HDL 71 10/07/2020   LDLCALC 101 (H) 10/07/2020   TRIG 78 10/07/2020   CHOLHDL 2.6 10/07/2020   Lab Results  Component Value Date   TSH 4.200 10/07/2020   Lab Results  Component Value Date   HGBA1C 5.4 05/26/2015   Lab Results  Component Value Date   WBC 9.6 10/07/2020   HGB 11.6 10/07/2020   HCT 34.2 10/07/2020   MCV 83 10/07/2020   PLT 291 10/07/2020   Lab Results  Component Value Date   ALT 17 10/07/2020   AST 17 10/07/2020   ALKPHOS 204 (H) 10/07/2020   BILITOT 0.3 10/07/2020     Review of Systems  Constitutional:  Positive for appetite change. Negative for chills, fatigue, fever and unexpected weight change.  HENT:  Negative for trouble swallowing.   Respiratory:  Negative for cough and wheezing.   Cardiovascular:  Positive for leg swelling. Negative for chest pain.  Gastrointestinal:  Negative for abdominal pain, blood in stool, constipation and diarrhea.  Psychiatric/Behavioral:  Positive for dysphoric mood and sleep disturbance. Negative for self-injury and suicidal ideas. The patient is nervous/anxious.    Patient Active  Problem List   Diagnosis Date Noted   Protein-calorie malnutrition, moderate ([place]) 06/14/2020   Pleural effusion, left 06/14/2020   COPD mixed type ([place]) 06/14/2020   Aortic atherosclerosis ([place]) 01/06/2020   Disorder of thyroid 08/04/2019   Dizziness 07/29/2018   Imbalance 07/29/2018   Frequent falls 07/24/2018   Tremor, unspecified 06/25/2018   Special screening for malignant neoplasms, colon    Tobacco use disorder 05/29/2016   Migraine without aura and without status migrainosus, not intractable 03/28/2015   GERD without esophagitis 03/28/2015   Osteoporosis 03/28/2015   Insomnia 03/28/2015   Moderately severe recurrent major depression ([place]) 03/28/2015    Anxiety disorder 03/28/2015    Allergies  Allergen Reactions   Zoloft [Sertraline Hcl]     diaphoresis   Adhesive [Tape] Rash    Band-aids   Penicillins Rash    Past Surgical History:  Procedure Laterality Date   AUGMENTATION MAMMAPLASTY Bilateral [place]   CATARACT EXTRACTION  2002   COLONOSCOPY WITH PROPOFOL N/A 07/10/2017   Procedure: COLONOSCOPY WITH PROPOFOL;  Surgeon: Lin Landsman, MD;  Location: [facility];  Service: Endoscopy;  Laterality: N/A;   POLYPECTOMY N/A 07/10/2017   Procedure: POLYPECTOMY;  Surgeon: Lin Landsman, MD;  Location: [place];  Service: Endoscopy;  Laterality: N/A;    Social History   Tobacco Use   Smoking status: Every Day    Packs/day: 0.50    Years: 30.00    Pack years: 15.00    Types: Cigarettes   Smokeless tobacco: Never   Tobacco comments:    0.5PPD  10/25/2020  Vaping Use   Vaping Use: Never used  Substance Use Topics   Alcohol use: Yes    Alcohol/week: 4.0 standard drinks    Types: 4 Glasses of wine per week   Drug use: No     Medication list has been reviewed and updated.  Current Meds  Medication Sig   albuterol (VENTOLIN HFA) 108 (90 Base) MCG/ACT inhaler Inhale 2 puffs into the lungs every 6 (six) hours as needed for wheezing or shortness of breath.   Calcium-Magnesium-Vitamin D (CALCIUM 500 PO) Take 1 tablet by mouth daily.   cyclobenzaprine (FLEXERIL) 10 MG tablet Take 1 tablet (10 mg total) by mouth at bedtime. (Patient taking differently: Take 10 mg by mouth as needed.)   famotidine (PEPCID) 20 MG tablet Take 1 tablet (20 mg total) by mouth 2 (two) times daily. (Patient taking differently: Take 20 mg by mouth 2 (two) times daily. PRN only)   mirtazapine (REMERON) 45 MG tablet Take 45 mg by mouth at bedtime.   Multiple Vitamin (MULTI-DAY PO) Take 1 tablet by mouth daily.   SUMAtriptan (IMITREX) 100 MG tablet TAKE 1 TABLET DAILY AS     DIRECTED BY YOUR DOCTOR  (Patient taking differently: as needed.)   Tiotropium Bromide Monohydrate (SPIRIVA RESPIMAT) 1.25 MCG/ACT AERS Inhale 2 puffs into the lungs daily. (Patient taking differently: Inhale 2 puffs into the lungs as needed.)   Tiotropium Bromide Monohydrate (SPIRIVA RESPIMAT) 1.25 MCG/ACT AERS Inhale 2 puffs into the lungs daily.   traZODone (DESYREL) 100 MG tablet Take 100 mg by mouth at bedtime.   zolpidem (AMBIEN) 10 MG tablet Take 10 mg by mouth at bedtime as needed.   [DISCONTINUED] ARIPiprazole (ABILIFY) 5 MG tablet Take 5 mg by mouth daily.   [DISCONTINUED] buPROPion (WELLBUTRIN XL) 300 MG 24 hr tablet Take 300 mg by mouth daily.    PHQ 2/9  Scores 12/15/2020 10/07/2020 09/19/2020 08/03/2020  PHQ - 2 Score 6 6 2 4   PHQ- 9 Score 22 24 3 16     GAD 7 : Generalized Anxiety Score 12/15/2020 10/07/2020 09/19/2020 08/03/2020  Nervous, Anxious, on Edge 3 3 2 1   Control/stop worrying 3 3 2 1   Worry too much - different things 3 3 2 1   Trouble relaxing 3 3 1  0  Restless 2 2 0 0  Easily annoyed or irritable 1 1 1  0  Afraid - awful might happen 3 3 1 1   Total GAD 7 Score 18 18 9 4   Anxiety Difficulty Extremely difficult Extremely difficult - -    BP Readings from Last 3 Encounters:  12/15/20 138/82  10/25/20 92/62  10/07/20 96/74    Physical Exam Vitals and nursing note reviewed.  Constitutional:      General: She is not in acute distress.    Appearance: She is cachectic.  HENT:     Head: Normocephalic and atraumatic.  Cardiovascular:     Rate and Rhythm: Normal rate and regular rhythm.     Pulses: Normal pulses.  Pulmonary:     Effort: Pulmonary effort is normal. No respiratory distress.     Breath sounds: Normal breath sounds and air entry.  Musculoskeletal:     Right lower leg: 1+ Edema present.     Left lower leg: 1+ Edema present.  Skin:    General: Skin is warm and dry.     Findings: No rash.  Neurological:     Mental Status: She is alert and oriented to person, place, and time.   Psychiatric:        Attention and Perception: Attention normal.        Mood and Affect: Mood is depressed.        Speech: Speech normal.        Behavior: Behavior normal.        Cognition and Memory: Cognition normal.    Wt Readings from Last 3 Encounters:  12/15/20 88 lb 14.4 oz (40.3 kg)  10/25/20 91 lb 6.4 oz (41.5 kg)  10/07/20 89 lb 6.4 oz (40.6 kg)    BP 138/82 (BP Location: Right Arm, Patient Position: Sitting, Cuff Size: Normal)   Pulse 92   Temp 98.2 F (36.8 C) (Oral)   Ht 5\' 7"  (1.702 m)   Wt 88 lb 14.4 oz (40.3 kg)   SpO2 96%   BMI 13.92 kg/m   Assessment and Plan: 1. Moderately severe recurrent major depression ([place]) Ongoing severe depression with anxiety. Recommend continued follow-up with psychiatry. Consider whether a lower dose of mirtazapine might have better appetite stimulating effects. Also consider genetic testing for best response to antipsychotics and antidepressants.  2. Generalized anxiety disorder Continue current medications as prescribed by psychiatry  3. Protein-calorie malnutrition, moderate ([facility]) We discussed continuing to use Ensure or milkshakes at least 2/day if possible. Mild ankle edema is likely due to low albumin levels.   Partially dictated using [place], [place]. Any errors are unintentional.  Halina Maidens, MD [facility]  12/15/2020

## 2020-12-15 NOTE — Patient Instructions (Signed)
Question - genetic test for the best anti-depressant medications  ? Lower dose mirtazapine having more appetite stimulation?

## 2020-12-20 DIAGNOSIS — F418 Other specified anxiety disorders: Secondary | ICD-10-CM | POA: Diagnosis not present

## 2020-12-20 DIAGNOSIS — Z79899 Other long term (current) drug therapy: Secondary | ICD-10-CM | POA: Diagnosis not present

## 2020-12-20 DIAGNOSIS — F332 Major depressive disorder, recurrent severe without psychotic features: Secondary | ICD-10-CM | POA: Diagnosis not present

## 2020-12-20 DIAGNOSIS — F17210 Nicotine dependence, cigarettes, uncomplicated: Secondary | ICD-10-CM | POA: Diagnosis not present

## 2021-01-17 DIAGNOSIS — F332 Major depressive disorder, recurrent severe without psychotic features: Secondary | ICD-10-CM | POA: Diagnosis not present

## 2021-01-17 DIAGNOSIS — F17210 Nicotine dependence, cigarettes, uncomplicated: Secondary | ICD-10-CM | POA: Diagnosis not present

## 2021-01-17 DIAGNOSIS — F418 Other specified anxiety disorders: Secondary | ICD-10-CM | POA: Diagnosis not present

## 2021-01-17 DIAGNOSIS — Z79899 Other long term (current) drug therapy: Secondary | ICD-10-CM | POA: Diagnosis not present

## 2021-01-20 IMAGING — US US ABDOMEN COMPLETE
1 series · 14 of 25 positions shown · non-contrast
Comparison: None.

CLINICAL DATA: Protein losing enteropathy.

EXAM:
ABDOMEN ULTRASOUND COMPLETE

[Series 1: us abdomen complete · 14 of 97 slices shown]
[im 1/97]
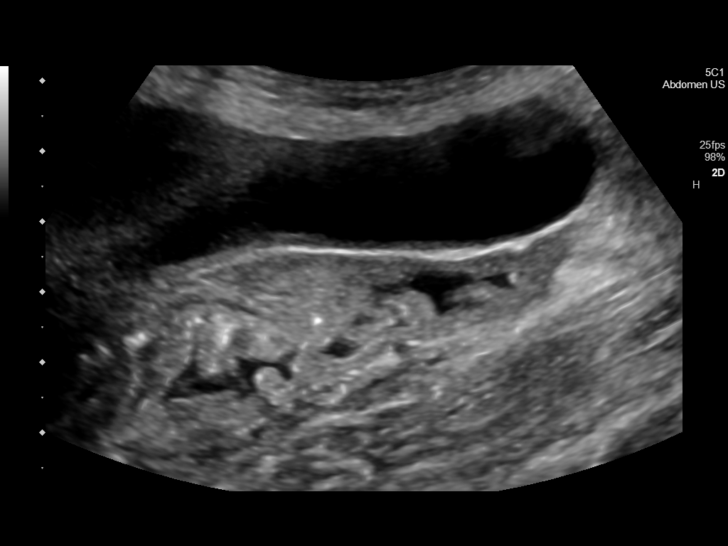
[im 9/97]
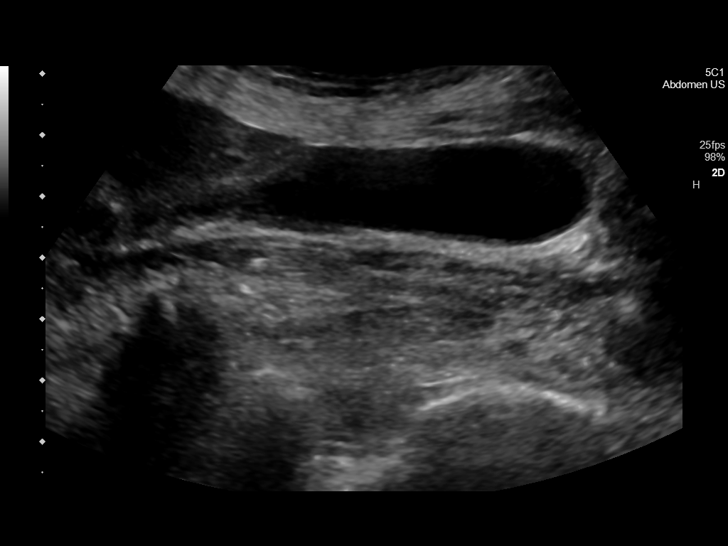
[im 17/97]
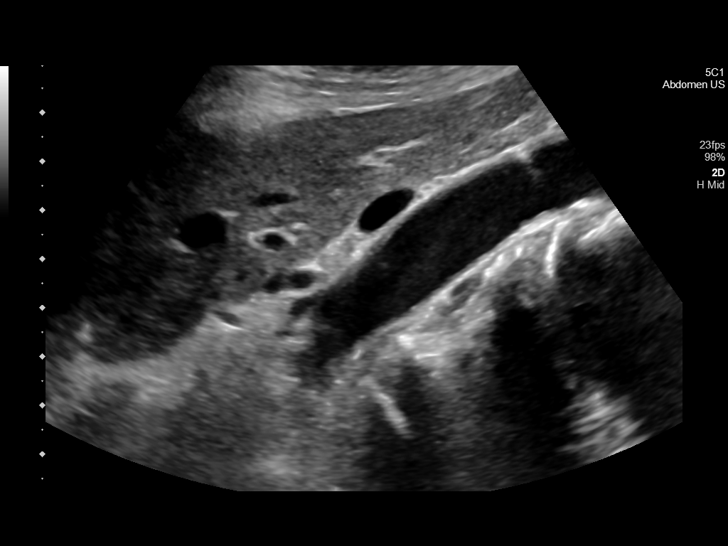
[im 25/97]
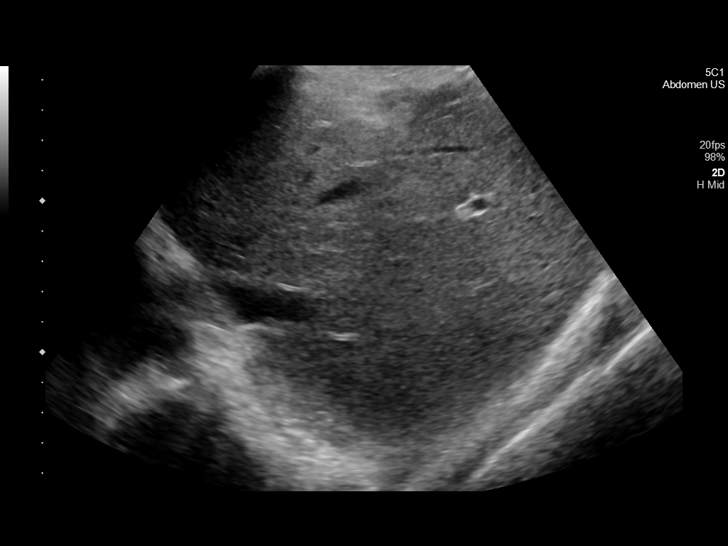
[im 33/97]
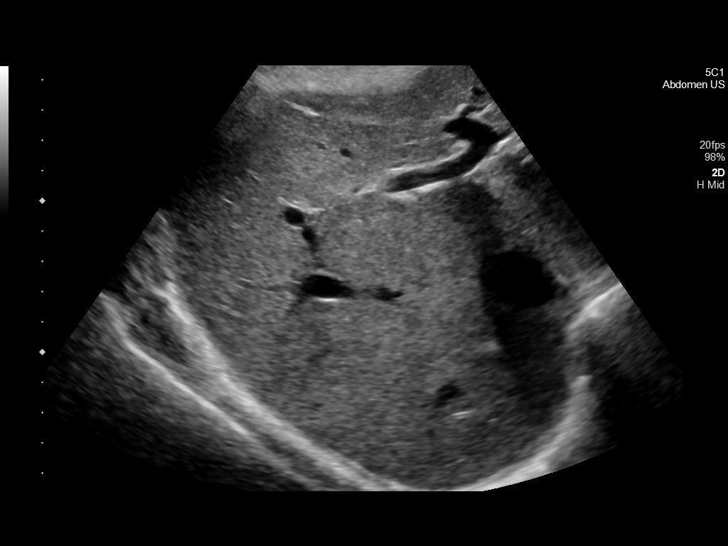
[im 37/97]
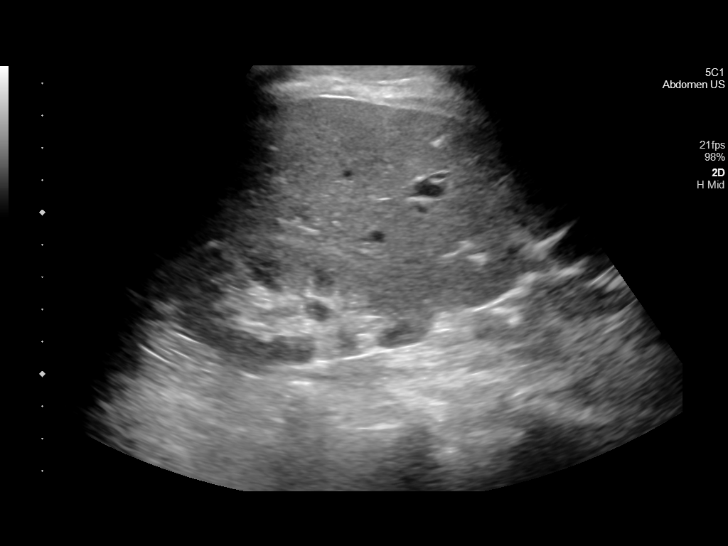
[im 45/97]
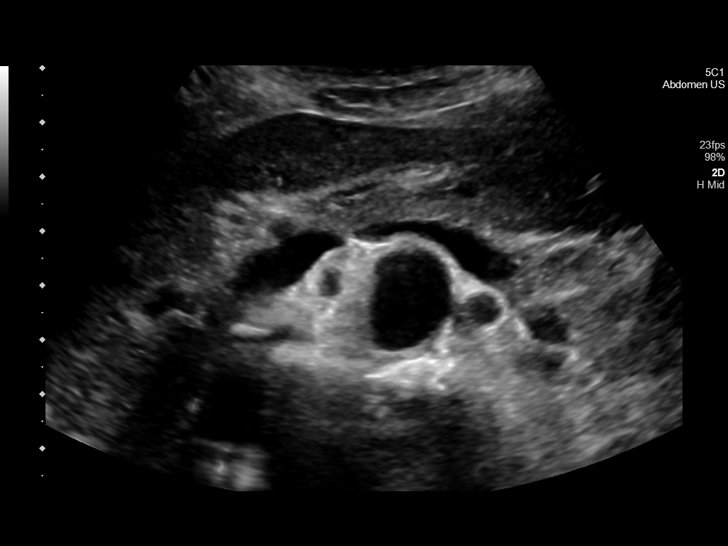
[im 53/97]
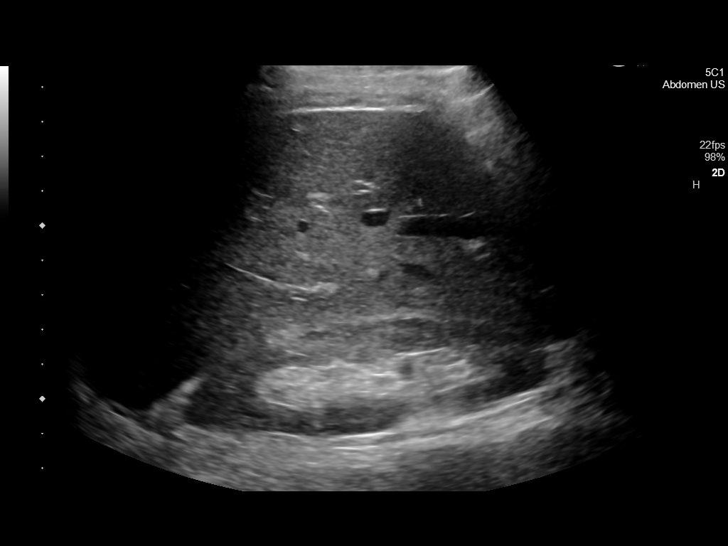
[im 61/97]
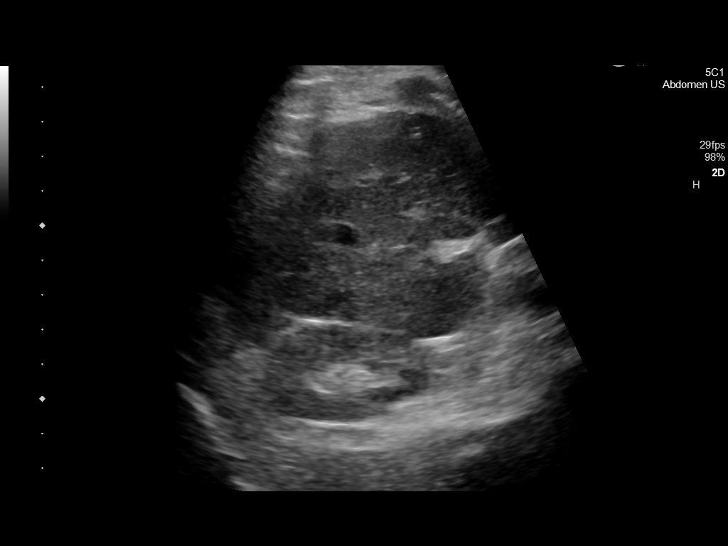
[im 65/97]
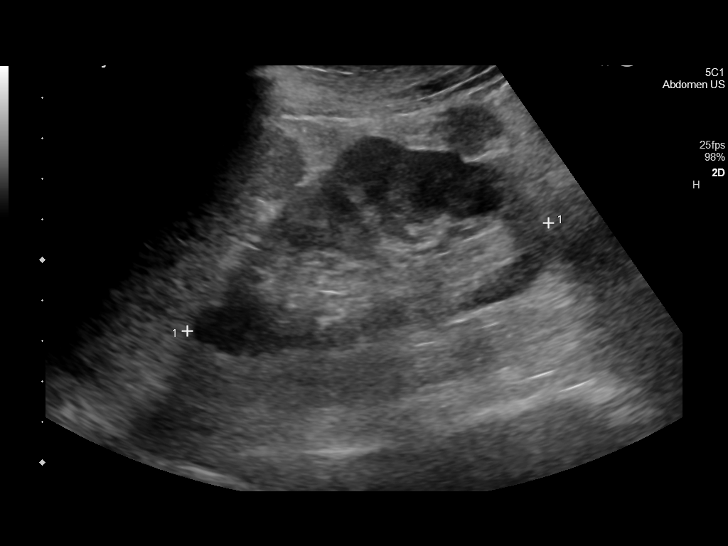
[im 73/97]
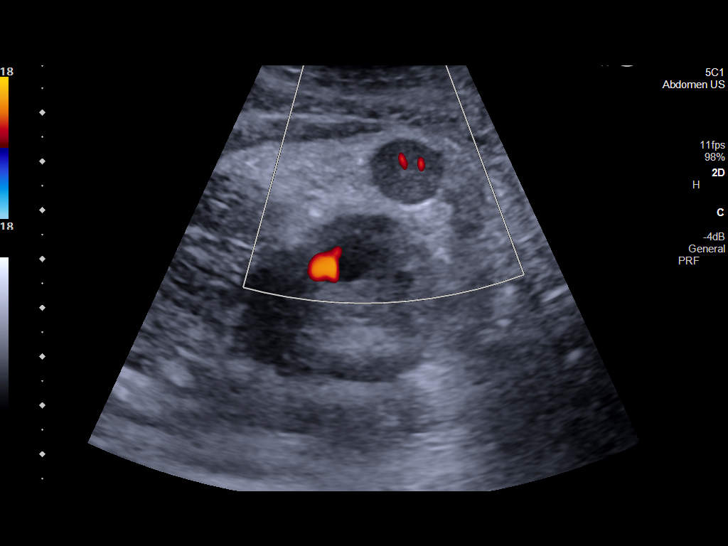
[im 81/97]
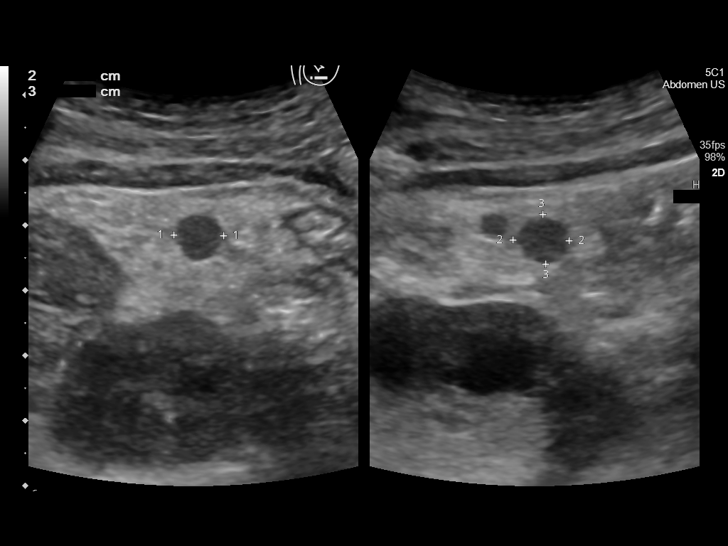
[im 89/97]
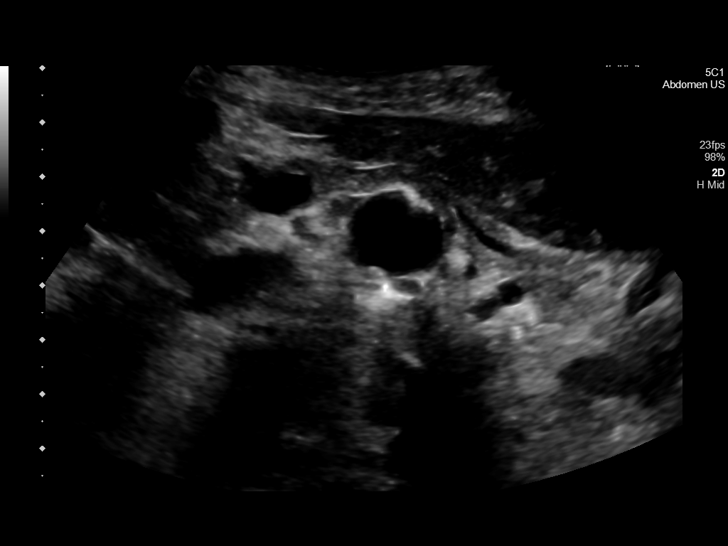
[im 97/97]
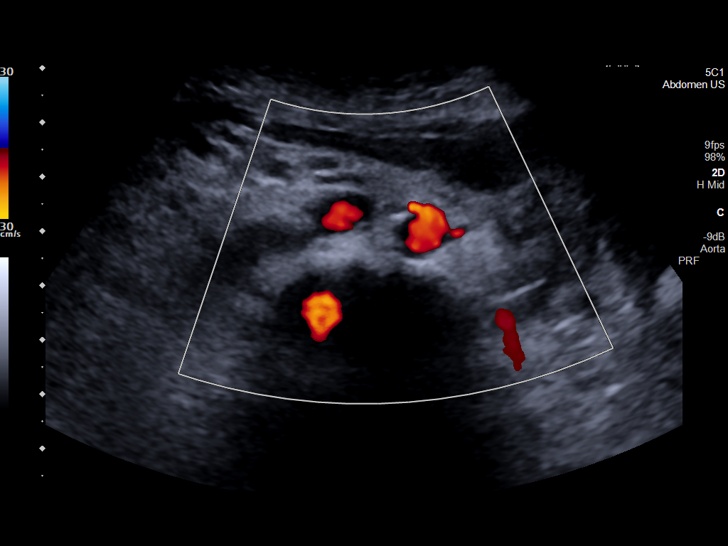

[14 of 25 positions shown; findings below may reference images not displayed]

FINDINGS: Gallbladder: No gallstones or wall thickening visualized. No
sonographic Murphy sign noted by sonographer.

Common bile duct: Diameter: 2 mm which is within normal limits.

Liver: No focal lesion identified. Within normal limits in
parenchymal echogenicity. Portal vein is patent on color Doppler
imaging with normal direction of blood flow towards the liver.

IVC: No abnormality visualized.

Pancreas: Visualized portion unremarkable.

Spleen: Size and appearance within normal limits.

Right Kidney: Length: 10.7 cm. Echogenicity within normal limits. No
mass or hydronephrosis visualized.

Left Kidney: Length: 9.3 cm. Echogenicity within normal limits. No
mass or hydronephrosis visualized.

Abdominal aorta: No aneurysm visualized.

Other findings: 2 rounded hypoechoic solid abnormalities are seen
adjacent to the lower pole of left kidney, the largest measuring
cm.
IMPRESSION: Two rounded solid abnormality seen adjacent to the lower pole of
left kidney, the largest measuring 1.7 cm in diameter. CT scan is
recommended for further evaluation.

## 2021-02-02 ENCOUNTER — Ambulatory Visit (INDEPENDENT_AMBULATORY_CARE_PROVIDER_SITE_OTHER): Payer: Medicare Other | Admitting: Internal Medicine

## 2021-02-02 ENCOUNTER — Encounter: Payer: Self-pay | Admitting: Internal Medicine

## 2021-02-02 ENCOUNTER — Other Ambulatory Visit: Payer: Self-pay

## 2021-02-02 VITALS — BP 102/78 | HR 98 | Temp 97.9°F | Ht 67.0 in | Wt 84.0 lb

## 2021-02-02 DIAGNOSIS — E440 Moderate protein-calorie malnutrition: Secondary | ICD-10-CM

## 2021-02-02 DIAGNOSIS — F332 Major depressive disorder, recurrent severe without psychotic features: Secondary | ICD-10-CM | POA: Diagnosis not present

## 2021-02-02 NOTE — Progress Notes (Signed)
Date:  02/02/2021   Name:  Carolyn Dunlap   DOB:  1951-01-25   MRN:  [id number]   Chief Complaint: Weight Check (Lost 4 pounds /)  HPI Underweight - She continues to lose weight - down 4 more pounds.  She has no appetite.  Recent labs were essentially normal.  She has been using ensure or premier protein 1-2 times per day but is eating very little.  Her Psych increased the dose of Effexor but did not change mirtazapine.   There is no change in her GAD7 or PHQ9 scores.  Lab Results  Component Value Date   CREATININE 0.65 10/07/2020   BUN 8 10/07/2020   NA 138 10/07/2020   K 3.9 10/07/2020   CL 96 10/07/2020   CO2 23 10/07/2020   Lab Results  Component Value Date   CHOL 186 10/07/2020   HDL 71 10/07/2020   LDLCALC 101 (H) 10/07/2020   TRIG 78 10/07/2020   CHOLHDL 2.6 10/07/2020   Lab Results  Component Value Date   TSH 4.200 10/07/2020   Lab Results  Component Value Date   HGBA1C 5.4 05/26/2015   Lab Results  Component Value Date   WBC 9.6 10/07/2020   HGB 11.6 10/07/2020   HCT 34.2 10/07/2020   MCV 83 10/07/2020   PLT 291 10/07/2020   Lab Results  Component Value Date   ALT 17 10/07/2020   AST 17 10/07/2020   ALKPHOS 204 (H) 10/07/2020   BILITOT 0.3 10/07/2020     Review of Systems  Constitutional:  Positive for appetite change and unexpected weight change. Negative for chills, fatigue and fever.  HENT:  Negative for trouble swallowing.   Respiratory:  Negative for cough, chest tightness and wheezing.   Cardiovascular:  Positive for leg swelling. Negative for chest pain.  Gastrointestinal:  Negative for abdominal pain, blood in stool, constipation and diarrhea.  Neurological:  Negative for dizziness and headaches.  Psychiatric/Behavioral:  Positive for dysphoric mood and sleep disturbance. Negative for self-injury and suicidal ideas. The patient is nervous/anxious.    Patient Active Problem List   Diagnosis Date Noted   Protein-calorie malnutrition,  moderate ([place]) 06/14/2020   Pleural effusion, left 06/14/2020   COPD mixed type ([place]) 06/14/2020   Aortic atherosclerosis ([place]) 01/06/2020   Disorder of thyroid 08/04/2019   Dizziness 07/29/2018   Imbalance 07/29/2018   Frequent falls 07/24/2018   Tremor, unspecified 06/25/2018   Special screening for malignant neoplasms, colon    Tobacco use disorder 05/29/2016   Migraine without aura and without status migrainosus, not intractable 03/28/2015   GERD without esophagitis 03/28/2015   Osteoporosis 03/28/2015   Insomnia 03/28/2015   Moderately severe recurrent major depression ([place]) 03/28/2015   Anxiety disorder 03/28/2015    Allergies  Allergen Reactions   Zoloft [Sertraline Hcl]     diaphoresis   Adhesive [Tape] Rash    Band-aids   Penicillins Rash    Past Surgical History:  Procedure Laterality Date   AUGMENTATION MAMMAPLASTY Bilateral [place]   CATARACT EXTRACTION  2002   COLONOSCOPY WITH PROPOFOL N/A 07/10/2017   Procedure: COLONOSCOPY WITH PROPOFOL;  Surgeon: Lin Landsman, MD;  Location: [facility];  Service: Endoscopy;  Laterality: N/A;   POLYPECTOMY N/A 07/10/2017   Procedure: POLYPECTOMY;  Surgeon: Lin Landsman, MD;  Location: [facility];  Service: Endoscopy;  Laterality: N/A;    Social History   Tobacco Use   Smoking status:  Every Day    Packs/day: 0.50    Years: 30.00    Pack years: 15.00    Types: Cigarettes   Smokeless tobacco: Never   Tobacco comments:    0.5PPD  10/25/2020  Vaping Use   Vaping Use: Never used  Substance Use Topics   Alcohol use: Yes    Alcohol/week: 4.0 standard drinks    Types: 4 Glasses of wine per week   Drug use: No     Medication list has been reviewed and updated.  Current Meds  Medication Sig   albuterol (VENTOLIN HFA) 108 (90 Base) MCG/ACT inhaler Inhale 2 puffs into the lungs every 6 (six) hours as needed for wheezing or shortness of breath.    Calcium-Magnesium-Vitamin D (CALCIUM 500 PO) Take 1 tablet by mouth daily.   cyclobenzaprine (FLEXERIL) 10 MG tablet Take 1 tablet (10 mg total) by mouth at bedtime. (Patient taking differently: Take 10 mg by mouth as needed.)   famotidine (PEPCID) 20 MG tablet Take 1 tablet (20 mg total) by mouth 2 (two) times daily. (Patient taking differently: Take 20 mg by mouth 2 (two) times daily. PRN only)   mirtazapine (REMERON) 45 MG tablet Take 45 mg by mouth at bedtime.   Multiple Vitamin (MULTI-DAY PO) Take 1 tablet by mouth daily.   SUMAtriptan (IMITREX) 100 MG tablet TAKE 1 TABLET DAILY AS     DIRECTED BY YOUR DOCTOR (Patient taking differently: as needed.)   Tiotropium Bromide Monohydrate (SPIRIVA RESPIMAT) 1.25 MCG/ACT AERS Inhale 2 puffs into the lungs daily. (Patient taking differently: Inhale 2 puffs into the lungs as needed.)   traZODone (DESYREL) 100 MG tablet Take 100 mg by mouth at bedtime.   venlafaxine XR (EFFEXOR-XR) 150 MG 24 hr capsule Take 150 mg by mouth every morning.   zolpidem (AMBIEN) 10 MG tablet Take 10 mg by mouth at bedtime as needed.    PHQ 2/9 Scores 02/02/2021 12/15/2020 10/07/2020 09/19/2020  PHQ - 2 Score '6 6 6 2  '$ PHQ- 9 Score '21 22 24 3    '$ GAD 7 : Generalized Anxiety Score 02/02/2021 12/15/2020 10/07/2020 09/19/2020  Nervous, Anxious, on Edge '3 3 3 2  '$ Control/stop worrying '3 3 3 2  '$ Worry too much - different things '3 3 3 2  '$ Trouble relaxing '3 3 3 1  '$ Restless '1 2 2 '$ 0  Easily annoyed or irritable '1 1 1 1  '$ Afraid - awful might happen '3 3 3 1  '$ Total GAD 7 Score '17 18 18 9  '$ Anxiety Difficulty - Extremely difficult Extremely difficult -    BP Readings from Last 3 Encounters:  02/02/21 102/78  12/15/20 138/82  10/25/20 92/62    Physical Exam Vitals and nursing note reviewed.  Constitutional:      General: She is not in acute distress.    Appearance: She is cachectic.  HENT:     Head: Normocephalic and atraumatic.  Pulmonary:     Effort: Pulmonary effort is normal.  No respiratory distress.  Skin:    General: Skin is warm and dry.     Findings: No rash.  Neurological:     Mental Status: She is alert and oriented to person, place, and time.  Psychiatric:        Attention and Perception: Attention normal.        Mood and Affect: Mood is depressed.        Behavior: Behavior normal.    Wt Readings from Last 3 Encounters:  02/02/21 84 lb (  38.1 kg)  12/15/20 88 lb 14.4 oz (40.3 kg)  10/25/20 91 lb 6.4 oz (41.5 kg)    BP 102/78   Pulse 98   Temp 97.9 F (36.6 C) (Oral)   Ht '5\' 7"'$  (1.702 m)   Wt 84 lb (38.1 kg)   SpO2 93%   BMI 13.16 kg/m   Assessment and Plan: 1. Protein-calorie malnutrition, moderate ([place]) She is has been unable to consume enough calories to prevent ongoing weight loss. There is no evidence of neoplastic process - she is up to date on recommended screenings. She may benefit from Marinol but insurance is unlikely to approve it.  We discussed a trial of otc CBD products to help with appetite and anxiety and she is interested.  An outlet such as [employer] is more likely to have knowledgeable sales staff to help with recommendations.  2. Moderately severe recurrent major depression ([place]) Continue follow up with Psych Continue current medications as prescribed   Partially dictated using [place]. Any errors are unintentional.  Halina Maidens, MD [facility]  02/02/2021

## 2021-02-02 NOTE — Patient Instructions (Signed)
Visit [facility] in [place]

## 2021-03-06 ENCOUNTER — Telehealth: Payer: Self-pay

## 2021-03-06 NOTE — Telephone Encounter (Signed)
Copied from [place] [id number]. Topic: General - Call Back - No Documentation >> Mar 06, 2021  3:32 PM Erick Blinks wrote: Reason for CRM: Pt called to share that the recent product she discussed with her PCP is not working. Pt also declined to share the name of this "product." She also wants to discuss when she should receive her booster/flu shot with a nurse.  Best contact: [phone]

## 2021-03-07 NOTE — Telephone Encounter (Signed)
Delta 8 is not helping patient for any of her symptoms. Sent patient a my chart message with instructions from Dr. Army Melia.

## 2021-03-08 ENCOUNTER — Ambulatory Visit (INDEPENDENT_AMBULATORY_CARE_PROVIDER_SITE_OTHER): Payer: Medicare Other

## 2021-03-08 ENCOUNTER — Other Ambulatory Visit: Payer: Self-pay

## 2021-03-08 DIAGNOSIS — Z23 Encounter for immunization: Secondary | ICD-10-CM

## 2021-03-22 ENCOUNTER — Ambulatory Visit: Payer: Medicare Other

## 2021-05-15 ENCOUNTER — Telehealth: Payer: Self-pay | Admitting: Internal Medicine

## 2021-05-15 NOTE — Telephone Encounter (Signed)
Copied from [place] [phone]. Topic: Medicare AWV >> May 15, 2021 10:32 AM Cher Nakai R wrote: Reason for CRM:  Left message for patient to call back and schedule Medicare Annual Wellness Visit (AWV) in office.   If unable to come into the office for AWV,  please offer to do virtually or by telephone.  Last AWV: 03/21/2020  Please schedule at anytime with [facility] Health Advisor.      40 Minutes appointment   Any questions, please call me at [phone]

## 2021-05-22 DIAGNOSIS — F332 Major depressive disorder, recurrent severe without psychotic features: Secondary | ICD-10-CM | POA: Diagnosis not present

## 2021-06-01 ENCOUNTER — Telehealth: Payer: Self-pay | Admitting: Internal Medicine

## 2021-06-01 NOTE — Telephone Encounter (Signed)
Copied from [place] [id number]. Topic: Medicare AWV >> Jun 01, 2021  9:52 AM Cher Nakai R wrote: Reason for CRM:  Left message for patient to call back and schedule Medicare Annual Wellness Visit (AWV) in office.   If unable to come into the office for AWV,  please offer to do virtually or by telephone.  Last AWV:  03/21/2020  Please schedule at anytime with [facility] Health Advisor.      40 Minutes appointment   Any questions, please call me at [phone]

## 2021-07-05 ENCOUNTER — Ambulatory Visit (INDEPENDENT_AMBULATORY_CARE_PROVIDER_SITE_OTHER): Payer: Medicare Other

## 2021-07-05 ENCOUNTER — Other Ambulatory Visit: Payer: Self-pay

## 2021-07-05 DIAGNOSIS — Z0000 Encounter for general adult medical examination without abnormal findings: Secondary | ICD-10-CM | POA: Diagnosis not present

## 2021-07-05 DIAGNOSIS — Z122 Encounter for screening for malignant neoplasm of respiratory organs: Secondary | ICD-10-CM

## 2021-07-05 DIAGNOSIS — Z780 Asymptomatic menopausal state: Secondary | ICD-10-CM | POA: Diagnosis not present

## 2021-07-05 DIAGNOSIS — Z1231 Encounter for screening mammogram for malignant neoplasm of breast: Secondary | ICD-10-CM

## 2021-07-05 MED ORDER — SUMATRIPTAN SUCCINATE 100 MG PO TABS: 100.0000 mg | ORAL_TABLET | ORAL | 2 refills | Status: DC | PRN

## 2021-07-05 NOTE — Patient Instructions (Signed)
Carolyn Dunlap , Thank you for taking time to come for your Medicare Wellness Visit. I appreciate your ongoing commitment to your health goals. Please review the following plan we discussed and let me know if I can assist you in the future.   Screening recommendations/referrals: Colonoscopy: done 07/10/17. Repeat 06/2022 Mammogram: done 05/06/19. Please call [phone] to schedule your mammogram and bone density screening.  Bone Density: done 05/06/19 Recommended yearly ophthalmology/optometry visit for glaucoma screening and checkup Recommended yearly dental visit for hygiene and checkup  Vaccinations: Influenza vaccine: done 03/08/21 Pneumococcal vaccine: done 05/29/16 Tdap vaccine: done 06/10/18 Shingles vaccine: done 06/10/18 & 06/25/17   Covid-19:done 07/29/19, 08/19/19, 03/11/20 7 11/03/20  Advanced directives: Please bring a copy of your health care power of attorney and living will to the office at your convenience.   Conditions/risks identified: If you wish to quit smoking, help is available. For free tobacco cessation program offerings call the [facility] at [phone] or Live Well Line at [phone]. You may also visit [email or web address] or email [email or web address] for more information on other programs.   Next appointment: Follow up in one year for your annual wellness visit    Preventive Care 65 Years and Older, Female Preventive care refers to lifestyle choices and visits with your health care provider that can promote health and wellness. What does preventive care include? A yearly physical exam. This is also called an annual well check. Dental exams once or twice a year. Routine eye exams. Ask your health care provider how often you should have your eyes checked. Personal lifestyle choices, including: Daily care of your teeth and gums. Regular physical activity. Eating a healthy diet. Avoiding tobacco and drug use. Limiting alcohol  use. Practicing safe sex. Taking low-dose aspirin every day. Taking vitamin and mineral supplements as recommended by your health care provider. What happens during an annual well check? The services and screenings done by your health care provider during your annual well check will depend on your age, overall health, lifestyle risk factors, and family history of disease. Counseling  Your health care provider may ask you questions about your: Alcohol use. Tobacco use. Drug use. Emotional well-being. Home and relationship well-being. Sexual activity. Eating habits. History of falls. Memory and ability to understand (cognition). Work and work Statistician. Reproductive health. Screening  You may have the following tests or measurements: Height, weight, and BMI. Blood pressure. Lipid and cholesterol levels. These may be checked every 5 years, or more frequently if you are over 49 years old. Skin check. Lung cancer screening. You may have this screening every year starting at age 52 if you have a 30-pack-year history of smoking and currently smoke or have quit within the past 15 years. Fecal occult blood test (FOBT) of the stool. You may have this test every year starting at age 70. Flexible sigmoidoscopy or colonoscopy. You may have a sigmoidoscopy every 5 years or a colonoscopy every 10 years starting at age 64. Hepatitis C blood test. Hepatitis B blood test. Sexually transmitted disease (STD) testing. Diabetes screening. This is done by checking your blood sugar (glucose) after you have not eaten for a while (fasting). You may have this done every 1-3 years. Bone density scan. This is done to screen for osteoporosis. You may have this done starting at age 67. Mammogram. This may be done every 1-2 years. Talk to your health care provider about how often you should have regular mammograms. Talk with your health care provider about  your test results, treatment options, and if necessary,  the need for more tests. Vaccines  Your health care provider may recommend certain vaccines, such as: Influenza vaccine. This is recommended every year. Tetanus, diphtheria, and acellular pertussis (Tdap, Td) vaccine. You may need a Td booster every 10 years. Zoster vaccine. You may need this after age 66. Pneumococcal 13-valent conjugate (PCV13) vaccine. One dose is recommended after age 49. Pneumococcal polysaccharide (PPSV23) vaccine. One dose is recommended after age 62. Talk to your health care provider about which screenings and vaccines you need and how often you need them. This information is not intended to replace advice given to you by your health care provider. Make sure you discuss any questions you have with your health care provider. Document Released: 06/24/2015 Document Revised: 02/15/2016 Document Reviewed: 03/29/2015 Elsevier Interactive Patient Education  2017 [place] Prevention in the Home Falls can cause injuries. They can happen to people of all ages. There are many things you can do to make your home safe and to help prevent falls. What can I do on the outside of my home? Regularly fix the edges of walkways and driveways and fix any cracks. Remove anything that might make you trip as you walk through a door, such as a raised step or threshold. Trim any bushes or trees on the path to your home. Use bright outdoor lighting. Clear any walking paths of anything that might make someone trip, such as rocks or tools. Regularly check to see if handrails are loose or broken. Make sure that both sides of any steps have handrails. Any raised decks and porches should have guardrails on the edges. Have any leaves, snow, or ice cleared regularly. Use sand or salt on walking paths during winter. Clean up any spills in your garage right away. This includes oil or grease spills. What can I do in the bathroom? Use night lights. Install grab bars by the toilet and in the  tub and shower. Do not use towel bars as grab bars. Use non-skid mats or decals in the tub or shower. If you need to sit down in the shower, use a plastic, non-slip stool. Keep the floor dry. Clean up any water that spills on the floor as soon as it happens. Remove soap buildup in the tub or shower regularly. Attach bath mats securely with double-sided non-slip rug tape. Do not have throw rugs and other things on the floor that can make you trip. What can I do in the bedroom? Use night lights. Make sure that you have a light by your bed that is easy to reach. Do not use any sheets or blankets that are too big for your bed. They should not hang down onto the floor. Have a firm chair that has side arms. You can use this for support while you get dressed. Do not have throw rugs and other things on the floor that can make you trip. What can I do in the kitchen? Clean up any spills right away. Avoid walking on wet floors. Keep items that you use a lot in easy-to-reach places. If you need to reach something above you, use a strong step stool that has a grab bar. Keep electrical cords out of the way. Do not use floor polish or wax that makes floors slippery. If you must use wax, use non-skid floor wax. Do not have throw rugs and other things on the floor that can make you trip. What can I do with  my stairs? Do not leave any items on the stairs. Make sure that there are handrails on both sides of the stairs and use them. Fix handrails that are broken or loose. Make sure that handrails are as long as the stairways. Check any carpeting to make sure that it is firmly attached to the stairs. Fix any carpet that is loose or worn. Avoid having throw rugs at the top or bottom of the stairs. If you do have throw rugs, attach them to the floor with carpet tape. Make sure that you have a light switch at the top of the stairs and the bottom of the stairs. If you do not have them, ask someone to add them for  you. What else can I do to help prevent falls? Wear shoes that: Do not have high heels. Have rubber bottoms. Are comfortable and fit you well. Are closed at the toe. Do not wear sandals. If you use a stepladder: Make sure that it is fully opened. Do not climb a closed stepladder. Make sure that both sides of the stepladder are locked into place. Ask someone to hold it for you, if possible. Clearly mark and make sure that you can see: Any grab bars or handrails. First and last steps. Where the edge of each step is. Use tools that help you move around (mobility aids) if they are needed. These include: Canes. Walkers. Scooters. Crutches. Turn on the lights when you go into a dark area. Replace any light bulbs as soon as they burn out. Set up your furniture so you have a clear path. Avoid moving your furniture around. If any of your floors are uneven, fix them. If there are any pets around you, be aware of where they are. Review your medicines with your doctor. Some medicines can make you feel dizzy. This can increase your chance of falling. Ask your doctor what other things that you can do to help prevent falls. This information is not intended to replace advice given to you by your health care provider. Make sure you discuss any questions you have with your health care provider. Document Released: 03/24/2009 Document Revised: 11/03/2015 Document Reviewed: 07/02/2014 Elsevier Interactive Patient Education  2017 [employer].

## 2021-07-05 NOTE — Progress Notes (Signed)
Subjective:   Carolyn Dunlap is a 71 y.o. female who presents for Medicare Annual (Subsequent) preventive examination.  Virtual Visit via Telephone Note  I connected with  [name] on 07/05/21 at  2:00 PM EST by telephone and verified that I am speaking with the correct person using two identifiers.  Location: Patient: home Provider: [facility] Persons participating in the virtual visit: [place]   I discussed the limitations, risks, security and privacy concerns of performing an evaluation and management service by telephone and the availability of in person appointments. The patient expressed understanding and agreed to proceed.  Interactive audio and video telecommunications were attempted between this nurse and patient, however failed, due to patient having technical difficulties OR patient did not have access to video capability.  We continued and completed visit with audio only.  Some vital signs may be absent or patient reported.   Clemetine Marker, LPN   Review of Systems     Cardiac Risk Factors include: advanced age (>33men, >53 women);smoking/ tobacco exposure     Objective:    There were no vitals filed for this visit. There is no height or weight on file to calculate BMI.  Advanced Directives 07/05/2021 03/21/2020 03/18/2019 07/10/2017 05/29/2016 08/09/2015 08/09/2015  Does Patient Have a Medical Advance Directive? Yes Yes Yes Yes No;Yes Yes No  Type of Paramedic of [place];Living will [place];Living will [place];Living will [place];Living will Living will Living will -  Does patient want to make changes to medical advance directive? - - - No - Patient declined - - -  Copy of [place] in Chart? No - copy requested No - copy requested No - copy requested No - copy requested - - -  Would patient like information on creating a medical advance  directive? - - - - - - No - patient declined information    Current Medications (verified) Outpatient Encounter Medications as of 07/05/2021  Medication Sig   albuterol (VENTOLIN HFA) 108 (90 Base) MCG/ACT inhaler Inhale 2 puffs into the lungs every 6 (six) hours as needed for wheezing or shortness of breath.   Calcium-Magnesium-Vitamin D (CALCIUM 500 PO) Take 1 tablet by mouth daily.   cyclobenzaprine (FLEXERIL) 10 MG tablet Take 1 tablet (10 mg total) by mouth at bedtime. (Patient taking differently: Take 10 mg by mouth as needed.)   mirtazapine (REMERON) 45 MG tablet Take 45 mg by mouth at bedtime.   Multiple Vitamin (MULTI-DAY PO) Take 1 tablet by mouth daily.   Tiotropium Bromide Monohydrate (SPIRIVA RESPIMAT) 1.25 MCG/ACT AERS Inhale 2 puffs into the lungs daily.   traZODone (DESYREL) 100 MG tablet Take 100 mg by mouth at bedtime.   Venlafaxine HCl 225 MG TB24 Take 1 tablet by mouth every morning.   zolpidem (AMBIEN) 10 MG tablet Take 10 mg by mouth at bedtime as needed.   [DISCONTINUED] SUMAtriptan (IMITREX) 100 MG tablet TAKE 1 TABLET DAILY AS     DIRECTED BY YOUR DOCTOR (Patient taking differently: as needed.)   [DISCONTINUED] famotidine (PEPCID) 20 MG tablet Take 1 tablet (20 mg total) by mouth 2 (two) times daily. (Patient taking differently: Take 20 mg by mouth 2 (two) times daily. PRN only)   [DISCONTINUED] venlafaxine XR (EFFEXOR-XR) 150 MG 24 hr capsule Take 150 mg by mouth every morning.   No facility-administered encounter medications on file as of 07/05/2021.    Allergies (verified) Zoloft [place] Nickel  hcl], Adhesive [tape], and Penicillins   History: Past Medical History:  Diagnosis Date   Depression    GERD (gastroesophageal reflux disease)    Insomnia    Migraine headache    weekly   PONV (postoperative nausea and vomiting)    in distant past   Past Surgical History:  Procedure Laterality Date   AUGMENTATION MAMMAPLASTY Bilateral [place]   CATARACT EXTRACTION  2002   COLONOSCOPY WITH PROPOFOL N/A 07/10/2017   Procedure: COLONOSCOPY WITH PROPOFOL;  Surgeon: Lin Landsman, MD;  Location: [facility];  Service: Endoscopy;  Laterality: N/A;   POLYPECTOMY N/A 07/10/2017   Procedure: POLYPECTOMY;  Surgeon: Lin Landsman, MD;  Location: [facility];  Service: Endoscopy;  Laterality: N/A;   Family History  Problem Relation Age of Onset   Hyperlipidemia Mother    Hypertension Mother    Hypertension Father    Breast cancer Neg Hx    Social History   Socioeconomic History   Marital status: Divorced    Spouse name: Not on file   Number of children: 1   Years of education: 12   Highest education level: High school graduate  Occupational History   Occupation: Retired  Tobacco Use   Smoking status: Every Day    Packs/day: 0.50    Years: 30.00    Pack years: 15.00    Types: Cigarettes   Smokeless tobacco: Never   Tobacco comments:    0.5PPD  10/25/2020  Vaping Use   Vaping Use: Never used  Substance and Sexual Activity   Alcohol use: Not Currently   Drug use: No   Sexual activity: Not Currently  Other Topics Concern   Not on file  Social History Narrative   Pt lives with ex husband   Social Determinants of Health   Financial Resource Strain: Low Risk    Difficulty of Paying Living Expenses: Not hard at all  Food Insecurity: No Food Insecurity   Worried About Charity fundraiser in the Last Year: Never true   Arboriculturist in the Last Year: Never true  Transportation Needs: No Transportation Needs   Lack of Transportation (Medical): No   Lack of Transportation (Non-Medical): No  Physical Activity: Inactive   Days of Exercise per Week: 0 days   Minutes of Exercise per Session: 0 min  Stress: No Stress Concern Present   Feeling of Stress : Not at all  Social Connections: Moderately Isolated   Frequency of Communication with Friends and Family: More than three times a  week   Frequency of Social Gatherings with Friends and Family: Three times a week   Attends Religious Services: Never   Active Member of Clubs or Organizations: No   Attends Archivist Meetings: Never   Marital Status: Living with partner    Tobacco Counseling Ready to quit: Yes Counseling given: Yes Tobacco comments: 0.5PPD  10/25/2020   Clinical Intake:  Pre-visit preparation completed: Yes  Pain : No/denies pain     Nutritional Risks: None Diabetes: No  How often do you need to have someone help you when you read instructions, pamphlets, or other written materials from your doctor or pharmacy?: 1 - Never    Interpreter Needed?: No  Information entered by :: Clemetine Marker LPN   Activities of Daily Living In your present state of health, do you have any difficulty performing the following activities: 07/05/2021 02/02/2021  Hearing? N N  Vision? N N  Difficulty concentrating or making decisions? N Y  Walking or climbing stairs? N Y  Dressing or bathing? N N  Doing errands, shopping? N Y  Conservation officer, nature and eating ? N -  Using the Toilet? N -  In the past six months, have you accidently leaked urine? N -  Do you have problems with loss of bowel control? N -  Managing your Medications? N -  Managing your Finances? N -  Housekeeping or managing your Housekeeping? N -  Some recent data might be hidden    Patient Care Team: Glean Hess, MD as PCP - General (Internal Medicine) Ulyess Blossom (Psychiatry) Brand Males, MD as Consulting Physician (Pulmonary Disease)  Indicate any recent Medical Services you may have received from other than [facility] providers in the past year (date may be approximate).     Assessment:   This is a routine wellness examination for [place].  Hearing/Vision screen Hearing Screening - Comments:: Pt denies hearing difficulty Vision Screening - Comments:: Vision screenings done at [facility]  Dietary  issues and exercise activities discussed: Current Exercise Habits: The patient does not participate in regular exercise at present, Exercise limited by: None identified   Goals Addressed             This Visit's Progress    Quit Smoking   Not on track    If you wish to quit smoking, help is available. For free tobacco cessation program offerings call the [facility] at [phone] or Live Well Line at [phone]. You may also visit [email or web address] or email [email or web address] for more information on other programs.         Depression Screen PHQ 2/9 Scores 07/05/2021 02/02/2021 12/15/2020 10/07/2020 09/19/2020 08/03/2020 03/21/2020  PHQ - 2 Score 6 6 6 6 2 4 6   PHQ- 9 Score 18 21 22 24 3 16 22     Fall Risk Fall Risk  07/05/2021 02/02/2021 12/15/2020 10/07/2020 09/19/2020  Falls in the past year? 0 0 0 0 0  Number falls in past yr: 0 0 0 0 -  Injury with Fall? 0 0 0 0 -  Risk for fall due to : No Fall Risks No Fall Risks - No Fall Risks -  Risk for fall due to: Comment - - - - -  Follow up Falls prevention discussed Falls evaluation completed Falls evaluation completed Falls evaluation completed Falls evaluation completed    [place]:  Any stairs in or around the home? No  If so, are there any without handrails? No  Home free of loose throw rugs in walkways, pet beds, electrical cords, etc? Yes  Adequate lighting in your home to reduce risk of falls? Yes   ASSISTIVE DEVICES UTILIZED TO PREVENT FALLS:  Life alert? No  Use of a cane, walker or w/c? No  Grab bars in the bathroom? Yes  Shower chair or bench in shower? Yes  Elevated toilet seat or a handicapped toilet? No   TIMED UP AND GO:  Was the test performed? No . Telephonic visit  Cognitive Function: Normal cognitive status assessed by direct observation by this Nurse Health Advisor. No abnormalities found.       6CIT Screen 03/21/2020 03/18/2019  What Year?  0 points 0 points  What month? 0 points 0 points  What time? 0 points 0 points  Count back from 20 0 points 0 points  Months in reverse  0 points 0 points  Repeat phrase 2 points 0 points  Total Score 2 0    Immunizations Immunization History  Administered Date(s) Administered   Fluad Quad(high Dose 65+) 03/08/2021   Influenza,inj,quad, With Preservative 04/04/2018   Influenza-Unspecified 04/08/2017, 03/12/2019, 03/22/2020   PFIZER(Purple Top)SARS-COV-2 Vaccination 07/29/2019, 08/19/2019, 03/11/2020, 11/03/2020   Pneumococcal Conjugate-13 05/26/2015   Pneumococcal Polysaccharide-23 05/29/2016   Zoster Recombinat (Shingrix) 06/25/2017, 06/10/2018   Zoster, Live 02/09/2011    TDAP status: Up to date  Flu Vaccine status: Up to date  Pneumococcal vaccine status: Up to date  Covid-19 vaccine status: Completed vaccines  Qualifies for Shingles Vaccine? Yes   Zostavax completed Yes   Shingrix Completed?: Yes  Screening Tests Health Maintenance  Topic Date Due   MAMMOGRAM  05/05/2020   COVID-19 Vaccine (5 - Booster for Pfizer series) 12/29/2020   Pneumonia Vaccine 57+ Years old (3 - PPSV23 if available, else PCV20) 05/29/2021   TETANUS/TDAP  08/03/2021 (Originally 06/15/1969)   COLONOSCOPY (Pts 45-44yrs Insurance coverage will need to be confirmed)  07/10/2022   INFLUENZA VACCINE  Completed   DEXA SCAN  Completed   Hepatitis C Screening  Completed   Zoster Vaccines- Shingrix  Completed   HPV VACCINES  Aged Out    Health Maintenance  Health Maintenance Due  Topic Date Due   MAMMOGRAM  05/05/2020   COVID-19 Vaccine (5 - Booster for Pfizer series) 12/29/2020   Pneumonia Vaccine 70+ Years old (3 - PPSV23 if available, else PCV20) 05/29/2021    Colorectal cancer screening: Type of screening: Colonoscopy. Completed 07/10/17. Repeat every 5 years  Mammogram status: Completed 05/06/19. Repeat every year  Bone Density status: Completed 05/06/19. Results reflect: Bone density  results: OSTEOPOROSIS. Repeat every 2 years.  Lung Cancer Screening: (Low Dose CT Chest recommended if Age 54-80 years, 30 pack-year currently smoking OR have quit w/in 15years.) does qualify.   Lung Cancer Screening Referral: A referral has been sent to [facility] Lung Cancer Screening regarding the possible need for this exam. The patient's chart will be reviewed to determine if they qualify and the patient will be contacted to facilitate the scheduling of the Low Dose Chest CT for lung cancer screening.    Additional Screening:  Hepatitis C Screening: does qualify; Completed 05/26/15  Vision Screening: Recommended annual ophthalmology exams for early detection of glaucoma and other disorders of the eye. Is the patient up to date with their annual eye exam?  Yes  Who is the provider or what is the name of the office in which the patient attends annual eye exams? [facility].   Dental Screening: Recommended annual dental exams for proper oral hygiene  Community Resource Referral / Chronic Care Management: CRR required this visit?  No   CCM required this visit?  No      Plan:     I have personally reviewed and noted the following in the patients chart:   Medical and social history Use of alcohol, tobacco or illicit drugs  Current medications and supplements including opioid prescriptions.  Functional ability and status Nutritional status Physical activity Advanced directives List of other physicians Hospitalizations, surgeries, and ER visits in previous 12 months Vitals Screenings to include cognitive, depression, and falls Referrals and appointments  In addition, I have reviewed and discussed with patient certain preventive protocols, quality metrics, and best practice recommendations. A written personalized care plan for preventive services as well as general preventive health recommendations were provided to patient.   Due to  this being a telephonic  visit, the after visit summary with patients personalized plan was offered to patient via my-chart.   Clemetine Marker, LPN   [id number]   Nurse Notes: pt requesting refill of Imitrex to be sent to [facility]. Last rx was sent to [facility].

## 2021-07-28 ENCOUNTER — Telehealth: Payer: Self-pay | Admitting: Acute Care

## 2021-07-28 NOTE — Telephone Encounter (Signed)
Left message for pt to call back to schedule f/u lung screening CT scan.  ?

## 2021-08-14 DIAGNOSIS — F418 Other specified anxiety disorders: Secondary | ICD-10-CM | POA: Diagnosis not present

## 2021-08-14 DIAGNOSIS — F332 Major depressive disorder, recurrent severe without psychotic features: Secondary | ICD-10-CM | POA: Diagnosis not present

## 2021-08-17 ENCOUNTER — Telehealth: Payer: Self-pay

## 2021-08-17 NOTE — Telephone Encounter (Signed)
Called left VM as a reminder to call and schedule bone density. [phone] ? ?KP ?

## 2021-09-25 DIAGNOSIS — F418 Other specified anxiety disorders: Secondary | ICD-10-CM | POA: Diagnosis not present

## 2021-09-25 DIAGNOSIS — F332 Major depressive disorder, recurrent severe without psychotic features: Secondary | ICD-10-CM | POA: Diagnosis not present

## 2021-10-10 ENCOUNTER — Encounter: Payer: Self-pay | Admitting: Internal Medicine

## 2021-10-10 ENCOUNTER — Ambulatory Visit (INDEPENDENT_AMBULATORY_CARE_PROVIDER_SITE_OTHER): Payer: Medicare Other | Admitting: Internal Medicine

## 2021-10-10 VITALS — BP 108/70 | HR 72 | Ht 67.0 in | Wt 87.2 lb

## 2021-10-10 DIAGNOSIS — J449 Chronic obstructive pulmonary disease, unspecified: Secondary | ICD-10-CM

## 2021-10-10 DIAGNOSIS — E079 Disorder of thyroid, unspecified: Secondary | ICD-10-CM | POA: Diagnosis not present

## 2021-10-10 DIAGNOSIS — E440 Moderate protein-calorie malnutrition: Secondary | ICD-10-CM | POA: Diagnosis not present

## 2021-10-10 DIAGNOSIS — Z1231 Encounter for screening mammogram for malignant neoplasm of breast: Secondary | ICD-10-CM

## 2021-10-10 DIAGNOSIS — G43009 Migraine without aura, not intractable, without status migrainosus: Secondary | ICD-10-CM | POA: Diagnosis not present

## 2021-10-10 DIAGNOSIS — I700 Atherosclerosis of aorta: Secondary | ICD-10-CM | POA: Diagnosis not present

## 2021-10-10 DIAGNOSIS — F332 Major depressive disorder, recurrent severe without psychotic features: Secondary | ICD-10-CM

## 2021-10-10 MED ORDER — SUMATRIPTAN SUCCINATE 100 MG PO TABS: 100.0000 mg | ORAL_TABLET | ORAL | 2 refills | Status: DC | PRN

## 2021-10-10 NOTE — Patient Instructions (Addendum)
Breztri inhaler - 1 or 2 inhalations twice a day in place of Spriiva ? ?Call the lung cancer screening program at [facility]  [phone]  Doroteo Glassman ? ?Call the number for mammograms and add it on or schedule it separately if needed. ?

## 2021-10-10 NOTE — Progress Notes (Signed)
? ? ?Date:  10/10/2021  ? ?Name:  KRYSTLE POLCYN   DOB:  January 29, 1951   MRN:  [id number] ? ? ?Chief Complaint: Annual Exam (Breast exam no pap ) ?ALLESSANDRA BERNARDI is a 71 y.o. female who presents today for her Complete Annual Exam. She feels well. She reports exercising- none. She reports she is sleeping fairly well. Breast complaints none. ? ?Mammogram: 04/2019 ?DEXA: 04/2019 scheduled 10/19/21 ?Pap smear: discontinued ?Colonoscopy: 06/2017 repeat 5 yrs ? ?SDOH Interventions   ? ?Flowsheet Row Most Recent Value  ?SDOH Interventions   ?Depression Interventions/Treatment  Medication  ? ?  ?  ? ?Health Maintenance Due  ?Topic Date Due  ? MAMMOGRAM  05/05/2020  ? COVID-19 Vaccine (6 - Booster for Pfizer series) 10/13/2021  ?  ?Immunization History  ?Administered Date(s) Administered  ? Fluad Quad(high Dose 65+) 03/08/2021  ? Influenza,inj,quad, With Preservative 04/04/2018  ? Influenza-Unspecified 04/08/2017, 03/12/2019, 08/18/2021  ? PFIZER(Purple Top)SARS-COV-2 Vaccination 07/29/2019, 08/19/2019, 03/11/2020, 11/03/2020, 08/18/2021  ? [place] Pediatric Vaccine(88mo to <545yr 08/18/2021  ? Pneumococcal Conjugate-13 05/26/2015  ? Pneumococcal Polysaccharide-23 05/29/2016, 08/18/2021  ? Tdap 08/18/2021  ? Zoster Recombinat (Shingrix) 06/25/2017, 06/10/2018  ? Zoster, Live 02/09/2011  ? ? ?Migraine  ?This is a recurrent problem. The pain quality is similar to prior headaches. Pertinent negatives include no abdominal pain, coughing, dizziness, fever, hearing loss, tinnitus or vomiting. She has tried triptans for the symptoms.  ?Depression ?       This is a chronic problem.The problem is unchanged (followed by Psych).  Associated symptoms include fatigue, appetite change and headaches. ?COPD - she continues to smoke.  She is due for LDCT screening.  Seen by Pulmonary using [facility] as prescribed.  Rare use of albuterol.  She is very limited in activity due to shortness of breath. ? ?Lab Results  ?Component Value Date  ?  NA 138 10/07/2020  ? K 3.9 10/07/2020  ? CO2 23 10/07/2020  ? GLUCOSE 112 (H) 10/07/2020  ? BUN 8 10/07/2020  ? CREATININE 0.65 10/07/2020  ? CALCIUM 9.2 10/07/2020  ? EGFR 96 10/07/2020  ? GFRNONAA 90 01/06/2020  ? ?Lab Results  ?Component Value Date  ? CHOL 186 10/07/2020  ? HDL 71 10/07/2020  ? LDLCALC 101 (H) 10/07/2020  ? TRIG 78 10/07/2020  ? CHOLHDL 2.6 10/07/2020  ? ?Lab Results  ?Component Value Date  ? TSH 4.200 10/07/2020  ? ?Lab Results  ?Component Value Date  ? HGBA1C 5.4 05/26/2015  ? ?Lab Results  ?Component Value Date  ? WBC 9.6 10/07/2020  ? HGB 11.6 10/07/2020  ? HCT 34.2 10/07/2020  ? MCV 83 10/07/2020  ? PLT 291 10/07/2020  ? ?Lab Results  ?Component Value Date  ? ALT 17 10/07/2020  ? AST 17 10/07/2020  ? ALKPHOS 204 (H) 10/07/2020  ? BILITOT 0.3 10/07/2020  ? ?No results found for: 25OHVITD2, 25GravetteVD25OH  ? ?Review of Systems  ?Constitutional:  Positive for appetite change and fatigue. Negative for chills and fever.  ?HENT:  Negative for congestion, hearing loss, tinnitus, trouble swallowing and voice change.   ?Eyes:  Negative for visual disturbance.  ?Respiratory:  Negative for cough, chest tightness, shortness of breath and wheezing.   ?Cardiovascular:  Negative for chest pain, palpitations and leg swelling.  ?Gastrointestinal:  Negative for abdominal pain, constipation, diarrhea and vomiting.  ?Endocrine: Negative for polydipsia and polyuria.  ?Genitourinary:  Negative for dysuria, frequency, genital sores, vaginal bleeding and vaginal discharge.  ?Musculoskeletal:  Negative for  arthralgias, gait problem and joint swelling.  ?Skin:  Negative for color change and rash.  ?Neurological:  Positive for headaches. Negative for dizziness, tremors and light-headedness.  ?Hematological:  Negative for adenopathy. Does not bruise/bleed easily.  ?Psychiatric/Behavioral:  Positive for depression, dysphoric mood and sleep disturbance. The patient is nervous/anxious.   ? ?Patient Active Problem List   ? Diagnosis Date Noted  ? Protein-calorie malnutrition, moderate ([place]) 06/14/2020  ? Pleural effusion, left 06/14/2020  ? COPD mixed type ([place]) 06/14/2020  ? Aortic atherosclerosis ([place]) 01/06/2020  ? Disorder of thyroid 08/04/2019  ? Dizziness 07/29/2018  ? Imbalance 07/29/2018  ? Frequent falls 07/24/2018  ? Tremor, unspecified 06/25/2018  ? Special screening for malignant neoplasms, colon   ? Tobacco use disorder 05/29/2016  ? Migraine without aura and without status migrainosus, not intractable 03/28/2015  ? GERD without esophagitis 03/28/2015  ? Osteoporosis 03/28/2015  ? Insomnia 03/28/2015  ? Moderately severe recurrent major depression ([place]) 03/28/2015  ? Anxiety disorder 03/28/2015  ? ? ?Allergies  ?Allergen Reactions  ? Zoloft [Sertraline Hcl]   ?  diaphoresis  ? Adhesive [Tape] Rash  ?  Band-aids  ? Penicillins Rash  ? ? ?Past Surgical History:  ?Procedure Laterality Date  ? AUGMENTATION MAMMAPLASTY Bilateral 1976  ? [place]  ? CATARACT EXTRACTION  2002  ? COLONOSCOPY WITH PROPOFOL N/A 07/10/2017  ? Procedure: COLONOSCOPY WITH PROPOFOL;  Surgeon: Lin Landsman, MD;  Location: [facility];  Service: Endoscopy;  Laterality: N/A;  ? POLYPECTOMY N/A 07/10/2017  ? Procedure: POLYPECTOMY;  Surgeon: Lin Landsman, MD;  Location: [facility];  Service: Endoscopy;  Laterality: N/A;  ? ? ?Social History  ? ?Tobacco Use  ? Smoking status: Every Day  ?  Packs/day: 0.50  ?  Years: 30.00  ?  Pack years: 15.00  ?  Types: Cigarettes  ? Smokeless tobacco: Never  ? Tobacco comments:  ?  0.5PPD  10/25/2020  ?Vaping Use  ? Vaping Use: Never used  ?Substance Use Topics  ? Alcohol use: Not Currently  ? Drug use: No  ? ? ? ?Medication list has been reviewed and updated. ? ?Current Meds  ?Medication Sig  ? albuterol (VENTOLIN HFA) 108 (90 Base) MCG/ACT inhaler Inhale 2 puffs into the lungs every 6 (six) hours as needed for wheezing or shortness of breath.  ?  Calcium-Magnesium-Vitamin D (CALCIUM 500 PO) Take 1 tablet by mouth daily.  ? cyclobenzaprine (FLEXERIL) 10 MG tablet Take 1 tablet (10 mg total) by mouth at bedtime. (Patient taking differently: Take 10 mg by mouth as needed.)  ? mirtazapine (REMERON) 45 MG tablet Take 45 mg by mouth at bedtime.  ? Multiple Vitamin (MULTI-DAY PO) Take 1 tablet by mouth daily.  ? OLANZapine (ZYPREXA) 10 MG tablet Take 10 mg by mouth at bedtime.  ? SUMAtriptan (IMITREX) 100 MG tablet Take 1 tablet (100 mg total) by mouth as needed. May repeat in 2 hours if headache persists or recurs.  ? Tiotropium Bromide Monohydrate (SPIRIVA RESPIMAT) 1.25 MCG/ACT AERS Inhale 2 puffs into the lungs daily.  ? traZODone (DESYREL) 100 MG tablet Take 100 mg by mouth at bedtime.  ? Venlafaxine HCl 225 MG TB24 Take 1 tablet by mouth every morning.  ? zolpidem (AMBIEN) 10 MG tablet Take 10 mg by mouth at bedtime as needed.  ? ? ? ?  10/10/2021  ? 10:00 AM 02/02/2021  ? 10:47 AM 12/15/2020  ? 10:29 AM 10/07/2020  ? 10:14 AM  ?  GAD 7 : Generalized Anxiety Score  ?Nervous, Anxious, on Edge 3 3 3 3   ?Control/stop worrying 3 3 3 3   ?Worry too much - different things 3 3 3 3   ?Trouble relaxing 3 3 3 3   ?Restless 1 1 2 2   ?Easily annoyed or irritable 0 1 1 1   ?Afraid - awful might happen 3 3 3 3   ?Total GAD 7 Score 16 17 18 18   ?Anxiety Difficulty Extremely difficult  Extremely difficult Extremely difficult  ? ? ? ?  10/10/2021  ? 10:00 AM  ?Depression screen PHQ 2/9  ?Decreased Interest 3  ?Down, Depressed, Hopeless 3  ?PHQ - 2 Score 6  ?Altered sleeping 1  ?Tired, decreased energy 3  ?Change in appetite 3  ?Feeling bad or failure about yourself  3  ?Trouble concentrating 1  ?Moving slowly or fidgety/restless 2  ?Suicidal thoughts 0  ?PHQ-9 Score 19  ?Difficult doing work/chores Extremely dIfficult  ? ? ?BP Readings from Last 3 Encounters:  ?10/10/21 108/70  ?02/02/21 102/78  ?12/15/20 138/82  ? ? ?Physical Exam ?Vitals and nursing note reviewed.  ?Constitutional:   ?    General: She is not in acute distress. ?   Appearance: She is well-developed and underweight.  ?HENT:  ?   Head: Normocephalic and atraumatic.  ?   Right Ear: Tympanic membrane and ear canal normal.  ?   Left Ear: Tympa

## 2021-10-11 LAB — COMPREHENSIVE METABOLIC PANEL
ALT: 9 IU/L (ref 0–32)
AST: 14 IU/L (ref 0–40)
Albumin/Globulin Ratio: 1.5 (ref 1.2–2.2)
Albumin: 4.2 g/dL (ref 3.8–4.8)
Alkaline Phosphatase: 97 IU/L (ref 44–121)
BUN/Creatinine Ratio: 8 — ABNORMAL LOW (ref 12–28)
BUN: 7 mg/dL — ABNORMAL LOW (ref 8–27)
Bilirubin Total: 0.3 mg/dL (ref 0.0–1.2)
CO2: 25 mmol/L (ref 20–29)
Calcium: 9.4 mg/dL (ref 8.7–10.3)
Chloride: 97 mmol/L (ref 96–106)
Creatinine, Ser: 0.85 mg/dL (ref 0.57–1.00)
Globulin, Total: 2.8 g/dL (ref 1.5–4.5)
Glucose: 105 mg/dL — ABNORMAL HIGH (ref 70–99)
Potassium: 4.3 mmol/L (ref 3.5–5.2)
Sodium: 137 mmol/L (ref 134–144)
Total Protein: 7 g/dL (ref 6.0–8.5)
eGFR: 74 mL/min/{1.73_m2} (ref >59)

## 2021-10-11 LAB — CBC WITH DIFFERENTIAL/PLATELET
Basophils Absolute: 0.1 10*3/uL (ref 0.0–0.2)
Basos: 1 %
EOS (ABSOLUTE): 0.1 10*3/uL (ref 0.0–0.4)
Eos: 1 %
Hematocrit: 37.5 % (ref 34.0–46.6)
Hemoglobin: 13.3 g/dL (ref 11.1–15.9)
Immature Grans (Abs): 0 10*3/uL (ref 0.0–0.1)
Immature Granulocytes: 0 %
Lymphocytes Absolute: 0.7 10*3/uL (ref 0.7–3.1)
Lymphs: 7 %
MCH: 32.9 pg (ref 26.6–33.0)
MCHC: 35.5 g/dL (ref 31.5–35.7)
MCV: 93 fL (ref 79–97)
Monocytes Absolute: 0.4 10*3/uL (ref 0.1–0.9)
Monocytes: 5 %
Neutrophils Absolute: 8 10*3/uL — ABNORMAL HIGH (ref 1.4–7.0)
Neutrophils: 86 %
Platelets: 236 10*3/uL (ref 150–450)
RBC: 4.04 x10E6/uL (ref 3.77–5.28)
RDW: 13.3 % (ref 11.7–15.4)
WBC: 9.3 10*3/uL (ref 3.4–10.8)

## 2021-10-11 LAB — LIPID PANEL
Chol/HDL Ratio: 1.8 ratio (ref 0.0–4.4)
Cholesterol, Total: 169 mg/dL (ref 100–199)
HDL: 93 mg/dL (ref >39)
LDL Chol Calc (NIH): 63 mg/dL (ref 0–99)
Triglycerides: 65 mg/dL (ref 0–149)
VLDL Cholesterol Cal: 13 mg/dL (ref 5–40)

## 2021-10-11 LAB — TSH+FREE T4
Free T4: 1.12 ng/dL (ref 0.82–1.77)
TSH: 2.7 u[IU]/mL (ref 0.450–4.500)

## 2021-10-19 ENCOUNTER — Other Ambulatory Visit: Payer: Self-pay | Admitting: Internal Medicine

## 2021-10-19 ENCOUNTER — Ambulatory Visit
Admission: RE | Admit: 2021-10-19 | Discharge: 2021-10-19 | Disposition: A | Discharge status: Home / Self Care | Payer: Medicare Other | Source: Ambulatory Visit | Attending: Internal Medicine | Admitting: Internal Medicine

## 2021-10-19 DIAGNOSIS — Z780 Asymptomatic menopausal state: Secondary | ICD-10-CM | POA: Diagnosis not present

## 2021-10-19 DIAGNOSIS — M810 Age-related osteoporosis without current pathological fracture: Secondary | ICD-10-CM | POA: Diagnosis not present

## 2021-10-19 DIAGNOSIS — M8588 Other specified disorders of bone density and structure, other site: Secondary | ICD-10-CM | POA: Diagnosis not present

## 2021-10-30 ENCOUNTER — Telehealth: Payer: Self-pay | Admitting: Internal Medicine

## 2021-10-30 NOTE — Telephone Encounter (Signed)
Pt is calling because she received the referral information for [facility]. Pt is requesting a refferal in [place] to Endo

## 2021-11-02 ENCOUNTER — Telehealth: Payer: Self-pay | Admitting: Internal Medicine

## 2021-11-02 NOTE — Telephone Encounter (Signed)
Copied from [place] [id number]. Topic: General - Call Back - No Documentation >> Nov 02, 2021  4:40 PM Carolyn Dunlap wrote: Reason for CRM: Pt stated she was returning a missed call she had. Pt requests call back

## 2021-11-03 NOTE — Telephone Encounter (Signed)
Called pt she stated she did not hear anything from her referral for endo. Gave pt the number to call and schedule. Told pt to call back if she has any problems making an appointment. Pt verbalized understanding.  KP

## 2021-11-20 DIAGNOSIS — F332 Major depressive disorder, recurrent severe without psychotic features: Secondary | ICD-10-CM | POA: Diagnosis not present

## 2021-11-20 DIAGNOSIS — F418 Other specified anxiety disorders: Secondary | ICD-10-CM | POA: Diagnosis not present

## 2021-11-20 DIAGNOSIS — G3184 Mild cognitive impairment, so stated: Secondary | ICD-10-CM | POA: Diagnosis not present

## 2022-04-16 ENCOUNTER — Telehealth: Payer: Self-pay | Admitting: Internal Medicine

## 2022-04-16 DIAGNOSIS — F332 Major depressive disorder, recurrent severe without psychotic features: Secondary | ICD-10-CM | POA: Diagnosis not present

## 2022-04-16 NOTE — Telephone Encounter (Signed)
Would like to coordinate care because she is concerned about the patients body weight. She would love to talk to Dr. Army Melia about this patient.  [phone]

## 2022-06-13 DIAGNOSIS — Z5181 Encounter for therapeutic drug level monitoring: Secondary | ICD-10-CM | POA: Diagnosis not present

## 2022-06-13 DIAGNOSIS — Z79899 Other long term (current) drug therapy: Secondary | ICD-10-CM | POA: Diagnosis not present

## 2022-06-13 DIAGNOSIS — F332 Major depressive disorder, recurrent severe without psychotic features: Secondary | ICD-10-CM | POA: Diagnosis not present

## 2022-07-09 ENCOUNTER — Ambulatory Visit (INDEPENDENT_AMBULATORY_CARE_PROVIDER_SITE_OTHER): Payer: Medicare Other

## 2022-07-09 DIAGNOSIS — Z0000 Encounter for general adult medical examination without abnormal findings: Secondary | ICD-10-CM | POA: Diagnosis not present

## 2022-07-09 NOTE — Patient Instructions (Signed)

## 2022-07-09 NOTE — Progress Notes (Signed)
I connected with  Carolyn Dunlap on 07/09/22 by a audio enabled telemedicine application and verified that I am speaking with the correct person using two identifiers.  Patient Location: Home  Provider Location: Office/Clinic  I discussed the limitations of evaluation and management by telemedicine. The patient expressed understanding and agreed to proceed.  Subjective:   Carolyn Dunlap is a 72 y.o. female who presents for Medicare Annual (Subsequent) preventive examination.  Review of Systems    Per HPI unless specifically indicated below.  Cardiac Risk Factors include: advanced age (>38mn, >>53women);female gender          Objective:       10/10/2021    9:54 AM 02/02/2021   10:38 AM 12/15/2020   10:23 AM  Vitals with BMI  Height '5\' 7"'$  '5\' 7"'$  '5\' 7"'$   Weight 87 lbs 3 oz 84 lbs 88 lbs 14 oz  BMI 13.65 160.10193.23 Systolic [id number] Diastolic 70 78 82  Pulse 72 98 92    Today's Vitals   07/09/22 1415  PainSc: 0-No pain   There is no height or weight on file to calculate BMI.     07/09/2022    2:17 PM 07/05/2021    2:09 PM 03/21/2020   11:03 AM 03/18/2019   11:06 AM 07/10/2017    9:18 AM 05/29/2016   10:36 AM 08/09/2015    2:53 PM  Advanced Directives  Does Patient Have a Medical Advance Directive? No;Yes Yes Yes Yes Yes No;Yes Yes  Type of AParamedicof ALa BocaLiving will HJamestownLiving will [place]Living will [place]Paloma CreekLiving will [place]UniondaleLiving will Living will Living will  Does patient want to make changes to medical advance directive? No - Patient declined    No - Patient declined    Copy of HOssianin Chart? No - copy requested No - copy requested No - copy requested No - copy requested No - copy requested      Current Medications (verified) Outpatient Encounter Medications as of 07/09/2022  Medication Sig   albuterol (VENTOLIN HFA)  108 (90 Base) MCG/ACT inhaler Inhale 2 puffs into the lungs every 6 (six) hours as needed for wheezing or shortness of breath.   Calcium-Magnesium-Vitamin D (CALCIUM 500 PO) Take 1 tablet by mouth daily.   cyproheptadine (PERIACTIN) 4 MG tablet TAKE 1/2 (ONE-HALF) TABLET BY MOUTH 4 TIMES DAILY FOR 7 DAYS;THEN 1 TAB 4 TIMES DAILY FOR 7 DAYS; THEN 1.5 TABS 4 TIMES DAILY FOR 7 DAYS; THEN 2 TABS 4 TIMES DAILY THEREAFTER FOR APPETITE STIMULATION   mirtazapine (REMERON) 45 MG tablet Take 45 mg by mouth at bedtime.   Multiple Vitamin (MULTI-DAY PO) Take 1 tablet by mouth daily.   OLANZapine (ZYPREXA) 10 MG tablet Take 10 mg by mouth at bedtime.   SUMAtriptan (IMITREX) 100 MG tablet Take 1 tablet (100 mg total) by mouth as needed. May repeat in 2 hours if headache persists or recurs.   Tiotropium Bromide Monohydrate (SPIRIVA RESPIMAT) 1.25 MCG/ACT AERS Inhale 2 puffs into the lungs daily.   traZODone (DESYREL) 100 MG tablet Take 100 mg by mouth at bedtime.   Venlafaxine HCl 225 MG TB24 Take 1 tablet by mouth every morning.   zolpidem (AMBIEN) 10 MG tablet Take 10 mg by mouth at bedtime as needed.   [DISCONTINUED] cyclobenzaprine (FLEXERIL) 10 MG tablet Take 1 tablet (10 mg total) by mouth at  bedtime. (Patient not taking: Reported on 07/09/2022)   No facility-administered encounter medications on file as of 07/09/2022.    Allergies (verified) Zoloft [sertraline hcl], Adhesive [tape], and Penicillins   History: Past Medical History:  Diagnosis Date   Depression    GERD (gastroesophageal reflux disease)    Insomnia    Migraine headache    weekly   PONV (postoperative nausea and vomiting)    in distant past   Past Surgical History:  Procedure Laterality Date   AUGMENTATION MAMMAPLASTY Bilateral [place]   CATARACT EXTRACTION  2002   COLONOSCOPY WITH PROPOFOL N/A 07/10/2017   Procedure: COLONOSCOPY WITH PROPOFOL;  Surgeon: Lin Landsman, MD;  Location: [facility];  Service: Endoscopy;  Laterality: N/A;   POLYPECTOMY N/A 07/10/2017   Procedure: POLYPECTOMY;  Surgeon: Lin Landsman, MD;  Location: [facility];  Service: Endoscopy;  Laterality: N/A;   Family History  Problem Relation Age of Onset   Hyperlipidemia Mother    Hypertension Mother    Hypertension Father    Breast cancer Neg Hx    Social History   Socioeconomic History   Marital status: Divorced    Spouse name: Not on file   Number of children: 1   Years of education: 12   Highest education level: High school graduate  Occupational History   Occupation: Retired  Tobacco Use   Smoking status: Every Day    Packs/day: 0.50    Years: 30.00    Total pack years: 15.00    Types: Cigarettes   Smokeless tobacco: Never   Tobacco comments:    0.5PPD  10/25/2020  Vaping Use   Vaping Use: Never used  Substance and Sexual Activity   Alcohol use: Not Currently   Drug use: No   Sexual activity: Not Currently  Other Topics Concern   Not on file  Social History Narrative   Pt lives with ex husband   Social Determinants of Health   Financial Resource Strain: Low Risk  (07/09/2022)   Overall Financial Resource Strain (CARDIA)    Difficulty of Paying Living Expenses: Not hard at all  Food Insecurity: No Food Insecurity (07/09/2022)   Hunger Vital Sign    Worried About Running Out of Food in the Last Year: Never true    [place] in the Last Year: Never true  Transportation Needs: No Transportation Needs (07/09/2022)   PRAPARE - Hydrologist (Medical): No    Lack of Transportation (Non-Medical): No  Physical Activity: Inactive (07/09/2022)   Exercise Vital Sign    Days of Exercise per Week: 0 days    Minutes of Exercise per Session: 0 min  Stress: Stress Concern Present (07/09/2022)   [place]    Feeling of Stress : Very much  Social Connections: Moderately  Isolated (07/09/2022)   Social Connection and Isolation Panel [NHANES]    Frequency of Communication with Friends and Family: More than three times a week    Frequency of Social Gatherings with Friends and Family: Once a week    Attends Religious Services: Never    Marine scientist or Organizations: No    Attends Archivist Meetings: Never    Marital Status: Living with partner    Tobacco Counseling Ready to quit: Not Answered Counseling given: Not Answered Tobacco comments: 0.5PPD  10/25/2020   Clinical Intake:  Pre-visit preparation  completed: No  Pain : No/denies pain Pain Score: 0-No pain     Nutritional Risks: Unintentional weight loss  How often do you need to have someone help you when you read instructions, pamphlets, or other written materials from your doctor or pharmacy?: 1 - Never  Diabetic?No     Information entered by :: Donnie Mesa, CMA   Activities of Daily Living    07/09/2022    2:12 PM 10/10/2021   10:00 AM  In your present state of health, do you have any difficulty performing the following activities:  Hearing? 0 0  Vision? 1 0  Difficulty concentrating or making decisions? 1 1  Walking or climbing stairs? 1 1  Dressing or bathing? 0 0  Doing errands, shopping? 0 0    Patient Care Team: Glean Hess, MD as PCP - General (Internal Medicine) Ulyess Blossom (Psychiatry) Brand Males, MD as Consulting Physician (Pulmonary Disease)  Indicate any recent Medical Services you may have received from other than [facility] providers in the past year (date may be approximate).     Assessment:   This is a routine wellness examination for [place].  Hearing/Vision screen Denies any vision issues. Annual Eye Exam, [employer] [facility] Denies any hearing issues.  Dietary issues and exercise activities discussed: Current Exercise Habits: The patient does not participate in regular exercise at present, Exercise limited by: psychological  condition(s)   Goals Addressed             This Visit's Progress    Quit Smoking         Depression Screen    07/09/2022    2:10 PM 10/10/2021   10:00 AM 07/05/2021    2:07 PM 02/02/2021   10:47 AM 12/15/2020   10:29 AM 10/07/2020   10:14 AM 09/19/2020    3:40 PM  PHQ 2/9 Scores  PHQ - 2 Score '6 6 6 6 6 6 2  '$ PHQ- 9 Score '21 19 18 21 22 24 3    '$ Fall Risk    07/09/2022    2:12 PM 10/10/2021   10:00 AM 07/05/2021    2:10 PM 02/02/2021   10:48 AM 12/15/2020   10:29 AM  Fall Risk   Falls in the past year? 0 0 0 0 0  Number falls in past yr: 0 0 0 0 0  Injury with Fall? 0 0 0 0 0  Risk for fall due to : No Fall Risks No Fall Risks No Fall Risks No Fall Risks   Follow up Falls evaluation completed Falls evaluation completed Falls prevention discussed Falls evaluation completed Falls evaluation completed    [place]:  Any stairs in or around the home? No  If so, are there any without handrails? No  Home free of loose throw rugs in walkways, pet beds, electrical cords, etc? Yes  Adequate lighting in your home to reduce risk of falls? Yes   ASSISTIVE DEVICES UTILIZED TO PREVENT FALLS:  Life alert? No  Use of a cane, walker or w/c? No  Grab bars in the bathroom? Yes  Shower chair or bench in shower? Yes  Elevated toilet seat or a handicapped toilet? No   TIMED UP AND GO:  Was the test performed?  unable to perform, vitual appt  .    Cognitive Function:        07/09/2022    2:13 PM 03/21/2020   11:05 AM 03/18/2019   11:09 AM  6CIT Screen  What Year? 0 points 0 points 0 points  What month? 3 points 0 points 0 points  What time? 0 points 0 points 0 points  Count back from 20 0 points 0 points 0 points  Months in reverse 0 points 0 points 0 points  Repeat phrase 0 points 2 points 0 points  Total Score 3 points 2 points 0 points    Immunizations Immunization History  Administered Date(s) Administered   Fluad Quad(high Dose 65+)  03/08/2021   Influenza,inj,quad, With Preservative 04/04/2018   Influenza-Unspecified 04/08/2017, 03/12/2019, 08/18/2021   PFIZER(Purple Top)SARS-COV-2 Vaccination 07/29/2019, 08/19/2019, 03/11/2020, 11/03/2020, 08/18/2021   [place] Pediatric Vaccine(43mo to <523yr 08/18/2021   Pneumococcal Conjugate-13 05/26/2015   Pneumococcal Polysaccharide-23 05/29/2016, 08/18/2021   Tdap 08/18/2021   Zoster Recombinat (Shingrix) 06/25/2017, 06/10/2018   Zoster, Live 02/09/2011    TDAP status: Up to date  Flu Vaccine status: Due, Education has been provided regarding the importance of this vaccine. Advised may receive this vaccine at local pharmacy or Health Dept. Aware to provide a copy of the vaccination record if obtained from local pharmacy or Health Dept. Verbalized acceptance and understanding.  Pneumococcal vaccine status: Up to date  Covid-19 vaccine status: Information provided on how to obtain vaccines.   Qualifies for Shingles Vaccine? Yes   Zostavax completed Yes   Shingrix Completed?: Yes  Screening Tests Health Maintenance  Topic Date Due   MAMMOGRAM  05/05/2020   COVID-19 Vaccine (6 - 2023-24 season) 02/09/2022   INFLUENZA VACCINE  09/09/2022 (Originally 01/09/2022)   COLONOSCOPY (Pts [id number] coverage will need to be confirmed)  07/10/2022   Medicare Annual Wellness (AWV)  07/10/2023   DTaP/Tdap/Td (2 - Td or Tdap) 08/19/2031   Pneumonia Vaccine 65+50ears old  Completed   DEXA SCAN  Completed   Hepatitis C Screening  Completed   Zoster Vaccines- Shingrix  Completed   HPV VACCINES  Aged Out   Lung Cancer Screening  Discontinued    Health Maintenance  Health Maintenance Due  Topic Date Due   MAMMOGRAM  05/05/2020   COVID-19 Vaccine (6 - 2023-24 season) 02/09/2022    Colorectal cancer screening: Type of screening: Colonoscopy. Completed 07/10/2017. Repeat every 5 years  Mammogram status: Completed 05/06/2019. Repeat every year  DEXA Scan:  10/19/2021   Lung Cancer Screening: (Low Dose CT Chest recommended if Age 48-38-80ars, 30 pack-year currently smoking OR have quit w/in 15years.) does not qualify.   Lung Cancer Screening Referral: not applicable   Additional Screening:  Hepatitis C Screening: does qualify; Completed 05/26/2015  Vision Screening: Recommended annual ophthalmology exams for early detection of glaucoma and other disorders of the eye. Is the patient up to date with their annual eye exam?  Yes  Who is the provider or what is the name of the office in which the patient attends annual eye exams? [facility] [place] pt is not established with a provider, would they like to be referred to a provider to establish care? No .   Dental Screening: Recommended annual dental exams for proper oral hygiene  Community Resource Referral / Chronic Care Management: CRR required this visit?  No   CCM required this visit?  No      Plan:     I have personally reviewed and noted the following in the patient's chart:   Medical and social history Use of alcohol, tobacco or illicit drugs  Current medications and supplements including opioid prescriptions. Patient is not currently taking opioid  prescriptions. Functional ability and status Nutritional status Physical activity Advanced directives List of other physicians Hospitalizations, surgeries, and ER visits in previous 12 months Vitals Screenings to include cognitive, depression, and falls Referrals and appointments  In addition, I have reviewed and discussed with patient certain preventive protocols, quality metrics, and best practice recommendations. A written personalized care plan for preventive services as well as general preventive health recommendations were provided to patient.    Ms. Eley , Thank you for taking time to come for your Medicare Wellness Visit. I appreciate your ongoing commitment to your health goals. Please review the following  plan we discussed and let me know if I can assist you in the future.   These are the goals we discussed:  Goals      Patient Stated     Pt states she would like to manage stress better and hopes her son will be in a better situation.      Quit Smoking     If you wish to quit smoking, help is available. For free tobacco cessation program offerings call the [facility] at [phone] or Live Well Line at (713)186-0128. You may also visit [email or web address] or email [email or web address] for more information on other programs.       Quit Smoking        This is a list of the screening recommended for you and due dates:  Health Maintenance  Topic Date Due   Mammogram  05/05/2020   COVID-19 Vaccine (6 - 2023-24 season) 02/09/2022   Flu Shot  09/09/2022*   Colon Cancer Screening  07/10/2022   Medicare Annual Wellness Visit  07/10/2023   DTaP/Tdap/Td vaccine (2 - Td or Tdap) 08/19/2031   Pneumonia Vaccine  Completed   DEXA scan (bone density measurement)  Completed   Hepatitis C Screening: USPSTF Recommendation to screen - Ages 59-79 yo.  Completed   Zoster (Shingles) Vaccine  Completed   HPV Vaccine  Aged Out   Screening for Lung Cancer  Discontinued  *Topic was postponed. The date shown is not the original due date.     Wilson Singer, [place]   07/09/2022   Nurse Notes: Approximately 30 minute Non-Face -To-Face Medicare Wellness Visit

## 2022-07-12 HISTORY — PX: ORIF HIP FRACTURE: SHX2125

## 2022-07-16 ENCOUNTER — Ambulatory Visit: Payer: Medicare Other | Admitting: Internal Medicine

## 2022-07-16 DIAGNOSIS — S0990XA Unspecified injury of head, initial encounter: Secondary | ICD-10-CM | POA: Diagnosis not present

## 2022-07-16 DIAGNOSIS — S72091A Other fracture of head and neck of right femur, initial encounter for closed fracture: Secondary | ICD-10-CM | POA: Diagnosis not present

## 2022-07-16 DIAGNOSIS — R9431 Abnormal electrocardiogram [ECG] [EKG]: Secondary | ICD-10-CM | POA: Diagnosis not present

## 2022-07-16 DIAGNOSIS — Z87891 Personal history of nicotine dependence: Secondary | ICD-10-CM | POA: Diagnosis not present

## 2022-07-16 DIAGNOSIS — S72001A Fracture of unspecified part of neck of right femur, initial encounter for closed fracture: Secondary | ICD-10-CM | POA: Diagnosis not present

## 2022-07-16 DIAGNOSIS — Z23 Encounter for immunization: Secondary | ICD-10-CM | POA: Diagnosis not present

## 2022-07-16 DIAGNOSIS — I4581 Long QT syndrome: Secondary | ICD-10-CM | POA: Diagnosis not present

## 2022-07-16 DIAGNOSIS — R918 Other nonspecific abnormal finding of lung field: Secondary | ICD-10-CM | POA: Diagnosis not present

## 2022-07-16 DIAGNOSIS — J984 Other disorders of lung: Secondary | ICD-10-CM | POA: Diagnosis not present

## 2022-07-16 DIAGNOSIS — I69319 Unspecified symptoms and signs involving cognitive functions following cerebral infarction: Secondary | ICD-10-CM | POA: Diagnosis not present

## 2022-07-16 DIAGNOSIS — J9811 Atelectasis: Secondary | ICD-10-CM | POA: Diagnosis not present

## 2022-07-16 DIAGNOSIS — M25551 Pain in right hip: Secondary | ICD-10-CM | POA: Diagnosis not present

## 2022-07-16 DIAGNOSIS — J90 Pleural effusion, not elsewhere classified: Secondary | ICD-10-CM | POA: Diagnosis not present

## 2022-07-16 DIAGNOSIS — I517 Cardiomegaly: Secondary | ICD-10-CM | POA: Diagnosis not present

## 2022-07-16 DIAGNOSIS — W19XXXA Unspecified fall, initial encounter: Secondary | ICD-10-CM | POA: Diagnosis not present

## 2022-07-16 DIAGNOSIS — J479 Bronchiectasis, uncomplicated: Secondary | ICD-10-CM | POA: Diagnosis not present

## 2022-07-16 DIAGNOSIS — R489 Unspecified symbolic dysfunctions: Secondary | ICD-10-CM | POA: Diagnosis not present

## 2022-07-16 DIAGNOSIS — S299XXA Unspecified injury of thorax, initial encounter: Secondary | ICD-10-CM | POA: Diagnosis not present

## 2022-07-17 DIAGNOSIS — W19XXXA Unspecified fall, initial encounter: Secondary | ICD-10-CM | POA: Diagnosis not present

## 2022-07-17 DIAGNOSIS — M542 Cervicalgia: Secondary | ICD-10-CM | POA: Diagnosis not present

## 2022-07-18 DIAGNOSIS — F17200 Nicotine dependence, unspecified, uncomplicated: Secondary | ICD-10-CM | POA: Diagnosis not present

## 2022-07-19 DIAGNOSIS — R489 Unspecified symbolic dysfunctions: Secondary | ICD-10-CM | POA: Diagnosis not present

## 2022-07-20 DIAGNOSIS — S72031A Displaced midcervical fracture of right femur, initial encounter for closed fracture: Secondary | ICD-10-CM | POA: Diagnosis not present

## 2022-07-20 DIAGNOSIS — S3993XA Unspecified injury of pelvis, initial encounter: Secondary | ICD-10-CM | POA: Diagnosis not present

## 2022-07-20 DIAGNOSIS — M80051D Age-related osteoporosis with current pathological fracture, right femur, subsequent encounter for fracture with routine healing: Secondary | ICD-10-CM | POA: Insufficient documentation

## 2022-07-20 DIAGNOSIS — I69354 Hemiplegia and hemiparesis following cerebral infarction affecting left non-dominant side: Secondary | ICD-10-CM | POA: Insufficient documentation

## 2022-07-20 DIAGNOSIS — Z96641 Presence of right artificial hip joint: Secondary | ICD-10-CM | POA: Insufficient documentation

## 2022-07-20 DIAGNOSIS — W19XXXA Unspecified fall, initial encounter: Secondary | ICD-10-CM | POA: Diagnosis not present

## 2022-07-20 DIAGNOSIS — G8918 Other acute postprocedural pain: Secondary | ICD-10-CM | POA: Diagnosis not present

## 2022-07-21 DIAGNOSIS — R296 Repeated falls: Secondary | ICD-10-CM | POA: Diagnosis not present

## 2022-07-21 DIAGNOSIS — R9431 Abnormal electrocardiogram [ECG] [EKG]: Secondary | ICD-10-CM | POA: Diagnosis not present

## 2022-07-21 DIAGNOSIS — J9811 Atelectasis: Secondary | ICD-10-CM | POA: Diagnosis not present

## 2022-07-21 DIAGNOSIS — E871 Hypo-osmolality and hyponatremia: Secondary | ICD-10-CM | POA: Diagnosis not present

## 2022-07-21 DIAGNOSIS — J90 Pleural effusion, not elsewhere classified: Secondary | ICD-10-CM | POA: Diagnosis not present

## 2022-07-21 DIAGNOSIS — F1011 Alcohol abuse, in remission: Secondary | ICD-10-CM | POA: Diagnosis not present

## 2022-07-21 DIAGNOSIS — I63231 Cerebral infarction due to unspecified occlusion or stenosis of right carotid arteries: Secondary | ICD-10-CM | POA: Diagnosis not present

## 2022-07-21 DIAGNOSIS — W19XXXA Unspecified fall, initial encounter: Secondary | ICD-10-CM | POA: Diagnosis not present

## 2022-07-21 DIAGNOSIS — R41841 Cognitive communication deficit: Secondary | ICD-10-CM | POA: Diagnosis not present

## 2022-07-21 DIAGNOSIS — R29818 Other symptoms and signs involving the nervous system: Secondary | ICD-10-CM | POA: Diagnosis not present

## 2022-07-21 DIAGNOSIS — T1490XA Injury, unspecified, initial encounter: Secondary | ICD-10-CM | POA: Diagnosis not present

## 2022-07-21 DIAGNOSIS — I6521 Occlusion and stenosis of right carotid artery: Secondary | ICD-10-CM | POA: Diagnosis not present

## 2022-07-21 DIAGNOSIS — I639 Cerebral infarction, unspecified: Secondary | ICD-10-CM | POA: Diagnosis not present

## 2022-07-21 DIAGNOSIS — R489 Unspecified symbolic dysfunctions: Secondary | ICD-10-CM | POA: Diagnosis not present

## 2022-07-21 DIAGNOSIS — S728X1A Other fracture of right femur, initial encounter for closed fracture: Secondary | ICD-10-CM | POA: Diagnosis not present

## 2022-07-21 DIAGNOSIS — S72001A Fracture of unspecified part of neck of right femur, initial encounter for closed fracture: Secondary | ICD-10-CM | POA: Diagnosis not present

## 2022-07-21 DIAGNOSIS — R4182 Altered mental status, unspecified: Secondary | ICD-10-CM | POA: Diagnosis not present

## 2022-07-22 DIAGNOSIS — S72001A Fracture of unspecified part of neck of right femur, initial encounter for closed fracture: Secondary | ICD-10-CM | POA: Diagnosis not present

## 2022-07-22 DIAGNOSIS — I63231 Cerebral infarction due to unspecified occlusion or stenosis of right carotid arteries: Secondary | ICD-10-CM | POA: Diagnosis not present

## 2022-07-22 DIAGNOSIS — E871 Hypo-osmolality and hyponatremia: Secondary | ICD-10-CM | POA: Diagnosis not present

## 2022-07-22 DIAGNOSIS — I6521 Occlusion and stenosis of right carotid artery: Secondary | ICD-10-CM | POA: Diagnosis not present

## 2022-07-23 DIAGNOSIS — I63511 Cerebral infarction due to unspecified occlusion or stenosis of right middle cerebral artery: Secondary | ICD-10-CM | POA: Diagnosis not present

## 2022-07-23 DIAGNOSIS — I69319 Unspecified symptoms and signs involving cognitive functions following cerebral infarction: Secondary | ICD-10-CM | POA: Diagnosis not present

## 2022-07-23 DIAGNOSIS — K7460 Unspecified cirrhosis of liver: Secondary | ICD-10-CM | POA: Diagnosis not present

## 2022-07-23 DIAGNOSIS — D62 Acute posthemorrhagic anemia: Secondary | ICD-10-CM | POA: Diagnosis not present

## 2022-07-24 DIAGNOSIS — I340 Nonrheumatic mitral (valve) insufficiency: Secondary | ICD-10-CM | POA: Diagnosis not present

## 2022-07-24 DIAGNOSIS — J439 Emphysema, unspecified: Secondary | ICD-10-CM | POA: Diagnosis not present

## 2022-07-24 DIAGNOSIS — J90 Pleural effusion, not elsewhere classified: Secondary | ICD-10-CM | POA: Diagnosis not present

## 2022-07-24 DIAGNOSIS — R4182 Altered mental status, unspecified: Secondary | ICD-10-CM | POA: Diagnosis not present

## 2022-07-24 DIAGNOSIS — I69319 Unspecified symptoms and signs involving cognitive functions following cerebral infarction: Secondary | ICD-10-CM | POA: Diagnosis not present

## 2022-07-24 DIAGNOSIS — J9811 Atelectasis: Secondary | ICD-10-CM | POA: Diagnosis not present

## 2022-07-24 DIAGNOSIS — J811 Chronic pulmonary edema: Secondary | ICD-10-CM | POA: Diagnosis not present

## 2022-07-25 DIAGNOSIS — Z7409 Other reduced mobility: Secondary | ICD-10-CM | POA: Diagnosis not present

## 2022-07-25 DIAGNOSIS — R1312 Dysphagia, oropharyngeal phase: Secondary | ICD-10-CM | POA: Diagnosis not present

## 2022-07-25 DIAGNOSIS — R5381 Other malaise: Secondary | ICD-10-CM | POA: Diagnosis not present

## 2022-07-25 DIAGNOSIS — Z7189 Other specified counseling: Secondary | ICD-10-CM | POA: Diagnosis not present

## 2022-07-25 DIAGNOSIS — Z789 Other specified health status: Secondary | ICD-10-CM | POA: Diagnosis not present

## 2022-07-25 DIAGNOSIS — I63511 Cerebral infarction due to unspecified occlusion or stenosis of right middle cerebral artery: Secondary | ICD-10-CM | POA: Diagnosis not present

## 2022-07-25 DIAGNOSIS — R296 Repeated falls: Secondary | ICD-10-CM | POA: Diagnosis not present

## 2022-07-26 DIAGNOSIS — I69319 Unspecified symptoms and signs involving cognitive functions following cerebral infarction: Secondary | ICD-10-CM | POA: Diagnosis not present

## 2022-07-28 DIAGNOSIS — I69319 Unspecified symptoms and signs involving cognitive functions following cerebral infarction: Secondary | ICD-10-CM | POA: Diagnosis not present

## 2022-07-29 DIAGNOSIS — I69319 Unspecified symptoms and signs involving cognitive functions following cerebral infarction: Secondary | ICD-10-CM | POA: Diagnosis not present

## 2022-07-30 DIAGNOSIS — E440 Moderate protein-calorie malnutrition: Secondary | ICD-10-CM | POA: Diagnosis not present

## 2022-07-30 DIAGNOSIS — I63511 Cerebral infarction due to unspecified occlusion or stenosis of right middle cerebral artery: Secondary | ICD-10-CM | POA: Diagnosis not present

## 2022-07-30 DIAGNOSIS — I69319 Unspecified symptoms and signs involving cognitive functions following cerebral infarction: Secondary | ICD-10-CM | POA: Diagnosis not present

## 2022-07-30 DIAGNOSIS — E871 Hypo-osmolality and hyponatremia: Secondary | ICD-10-CM | POA: Diagnosis not present

## 2022-07-30 DIAGNOSIS — I6521 Occlusion and stenosis of right carotid artery: Secondary | ICD-10-CM | POA: Diagnosis not present

## 2022-07-30 DIAGNOSIS — R630 Anorexia: Secondary | ICD-10-CM | POA: Diagnosis not present

## 2022-07-31 ENCOUNTER — Ambulatory Visit: Payer: Medicare Other | Admitting: Internal Medicine

## 2022-07-31 DIAGNOSIS — R630 Anorexia: Secondary | ICD-10-CM | POA: Diagnosis not present

## 2022-07-31 DIAGNOSIS — I69319 Unspecified symptoms and signs involving cognitive functions following cerebral infarction: Secondary | ICD-10-CM | POA: Diagnosis not present

## 2022-07-31 DIAGNOSIS — E871 Hypo-osmolality and hyponatremia: Secondary | ICD-10-CM | POA: Diagnosis not present

## 2022-07-31 DIAGNOSIS — I6521 Occlusion and stenosis of right carotid artery: Secondary | ICD-10-CM | POA: Diagnosis not present

## 2022-07-31 DIAGNOSIS — E440 Moderate protein-calorie malnutrition: Secondary | ICD-10-CM | POA: Diagnosis not present

## 2022-07-31 DIAGNOSIS — I63511 Cerebral infarction due to unspecified occlusion or stenosis of right middle cerebral artery: Secondary | ICD-10-CM | POA: Diagnosis not present

## 2022-08-01 DIAGNOSIS — E440 Moderate protein-calorie malnutrition: Secondary | ICD-10-CM | POA: Diagnosis not present

## 2022-08-01 DIAGNOSIS — E871 Hypo-osmolality and hyponatremia: Secondary | ICD-10-CM | POA: Diagnosis not present

## 2022-08-01 DIAGNOSIS — R630 Anorexia: Secondary | ICD-10-CM | POA: Diagnosis not present

## 2022-08-01 DIAGNOSIS — I63511 Cerebral infarction due to unspecified occlusion or stenosis of right middle cerebral artery: Secondary | ICD-10-CM | POA: Diagnosis not present

## 2022-08-01 DIAGNOSIS — I6521 Occlusion and stenosis of right carotid artery: Secondary | ICD-10-CM | POA: Diagnosis not present

## 2022-08-01 DIAGNOSIS — I69319 Unspecified symptoms and signs involving cognitive functions following cerebral infarction: Secondary | ICD-10-CM | POA: Diagnosis not present

## 2022-08-02 DIAGNOSIS — E871 Hypo-osmolality and hyponatremia: Secondary | ICD-10-CM | POA: Diagnosis not present

## 2022-08-02 DIAGNOSIS — I6521 Occlusion and stenosis of right carotid artery: Secondary | ICD-10-CM | POA: Diagnosis not present

## 2022-08-02 DIAGNOSIS — I69319 Unspecified symptoms and signs involving cognitive functions following cerebral infarction: Secondary | ICD-10-CM | POA: Diagnosis not present

## 2022-08-02 DIAGNOSIS — R630 Anorexia: Secondary | ICD-10-CM | POA: Diagnosis not present

## 2022-08-02 DIAGNOSIS — I63511 Cerebral infarction due to unspecified occlusion or stenosis of right middle cerebral artery: Secondary | ICD-10-CM | POA: Diagnosis not present

## 2022-08-02 DIAGNOSIS — E440 Moderate protein-calorie malnutrition: Secondary | ICD-10-CM | POA: Diagnosis not present

## 2022-08-03 DIAGNOSIS — E871 Hypo-osmolality and hyponatremia: Secondary | ICD-10-CM | POA: Diagnosis not present

## 2022-08-03 DIAGNOSIS — I63511 Cerebral infarction due to unspecified occlusion or stenosis of right middle cerebral artery: Secondary | ICD-10-CM | POA: Diagnosis not present

## 2022-08-03 DIAGNOSIS — I6521 Occlusion and stenosis of right carotid artery: Secondary | ICD-10-CM | POA: Diagnosis not present

## 2022-08-03 DIAGNOSIS — I69319 Unspecified symptoms and signs involving cognitive functions following cerebral infarction: Secondary | ICD-10-CM | POA: Diagnosis not present

## 2022-08-03 DIAGNOSIS — R630 Anorexia: Secondary | ICD-10-CM | POA: Diagnosis not present

## 2022-08-03 DIAGNOSIS — E440 Moderate protein-calorie malnutrition: Secondary | ICD-10-CM | POA: Diagnosis not present

## 2022-08-04 DIAGNOSIS — E440 Moderate protein-calorie malnutrition: Secondary | ICD-10-CM | POA: Diagnosis not present

## 2022-08-04 DIAGNOSIS — R630 Anorexia: Secondary | ICD-10-CM | POA: Diagnosis not present

## 2022-08-04 DIAGNOSIS — E871 Hypo-osmolality and hyponatremia: Secondary | ICD-10-CM | POA: Diagnosis not present

## 2022-08-04 DIAGNOSIS — I69319 Unspecified symptoms and signs involving cognitive functions following cerebral infarction: Secondary | ICD-10-CM | POA: Diagnosis not present

## 2022-08-04 DIAGNOSIS — I63511 Cerebral infarction due to unspecified occlusion or stenosis of right middle cerebral artery: Secondary | ICD-10-CM | POA: Diagnosis not present

## 2022-08-04 DIAGNOSIS — I6521 Occlusion and stenosis of right carotid artery: Secondary | ICD-10-CM | POA: Diagnosis not present

## 2022-08-05 DIAGNOSIS — I69319 Unspecified symptoms and signs involving cognitive functions following cerebral infarction: Secondary | ICD-10-CM | POA: Diagnosis not present

## 2022-08-05 DIAGNOSIS — E871 Hypo-osmolality and hyponatremia: Secondary | ICD-10-CM | POA: Diagnosis not present

## 2022-08-05 DIAGNOSIS — E440 Moderate protein-calorie malnutrition: Secondary | ICD-10-CM | POA: Diagnosis not present

## 2022-08-05 DIAGNOSIS — I6521 Occlusion and stenosis of right carotid artery: Secondary | ICD-10-CM | POA: Diagnosis not present

## 2022-08-05 DIAGNOSIS — I63511 Cerebral infarction due to unspecified occlusion or stenosis of right middle cerebral artery: Secondary | ICD-10-CM | POA: Diagnosis not present

## 2022-08-05 DIAGNOSIS — R630 Anorexia: Secondary | ICD-10-CM | POA: Diagnosis not present

## 2022-08-06 DIAGNOSIS — I69319 Unspecified symptoms and signs involving cognitive functions following cerebral infarction: Secondary | ICD-10-CM | POA: Diagnosis not present

## 2022-08-06 DIAGNOSIS — S72001D Fracture of unspecified part of neck of right femur, subsequent encounter for closed fracture with routine healing: Secondary | ICD-10-CM | POA: Diagnosis not present

## 2022-08-06 DIAGNOSIS — Z743 Need for continuous supervision: Secondary | ICD-10-CM | POA: Diagnosis not present

## 2022-08-06 DIAGNOSIS — Z23 Encounter for immunization: Secondary | ICD-10-CM | POA: Diagnosis not present

## 2022-08-07 DIAGNOSIS — R5381 Other malaise: Secondary | ICD-10-CM | POA: Diagnosis not present

## 2022-08-08 DIAGNOSIS — R5381 Other malaise: Secondary | ICD-10-CM | POA: Diagnosis not present

## 2022-08-09 DIAGNOSIS — R5381 Other malaise: Secondary | ICD-10-CM | POA: Diagnosis not present

## 2022-08-14 DIAGNOSIS — R5381 Other malaise: Secondary | ICD-10-CM | POA: Diagnosis not present

## 2022-08-15 DIAGNOSIS — R5381 Other malaise: Secondary | ICD-10-CM | POA: Diagnosis not present

## 2022-08-15 DIAGNOSIS — F419 Anxiety disorder, unspecified: Secondary | ICD-10-CM | POA: Diagnosis not present

## 2022-08-15 DIAGNOSIS — F331 Major depressive disorder, recurrent, moderate: Secondary | ICD-10-CM | POA: Diagnosis not present

## 2022-08-16 DIAGNOSIS — R5381 Other malaise: Secondary | ICD-10-CM | POA: Diagnosis not present

## 2022-08-20 DIAGNOSIS — M810 Age-related osteoporosis without current pathological fracture: Secondary | ICD-10-CM | POA: Diagnosis not present

## 2022-08-20 DIAGNOSIS — W1839XD Other fall on same level, subsequent encounter: Secondary | ICD-10-CM | POA: Diagnosis not present

## 2022-08-20 DIAGNOSIS — F32A Depression, unspecified: Secondary | ICD-10-CM | POA: Diagnosis not present

## 2022-08-20 DIAGNOSIS — F17210 Nicotine dependence, cigarettes, uncomplicated: Secondary | ICD-10-CM | POA: Diagnosis not present

## 2022-08-20 DIAGNOSIS — S72041D Displaced fracture of base of neck of right femur, subsequent encounter for closed fracture with routine healing: Secondary | ICD-10-CM | POA: Diagnosis not present

## 2022-08-20 DIAGNOSIS — J439 Emphysema, unspecified: Secondary | ICD-10-CM | POA: Diagnosis not present

## 2022-08-20 DIAGNOSIS — Z96641 Presence of right artificial hip joint: Secondary | ICD-10-CM | POA: Diagnosis not present

## 2022-08-20 DIAGNOSIS — Z4789 Encounter for other orthopedic aftercare: Secondary | ICD-10-CM | POA: Diagnosis not present

## 2022-08-20 DIAGNOSIS — F419 Anxiety disorder, unspecified: Secondary | ICD-10-CM | POA: Diagnosis not present

## 2022-08-22 DIAGNOSIS — R5381 Other malaise: Secondary | ICD-10-CM | POA: Diagnosis not present

## 2022-08-23 DIAGNOSIS — R5381 Other malaise: Secondary | ICD-10-CM | POA: Diagnosis not present

## 2022-08-23 DIAGNOSIS — Z23 Encounter for immunization: Secondary | ICD-10-CM | POA: Diagnosis not present

## 2022-08-24 DIAGNOSIS — R2241 Localized swelling, mass and lump, right lower limb: Secondary | ICD-10-CM | POA: Diagnosis not present

## 2022-08-24 DIAGNOSIS — R5381 Other malaise: Secondary | ICD-10-CM | POA: Diagnosis not present

## 2022-08-27 DIAGNOSIS — G4700 Insomnia, unspecified: Secondary | ICD-10-CM | POA: Diagnosis not present

## 2022-08-27 DIAGNOSIS — F1011 Alcohol abuse, in remission: Secondary | ICD-10-CM | POA: Diagnosis not present

## 2022-08-27 DIAGNOSIS — Z8673 Personal history of transient ischemic attack (TIA), and cerebral infarction without residual deficits: Secondary | ICD-10-CM | POA: Diagnosis not present

## 2022-08-27 DIAGNOSIS — R5381 Other malaise: Secondary | ICD-10-CM | POA: Diagnosis not present

## 2022-08-27 DIAGNOSIS — F419 Anxiety disorder, unspecified: Secondary | ICD-10-CM | POA: Diagnosis not present

## 2022-08-27 DIAGNOSIS — F32A Depression, unspecified: Secondary | ICD-10-CM | POA: Diagnosis not present

## 2022-08-27 DIAGNOSIS — Z8781 Personal history of (healed) traumatic fracture: Secondary | ICD-10-CM | POA: Diagnosis not present

## 2022-08-27 DIAGNOSIS — F332 Major depressive disorder, recurrent severe without psychotic features: Secondary | ICD-10-CM | POA: Diagnosis not present

## 2022-08-27 DIAGNOSIS — F330 Major depressive disorder, recurrent, mild: Secondary | ICD-10-CM | POA: Diagnosis not present

## 2022-08-27 DIAGNOSIS — Z87891 Personal history of nicotine dependence: Secondary | ICD-10-CM | POA: Diagnosis not present

## 2022-08-29 DIAGNOSIS — R5381 Other malaise: Secondary | ICD-10-CM | POA: Diagnosis not present

## 2022-08-30 DIAGNOSIS — R5381 Other malaise: Secondary | ICD-10-CM | POA: Diagnosis not present

## 2022-08-31 DIAGNOSIS — Z9181 History of falling: Secondary | ICD-10-CM | POA: Insufficient documentation

## 2022-08-31 DIAGNOSIS — I951 Orthostatic hypotension: Secondary | ICD-10-CM | POA: Insufficient documentation

## 2022-08-31 DIAGNOSIS — D649 Anemia, unspecified: Secondary | ICD-10-CM | POA: Insufficient documentation

## 2022-08-31 DIAGNOSIS — Z7901 Long term (current) use of anticoagulants: Secondary | ICD-10-CM | POA: Insufficient documentation

## 2022-08-31 DIAGNOSIS — K5900 Constipation, unspecified: Secondary | ICD-10-CM | POA: Insufficient documentation

## 2022-08-31 DIAGNOSIS — F332 Major depressive disorder, recurrent severe without psychotic features: Secondary | ICD-10-CM | POA: Insufficient documentation

## 2022-08-31 DIAGNOSIS — F17210 Nicotine dependence, cigarettes, uncomplicated: Secondary | ICD-10-CM | POA: Insufficient documentation

## 2022-08-31 DIAGNOSIS — R3915 Urgency of urination: Secondary | ICD-10-CM | POA: Insufficient documentation

## 2022-08-31 DIAGNOSIS — R350 Frequency of micturition: Secondary | ICD-10-CM | POA: Insufficient documentation

## 2022-08-31 DIAGNOSIS — E871 Hypo-osmolality and hyponatremia: Secondary | ICD-10-CM | POA: Insufficient documentation

## 2022-08-31 DIAGNOSIS — G43909 Migraine, unspecified, not intractable, without status migrainosus: Secondary | ICD-10-CM | POA: Insufficient documentation

## 2022-08-31 DIAGNOSIS — F1020 Alcohol dependence, uncomplicated: Secondary | ICD-10-CM | POA: Insufficient documentation

## 2022-09-03 ENCOUNTER — Encounter: Payer: Self-pay | Admitting: Physician Assistant

## 2022-09-03 ENCOUNTER — Ambulatory Visit (INDEPENDENT_AMBULATORY_CARE_PROVIDER_SITE_OTHER): Payer: Medicare Other | Admitting: Physician Assistant

## 2022-09-03 ENCOUNTER — Telehealth: Payer: Self-pay | Admitting: Internal Medicine

## 2022-09-03 VITALS — BP 100/64 | HR 101 | Ht 67.0 in

## 2022-09-03 DIAGNOSIS — E871 Hypo-osmolality and hyponatremia: Secondary | ICD-10-CM

## 2022-09-03 NOTE — Telephone Encounter (Signed)
Changed patients pharmacy in chart.

## 2022-09-03 NOTE — Progress Notes (Signed)
Acute Office Visit   Patient: Carolyn Dunlap   DOB: 02/13/1951   72 y.o. Female  MRN: [id number] Visit Date: 09/03/2022  Today's healthcare provider: Dani Gobble Rhaya Coale, PA-C  Introduced myself to the patient as a Journalist, newspaper and provided education on APPs in clinical practice.    Chief Complaint  Patient presents with   Low Sodium    Last Sodium was taken Friday and the results came back today and it was 125. Sodium has been low for the last 6 weeks.    Subjective    HPI HPI     Low Sodium    Additional comments: Last Sodium was taken Friday and the results came back today and it was 125. Sodium has been low for the last 6 weeks.       Last edited by Clista Bernhardt, CMA on 09/03/2022  2:18 PM.       She is here with her husband  They state she was released from rehab 3 days ago and they called stating her Sodium was low when her labs were checked on Friday The rehab center called and stated her sodium was 125 and she was instructed to go to PCP   She has been walking with a walker around her home  She reports she has been having some lower extremity swelling and has had to use her Lasix   She reports she is eating regularly and is trying to drink gatorade  She is trying to avoid drinking a lot of water to prevent depletion   Medications: Outpatient Medications Prior to Visit  Medication Sig   albuterol (VENTOLIN HFA) 108 (90 Base) MCG/ACT inhaler Inhale 2 puffs into the lungs every 6 (six) hours as needed for wheezing or shortness of breath.   aspirin EC 81 MG tablet Take 81 mg by mouth daily.   atorvastatin (LIPITOR) 40 MG tablet Take 40 mg by mouth at bedtime.   Calcium-Magnesium-Vitamin D (CALCIUM 500 PO) Take 1 tablet by mouth daily.   cyproheptadine (PERIACTIN) 4 MG tablet Take 4 mg by mouth in the morning, at noon, in the evening, and at bedtime.   enoxaparin (LOVENOX) 30 MG/0.3ML injection Inject 30 mg into the skin every 12 (twelve) hours.   hydrOXYzine  (ATARAX) 25 MG tablet Take 25 mg by mouth every 6 (six) hours as needed.   lidocaine 4 % Place 1 patch onto the skin daily.   mirtazapine (REMERON) 45 MG tablet Take 45 mg by mouth at bedtime.   Multiple Vitamin (MULTI-DAY PO) Take 1 tablet by mouth daily.   polyethylene glycol (MIRALAX / GLYCOLAX) 17 g packet Take 17 g by mouth daily as needed.   senna-docusate (SENOKOT-S) 8.6-50 MG tablet Take 1 tablet by mouth 2 (two) times daily.   SUMAtriptan (IMITREX) 100 MG tablet Take 1 tablet (100 mg total) by mouth as needed. May repeat in 2 hours if headache persists or recurs.   thiamine (VITAMIN B1) 100 MG tablet Take by mouth.   Tiotropium Bromide Monohydrate (SPIRIVA RESPIMAT) 1.25 MCG/ACT AERS Inhale 2 puffs into the lungs daily.   traZODone (DESYREL) 100 MG tablet Take 100 mg by mouth at bedtime.   Venlafaxine HCl 225 MG TB24 Take 1 tablet by mouth every morning.   VITAMIN D3 1.25 MG (50000 UT) capsule Take 50,000 Units by mouth once a week.   OLANZapine (ZYPREXA) 10 MG tablet Take 10 mg by mouth at bedtime. (Patient not taking:  Reported on 09/03/2022)   zolpidem (AMBIEN) 10 MG tablet Take 10 mg by mouth at bedtime as needed. (Patient not taking: Reported on 09/03/2022)   [DISCONTINUED] cyproheptadine (PERIACTIN) 4 MG tablet TAKE 1/2 (ONE-HALF) TABLET BY MOUTH 4 TIMES DAILY FOR 7 DAYS;THEN 1 TAB 4 TIMES DAILY FOR 7 DAYS; THEN 1.5 TABS 4 TIMES DAILY FOR 7 DAYS; THEN 2 TABS 4 TIMES DAILY THEREAFTER FOR APPETITE STIMULATION (Patient not taking: Reported on 09/03/2022)   [DISCONTINUED] nicotine polacrilex (COMMIT) 2 MG lozenge Take 2 mg by mouth as needed. (Patient not taking: Reported on 09/03/2022)   No facility-administered medications prior to visit.    Review of Systems  Constitutional:  Negative for fatigue.  Eyes:  Negative for visual disturbance.  Cardiovascular:  Positive for leg swelling.  Neurological:  Positive for weakness, numbness (in feet, not new) and headaches (she has a history of  migraines but these became worse in rehab). Negative for dizziness, seizures, syncope and light-headedness.       Objective    Ht 5\' 7"  (1.702 m)   BMI 13.66 kg/m    Physical Exam Vitals reviewed.  Constitutional:      General: She is awake.     Appearance: Normal appearance. She is well-developed and well-groomed.  HENT:     Head: Normocephalic and atraumatic.  Cardiovascular:     Rate and Rhythm: Normal rate and regular rhythm.     Pulses: Normal pulses.          Radial pulses are 2+ on the right side and 2+ on the left side.     Heart sounds: Normal heart sounds. No murmur heard.    No friction rub. No gallop.     Comments: Bilateral pitting edema to level of mid shin  Pulmonary:     Effort: Pulmonary effort is normal.     Breath sounds: Decreased breath sounds present.  Musculoskeletal:     Cervical back: Normal range of motion.     Right lower leg: 2+ Pitting Edema present.     Left lower leg: 2+ Pitting Edema present.  Neurological:     General: No focal deficit present.     Mental Status: She is alert and oriented to person, place, and time. Mental status is at baseline.     GCS: GCS eye subscore is 4. GCS verbal subscore is 5. GCS motor subscore is 6.     Cranial Nerves: Cranial nerves 2-12 are intact. No cranial nerve deficit, dysarthria or facial asymmetry.     Motor: Motor function is intact. No weakness or tremor.  Psychiatric:        Behavior: Behavior is cooperative.       No results found for any visits on 09/03/22.  Assessment & Plan      Return in about 2 weeks (around 09/17/2022) for recheck sodium levels .      Problem List Items Addressed This Visit   None Visit Diagnoses     Hyponatremia    -  Primary Unsure of chronicity, new concern Patient was informed today by rehab facility that her sodium was 125 and she needed to see her PCP for evaluation and management Will repeat CMP, CBC today for evaluation She is taking Lasix PRN for lower  extremity edema and is on Venlafaxine  Will recheck CBC, CMP for trending Recommend eating regularly and drinking electrolyte replacement beverages instead of water to assist with supplementation We discussed adding small amount of salt to food to further  assist with symptoms Lab results to further guide management once available Follow up with PCP in 2 weeks and recommend she bring all medications with her to update med list     Relevant Orders   COMPLETE METABOLIC PANEL WITH GFR   CBC w/Diff/Platelet        Return in about 2 weeks (around 09/17/2022) for recheck sodium levels .   I, Gregg Holster E Hilary Milks, PA-C, have reviewed all documentation for this visit. The documentation on 09/03/22 for the exam, diagnosis, procedures, and orders are all accurate and complete.   Talitha Givens, MHS, PA-C [facility]

## 2022-09-03 NOTE — Telephone Encounter (Signed)
Patient would like her pharmacy to be [facility] in [place].

## 2022-09-04 ENCOUNTER — Encounter: Payer: Self-pay | Admitting: Physician Assistant

## 2022-09-04 ENCOUNTER — Ambulatory Visit: Payer: Self-pay | Admitting: *Deleted

## 2022-09-04 DIAGNOSIS — F329 Major depressive disorder, single episode, unspecified: Secondary | ICD-10-CM | POA: Diagnosis not present

## 2022-09-04 DIAGNOSIS — I69354 Hemiplegia and hemiparesis following cerebral infarction affecting left non-dominant side: Secondary | ICD-10-CM | POA: Diagnosis not present

## 2022-09-04 DIAGNOSIS — Z20822 Contact with and (suspected) exposure to covid-19: Secondary | ICD-10-CM | POA: Diagnosis not present

## 2022-09-04 DIAGNOSIS — E43 Unspecified severe protein-calorie malnutrition: Secondary | ICD-10-CM | POA: Diagnosis not present

## 2022-09-04 DIAGNOSIS — E8770 Fluid overload, unspecified: Secondary | ICD-10-CM | POA: Diagnosis not present

## 2022-09-04 DIAGNOSIS — R0600 Dyspnea, unspecified: Secondary | ICD-10-CM | POA: Diagnosis not present

## 2022-09-04 DIAGNOSIS — F32A Depression, unspecified: Secondary | ICD-10-CM | POA: Diagnosis not present

## 2022-09-04 DIAGNOSIS — R630 Anorexia: Secondary | ICD-10-CM | POA: Diagnosis not present

## 2022-09-04 DIAGNOSIS — J90 Pleural effusion, not elsewhere classified: Secondary | ICD-10-CM | POA: Diagnosis not present

## 2022-09-04 DIAGNOSIS — E785 Hyperlipidemia, unspecified: Secondary | ICD-10-CM | POA: Diagnosis not present

## 2022-09-04 DIAGNOSIS — R918 Other nonspecific abnormal finding of lung field: Secondary | ICD-10-CM | POA: Diagnosis not present

## 2022-09-04 DIAGNOSIS — E871 Hypo-osmolality and hyponatremia: Secondary | ICD-10-CM | POA: Diagnosis not present

## 2022-09-04 DIAGNOSIS — I6521 Occlusion and stenosis of right carotid artery: Secondary | ICD-10-CM | POA: Diagnosis not present

## 2022-09-04 DIAGNOSIS — E8779 Other fluid overload: Secondary | ICD-10-CM | POA: Diagnosis not present

## 2022-09-04 DIAGNOSIS — J811 Chronic pulmonary edema: Secondary | ICD-10-CM | POA: Diagnosis not present

## 2022-09-04 DIAGNOSIS — I959 Hypotension, unspecified: Secondary | ICD-10-CM | POA: Diagnosis not present

## 2022-09-04 DIAGNOSIS — Z7982 Long term (current) use of aspirin: Secondary | ICD-10-CM | POA: Diagnosis not present

## 2022-09-04 DIAGNOSIS — I5030 Unspecified diastolic (congestive) heart failure: Secondary | ICD-10-CM | POA: Diagnosis not present

## 2022-09-04 DIAGNOSIS — J449 Chronic obstructive pulmonary disease, unspecified: Secondary | ICD-10-CM | POA: Diagnosis not present

## 2022-09-04 DIAGNOSIS — D649 Anemia, unspecified: Secondary | ICD-10-CM | POA: Diagnosis not present

## 2022-09-04 DIAGNOSIS — E876 Hypokalemia: Secondary | ICD-10-CM | POA: Diagnosis not present

## 2022-09-04 DIAGNOSIS — R627 Adult failure to thrive: Secondary | ICD-10-CM | POA: Diagnosis not present

## 2022-09-04 DIAGNOSIS — F17210 Nicotine dependence, cigarettes, uncomplicated: Secondary | ICD-10-CM | POA: Diagnosis not present

## 2022-09-04 DIAGNOSIS — J849 Interstitial pulmonary disease, unspecified: Secondary | ICD-10-CM | POA: Diagnosis not present

## 2022-09-04 DIAGNOSIS — F1090 Alcohol use, unspecified, uncomplicated: Secondary | ICD-10-CM | POA: Diagnosis not present

## 2022-09-04 DIAGNOSIS — R0609 Other forms of dyspnea: Secondary | ICD-10-CM | POA: Diagnosis not present

## 2022-09-04 DIAGNOSIS — R54 Age-related physical debility: Secondary | ICD-10-CM | POA: Diagnosis not present

## 2022-09-04 DIAGNOSIS — K5900 Constipation, unspecified: Secondary | ICD-10-CM | POA: Diagnosis not present

## 2022-09-04 DIAGNOSIS — Z681 Body mass index (BMI) 19 or less, adult: Secondary | ICD-10-CM | POA: Diagnosis not present

## 2022-09-04 DIAGNOSIS — R9431 Abnormal electrocardiogram [ECG] [EKG]: Secondary | ICD-10-CM | POA: Diagnosis not present

## 2022-09-04 DIAGNOSIS — R079 Chest pain, unspecified: Secondary | ICD-10-CM | POA: Diagnosis not present

## 2022-09-04 DIAGNOSIS — R0602 Shortness of breath: Secondary | ICD-10-CM | POA: Diagnosis not present

## 2022-09-04 DIAGNOSIS — G4700 Insomnia, unspecified: Secondary | ICD-10-CM | POA: Diagnosis not present

## 2022-09-04 LAB — CBC WITH DIFFERENTIAL/PLATELET
Basophils Absolute: 0 10*3/uL (ref 0.0–0.2)
Basos: 0 %
EOS (ABSOLUTE): 0.2 10*3/uL (ref 0.0–0.4)
Eos: 2 %
Hematocrit: 26.7 % — ABNORMAL LOW (ref 34.0–46.6)
Hemoglobin: 8.5 g/dL — ABNORMAL LOW (ref 11.1–15.9)
Immature Grans (Abs): 0 10*3/uL (ref 0.0–0.1)
Immature Granulocytes: 0 %
Lymphocytes Absolute: 0.7 10*3/uL (ref 0.7–3.1)
Lymphs: 8 %
MCH: 28.6 pg (ref 26.6–33.0)
MCHC: 31.8 g/dL (ref 31.5–35.7)
MCV: 90 fL (ref 79–97)
Monocytes Absolute: 0.6 10*3/uL (ref 0.1–0.9)
Monocytes: 6 %
Neutrophils Absolute: 7.4 10*3/uL — ABNORMAL HIGH (ref 1.4–7.0)
Neutrophils: 84 %
Platelets: 411 10*3/uL (ref 150–450)
RBC: 2.97 x10E6/uL — ABNORMAL LOW (ref 3.77–5.28)
RDW: 14.5 % (ref 11.7–15.4)
WBC: 8.9 10*3/uL (ref 3.4–10.8)

## 2022-09-04 LAB — COMPREHENSIVE METABOLIC PANEL
ALT: 20 IU/L (ref 0–32)
AST: 21 IU/L (ref 0–40)
Albumin/Globulin Ratio: 1.2 (ref 1.2–2.2)
Albumin: 3.5 g/dL — ABNORMAL LOW (ref 3.9–4.9)
Alkaline Phosphatase: 185 IU/L — ABNORMAL HIGH (ref 44–121)
BUN/Creatinine Ratio: 12 (ref 12–28)
BUN: 6 mg/dL — ABNORMAL LOW (ref 8–27)
Bilirubin Total: <0.2 mg/dL (ref 0.0–1.2)
CO2: 31 mmol/L — ABNORMAL HIGH (ref 20–29)
Calcium: 9.1 mg/dL (ref 8.7–10.3)
Chloride: 85 mmol/L — ABNORMAL LOW (ref 96–106)
Creatinine, Ser: 0.49 mg/dL — ABNORMAL LOW (ref 0.57–1.00)
Globulin, Total: 3 g/dL (ref 1.5–4.5)
Glucose: 84 mg/dL (ref 70–99)
Potassium: 3.3 mmol/L — ABNORMAL LOW (ref 3.5–5.2)
Sodium: 132 mmol/L — ABNORMAL LOW (ref 134–144)
Total Protein: 6.5 g/dL (ref 6.0–8.5)
eGFR: 101 mL/min/{1.73_m2} (ref >59)

## 2022-09-04 NOTE — Telephone Encounter (Signed)
Call from [place], [place] from [place]: patient short of breath at rest, crackles heard lung sounds Symptoms: SOB at rest, cough unable to cough up mucus. Last seen in [place] yesterday  Frequency: today  per LPN family reports yesterday  Pertinent Negatives: Patient denies chest pain no difficulty breathing  Disposition: [x] ED /[] Urgent Care (no appt availability in office) / [] Appointment(In office/virtual)/ []  [facility]/ [] Home Care/ [] Refused Recommended Disposition /[]  Mobile Bus/ []  Follow-up with PCP Additional Notes:   Recommended ED due to worsening sx and SOB at rest.     Reason for Disposition  [1] MODERATE difficulty breathing (e.g., speaks in phrases, SOB even at rest, pulse 100-120) AND [2] NEW-onset or WORSE than normal  Answer Assessment - Initial Assessment Questions 1. RESPIRATORY STATUS: "Describe your breathing?" (e.g., wheezing, shortness of breath, unable to speak, severe coughing)      SOB at rest, LPN from [facility] reports crackles in lungs  2. ONSET: "When did this breathing problem begin?"      Noted today but family reports she was having issues yesterday during OV 3. PATTERN "Does the difficult breathing come and go, or has it been constant since it started?"      At rest  4. SEVERITY: "How bad is your breathing?" (e.g., mild, moderate, severe)    - MILD: No SOB at rest, mild SOB with walking, speaks normally in sentences, can lie down, no retractions, pulse < 100.    - MODERATE: SOB at rest, SOB with minimal exertion and prefers to sit, cannot lie down flat, speaks in phrases, mild retractions, audible wheezing, pulse 100-120.    - SEVERE: Very SOB at rest, speaks in single words, struggling to breathe, sitting hunched forward, retractions, pulse > 120      SOB at rest  5. RECURRENT SYMPTOM: "Have you had difficulty breathing before?" If Yes, ask: "When was the last time?" and "What happened that time?"      Yes  6. CARDIAC  HISTORY: "Do you have any history of heart disease?" (e.g., heart attack, angina, bypass surgery, angioplasty)      See hx  7. LUNG HISTORY: "Do you have any history of lung disease?"  (e.g., pulmonary embolus, asthma, emphysema)     See hx  8. CAUSE: "What do you think is causing the breathing problem?"      Na  9. OTHER SYMPTOMS: "Do you have any other symptoms? (e.g., dizziness, runny nose, cough, chest pain, fever)     Cough but can not cough up anything 10. O2 SATURATION MONITOR:  "Do you use an oxygen saturation monitor (pulse oximeter) at home?" If Yes, ask: "What is your reading (oxygen level) today?" "What is your usual oxygen saturation reading?" (e.g., 95%)       na 11. PREGNANCY: "Is there any chance you are pregnant?" "When was your last menstrual period?"       na 12. TRAVEL: "Have you traveled out of the country in the last month?" (e.g., travel history, exposures)       na  Protocols used: Breathing Difficulty-A-AH

## 2022-09-05 DIAGNOSIS — R0609 Other forms of dyspnea: Secondary | ICD-10-CM | POA: Insufficient documentation

## 2022-09-05 NOTE — Progress Notes (Signed)
CBC demonstrates rather significant anemia. Has she had any evidence of bleeding lately? Dark colored stools, blood in bowel movements, recent injuries? I recommend follow up in office a bit sooner to evaluate for potential causes. Several electrolytes are low- Sodium, potassium, chloride and albumin. Sodium appears to be improving, it's now at 132. I recommend that she continues to eat regularly and try to consume plenty of protein. Continue to use the electrolyte supplementation drinks, Gatorade, Pedialyte, etc. I recommend we recheck these labs in about one week to make sure they are continuing to improve.

## 2022-09-07 DIAGNOSIS — R630 Anorexia: Secondary | ICD-10-CM | POA: Insufficient documentation

## 2022-09-07 DIAGNOSIS — R627 Adult failure to thrive: Secondary | ICD-10-CM | POA: Insufficient documentation

## 2022-09-10 ENCOUNTER — Telehealth: Payer: Self-pay

## 2022-09-10 NOTE — Transitions of Care (Post Inpatient/ED Visit) (Signed)
   09/10/2022  Name: Carolyn Dunlap MRN: [id number] DOB: March 03, 1951  Today's TOC FU Call Status: Today's TOC FU Call Status:: Successful TOC FU Call Competed TOC FU Call Complete Date: 09/10/22  Transition Care Management Follow-up Telephone Call Date of Discharge: 09/07/22 Discharge Facility: Other ([place]) Name of Other ([place]) Discharge Facility: [facility] Type of Discharge: Inpatient Admission Primary Inpatient Discharge Diagnosis:: dyspnea How have you been since you were released from the hospital?: Better  Items Reviewed: Did you receive and understand the discharge instructions provided?: Yes Medications obtained and verified?: Yes (Medications Reviewed) Any new allergies since your discharge?: No Dietary orders reviewed?: Yes Do you have support at home?: Yes People in Home: spouse  Home Care and Equipment/Supplies: [place] Ordered?: NA Any new equipment or medical supplies ordered?: NA  Functional Questionnaire: Do you need assistance with bathing/showering or dressing?: No Do you need assistance with meal preparation?: No Do you need assistance with eating?: No Do you have difficulty maintaining continence: No Do you need assistance with getting out of bed/getting out of a chair/moving?: No Do you have difficulty managing or taking your medications?: No  Follow up appointments reviewed: PCP Follow-up appointment confirmed?: Yes Date of PCP follow-up appointment?: 09/12/22 Follow-up Provider: [facility] Follow-up appointment confirmed?: NA Do you need transportation to your follow-up appointment?: No Do you understand care options if your condition(s) worsen?: Yes-patient verbalized understanding    [place], [place] Direct Dial [phone]

## 2022-09-12 ENCOUNTER — Ambulatory Visit (INDEPENDENT_AMBULATORY_CARE_PROVIDER_SITE_OTHER): Payer: Medicare Other | Admitting: Internal Medicine

## 2022-09-12 ENCOUNTER — Encounter: Payer: Self-pay | Admitting: Internal Medicine

## 2022-09-12 VITALS — BP 128/72 | HR 83 | Ht 67.0 in | Wt 95.0 lb

## 2022-09-12 DIAGNOSIS — D509 Iron deficiency anemia, unspecified: Secondary | ICD-10-CM

## 2022-09-12 DIAGNOSIS — F17200 Nicotine dependence, unspecified, uncomplicated: Secondary | ICD-10-CM | POA: Diagnosis not present

## 2022-09-12 DIAGNOSIS — F332 Major depressive disorder, recurrent severe without psychotic features: Secondary | ICD-10-CM

## 2022-09-12 DIAGNOSIS — J984 Other disorders of lung: Secondary | ICD-10-CM

## 2022-09-12 DIAGNOSIS — G43009 Migraine without aura, not intractable, without status migrainosus: Secondary | ICD-10-CM | POA: Diagnosis not present

## 2022-09-12 MED ORDER — SUMATRIPTAN SUCCINATE 100 MG PO TABS
100.0000 mg | ORAL_TABLET | ORAL | 2 refills | Status: DC | PRN
Start: 2022-09-12 — End: 2024-01-14

## 2022-09-12 NOTE — Assessment & Plan Note (Signed)
Continue imitrex PRN - Rx refilled today

## 2022-09-12 NOTE — Assessment & Plan Note (Signed)
Trying to cut back Husband plans to no longer purchase cigarettes

## 2022-09-12 NOTE — Assessment & Plan Note (Signed)
Cause uncertain - smoking likely contributing Rheum work up negative Continue Spiriva and PRN Albuterol See Pulmonary as planned

## 2022-09-12 NOTE — Assessment & Plan Note (Signed)
Continue Remeron and Trazodone Plans to transfer PCP to [place] - appointment in June

## 2022-09-12 NOTE — Progress Notes (Signed)
Date:  09/12/2022   Name:  Carolyn Dunlap   DOB:  September 23, 1950   MRN:  [id number]   Chief Complaint: Hospitalization Follow-up Hospital follow up. [facility]  09/04/22 - 09/07/22.  TOC call done 09/10/22.   Carolyn Dunlap is a 72 y.o. female with a PMHx of recent prolonged admission (07/16/22-08/06/22) for right MCA stroke and right femur fracture, ?COPD vs asthma, that presented to [facility] with concern for crackles in her lungs per [place] and dyspnea on exertion.  Dyspnea on exertion  Volume overload  Loculated L Pleural Effusion Pt with chronic shortness of breath on exertion that is worse ISO recent prolonged admission. VSS, afebrile, on RA. Lungs w/ bibasilar crackles. BNP 120 on admit. Adequate UOP w/ IV Lasix. Patient also has a reported COPD diagnosis, however PFTs in 2022 with mod/severe restrictive disease without significant bronchodilator response. CT Chest 3/27 with moderate left loculated effusion. She has notably had a small L effusion demonstrated on CT chests dating back to 2021 at [facility] and on CT chest at [facility] this February. Associated round atelectasis is present favoring chronic effusion. Lower concern for infection at this point so abx were held. There is concern for underlying malignancy. Bedside POCUS with pocket amenable for sampling however Pulm did not recommend for thoracentesis given chronic nature and pleural thickening. To workup ILD, a rheumatologic workup was initiated and pending at time of discharge. AFB sputum cultures were also pending collection. She tolerated inpatient diuresis well and was eventually discharged with Pulm follow up for PFTs.   >>> ENA, ANA and Anti-neutrophilic Ab all negative.  Per patient report a TB skin was negative.  Failure to thrive  Anorexia Patient with unintentional weight loss over the past several years, notably was seen in the ED for similar concerns in July 2022. 94 lbs today with 14 BMI. At that time, she believed her weight loss to  be 2/2 chronic depression. PHQ9 21 today. Currently supplementing with Ensure. On mirtazapine and cyproheptadine for appetite stimulation outpatient. Presentation is certainly concerning for underlying malignancy with components of ongoing tobacco/alcohol use +/- consideration of medication effect from Effexor and anhedonia from MDD. Last colonoscopy in 2019 demonstrated a cecal polyp and sigmoid colon polyps with repeat interval in 3-5 years. Screening mammography in 2020 Bi-RADS1.  Tobacco Use Disorder  AUD 5 cigarettes/day for the past 5 days. Previously was using ~1 pack/day for 20 years. Last drink 3/24. No concern for withdrawal currently  Anemia Hgb 6.9 yesterday evening. Rebounded to 7.8 w/o intervention. No s/s of ongoing bleeding. Suspect will bounce Likely 2/2 recent surgery, volume overload, chronic disease, iatrogenic anemia, and sluggish bone barrow. Hgb 11.2 prior to surgery in Feb, decreased to 7-8's thereafter. Could be contributing to underlying DOE. Folate / B12 WNL in [place] in Feb. Reticulocyte count 4%.  Repeat B12 and folate normal.  Chronic Problems: Right MCA stroke/100% right carotid occlusion: On ASA and statin. Hyponatremia: chronic in nature. Likely a result of volume, poor po, medications, and alcohol use. Asymptomatic. Normal serum Osm. Continue to observe.  Nutrition Assessment: Severe Protein-Calorie Malnutrition in the context of chronic illness (09/05/22 1518) Energy Intake: < or equal to 75% of estimated energy requirement for > or equal to 1 month Fat Loss: Severe Muscle Loss: Severe Malnutrition Score: 3  Outpatient Provider Follow Up Issues: See above  Touchbase with Outpatient Provider: Warm Handoff: Not completed secondary to not available for contact.  Procedures: None ______________________________________________________________________ Discharge Medications:  Your Medication  List   STOP taking these medications  cyproheptadine 4 mg  tablet Commonly known as: PERIACTIN  enoxaparin 150 mg/mL injection Commonly known as: LOVENOX  enoxaparin 30 mg/0.3 mL Syrg Commonly known as: LOVENOX  hydrOXYzine 25 MG tablet Commonly known as: ATARAX  venlafaxine besylate 112.5 mg Tr24     START taking these medications  furosemide 20 MG tablet Commonly known as: LASIX Take 1 tablet (20 mg total) by mouth daily.  multivitamin with folic acid [id number] mcg Tab tablet Take 1 tablet by mouth daily.  thiamine mononitrate (vit B1) 100 mg Tab tablet Take 1 tablet (100 mg total) by mouth daily.     CHANGE how you take these medications  mirtazapine 45 MG tablet Commonly known as: REMERON Take 1 tablet (45 mg total) by mouth nightly. What changed: Another medication with the same name was removed. Continue taking this medication, and follow the directions you see here.  SPIRIVA RESPIMAT 2.5 mcg/actuation inhalation mist Generic drug: tiotropium bromide Inhale 2 puffs daily. What changed: Another medication with the same name was removed. Continue taking this medication, and follow the directions you see here.  traZODone 100 MG tablet Commonly known as: DESYREL Take 1 tablet (100 mg total) by mouth nightly. What changed: Another medication with the same name was removed. Continue taking this medication, and follow the directions you see here.     CONTINUE taking these medications  albuterol 90 mcg/actuation inhaler Commonly known as: PROVENTIL HFA;VENTOLIN HFA Inhale 2 puffs every six (6) hours as needed for wheezing.  aspirin 81 MG tablet Commonly known as: ECOTRIN Take 1 tablet (81 mg total) by mouth daily.  atorvastatin 40 MG tablet Commonly known as: LIPITOR Take 1 tablet (40 mg total) by mouth daily.  cholecalciferol (vitamin D3-1,250 mcg (50,000 unit)) 1,250 mcg (50,000 unit) Tab tablet Take 1 tablet (1,250 mcg total) by mouth Every Wednesday.  magnesium hydroxide 400 mg/5 mL Susp Generic drug:  magnesium hydroxide Take 15 mL by mouth daily as needed.  NICORETTE 2 mg gum Generic drug: nicotine polacrilex Apply 1 each (2 mg total) to cheek every two (2) hours as needed for smoking cessation.  polyethylene glycol 17 gram packet Commonly known as: MIRALAX Take 17 g by mouth daily.  SENNA 8.6 mg tablet Generic drug: senna Take 1 tablet by mouth two (2) times a day.  sumatriptan 50 MG tablet Commonly known as: IMITREX Take 1 tablet (50 mg total) by mouth two (2) times a day as needed for migraine.  venlafaxine 75 MG 24 hr capsule Commonly known as: EFFEXOR-XR Take 3 capsules (225 mg total) by mouth every morning.  zolpidem 10 mg tablet Commonly known as: AMBIEN Take 1 tablet (10 mg total) by mouth nightly.    HPI Medications reviewed and reconciled.  Since being home she has done okay" she is mostly limited by her shortness of breath which continues.  Her husband accompanies her today and states that she still is eating very poorly.  She continues 1 Ensure drink daily.  She is still having right hip pain status post surgery for fracture.  Has a pending endocrinology appointment to discuss osteoporosis treatment.  She has an upcoming pulmonary appointment and may for further evaluation of her lung disease.  At this point the cause is undefined but a rheumatologic workup was negative along with negative TB skin testing.  She gets some relief from Spiriva daily and as needed albuterol.  She continues to smoke cigarettes but states that she does not  even have the breath to take a draw.  Her husband also smokes and he plans to stop purchasing cigarettes so that they can both quit.  Home health RN and PT visit should start shortly.  I have encouraged her to work with them for strengthening and to use albuterol prior to exercise to help with her shortness of breath.  She is scheduled to transfer her primary care to [place].  That appointment is in early June.  Review of  labs from the hospital show chronic anemia with low iron, probably moderately chronic hyponatremia, normal renal function and negative rheumatologic workup.  Lab Results  Component Value Date   NA 132 (L) 09/03/2022   K 3.3 (L) 09/03/2022   CO2 31 (H) 09/03/2022   GLUCOSE 84 09/03/2022   BUN 6 (L) 09/03/2022   CREATININE 0.49 (L) 09/03/2022   CALCIUM 9.1 09/03/2022   EGFR 101 09/03/2022   GFRNONAA 90 01/06/2020   Lab Results  Component Value Date   CHOL 169 10/10/2021   HDL 93 10/10/2021   LDLCALC 63 10/10/2021   TRIG 65 10/10/2021   CHOLHDL 1.8 10/10/2021   Lab Results  Component Value Date   TSH 2.700 10/10/2021   Lab Results  Component Value Date   HGBA1C 5.4 05/26/2015   Lab Results  Component Value Date   WBC 8.9 09/03/2022   HGB 8.5 (L) 09/03/2022   HCT 26.7 (L) 09/03/2022   MCV 90 09/03/2022   PLT 411 09/03/2022   Lab Results  Component Value Date   ALT 20 09/03/2022   AST 21 09/03/2022   ALKPHOS 185 (H) 09/03/2022   BILITOT <0.2 09/03/2022   No results found for: "25OHVITD2", "25OHVITD3", "VD25OH"   Review of Systems  Constitutional:  Positive for appetite change and fatigue. Negative for diaphoresis, fever and unexpected weight change.  HENT:  Negative for trouble swallowing.   Respiratory:  Positive for cough, chest tightness and shortness of breath.   Cardiovascular:  Positive for leg swelling. Negative for chest pain and palpitations.  Gastrointestinal:  Negative for abdominal pain, constipation and diarrhea.  Musculoskeletal:  Positive for arthralgias and gait problem.  Neurological:  Positive for headaches. Negative for dizziness and light-headedness.  Psychiatric/Behavioral:  Positive for dysphoric mood and sleep disturbance. The patient is nervous/anxious.     Patient Active Problem List   Diagnosis Date Noted   Iron deficiency anemia 09/12/2022   Chronic lung disease 09/12/2022   Protein-calorie malnutrition, moderate 06/14/2020    Pleural effusion, left 06/14/2020   COPD mixed type 06/14/2020   Aortic atherosclerosis 01/06/2020   Disorder of thyroid 08/04/2019   Frequent falls 07/24/2018   Tremor, unspecified 06/25/2018   Special screening for malignant neoplasms, colon    Tobacco use disorder 05/29/2016   Migraine without aura and without status migrainosus, not intractable 03/28/2015   GERD without esophagitis 03/28/2015   Osteoporosis 03/28/2015   Insomnia 03/28/2015   Moderately severe recurrent major depression 03/28/2015   Anxiety disorder 03/28/2015    Allergies  Allergen Reactions   Zoloft [Sertraline Hcl]     diaphoresis   Adhesive [Tape] Rash    Band-aids   Penicillins Rash    Past Surgical History:  Procedure Laterality Date   AUGMENTATION MAMMAPLASTY Bilateral [place]   CATARACT EXTRACTION  2002   COLONOSCOPY WITH PROPOFOL N/A 07/10/2017   Procedure: COLONOSCOPY WITH PROPOFOL;  Surgeon: Lin Landsman, MD;  Location: [place];  Service: Endoscopy;  Laterality: N/A;   POLYPECTOMY N/A 07/10/2017   Procedure: POLYPECTOMY;  Surgeon: Lin Landsman, MD;  Location: [place];  Service: Endoscopy;  Laterality: N/A;    Social History   Tobacco Use   Smoking status: Every Day    Packs/day: 0.50    Years: 30.00    Additional pack years: 0.00    Total pack years: 15.00    Types: Cigarettes   Smokeless tobacco: Never   Tobacco comments:    0.5PPD  10/25/2020  Vaping Use   Vaping Use: Never used  Substance Use Topics   Alcohol use: Not Currently   Drug use: No     Medication list has been reviewed and updated.  Current Meds  Medication Sig   albuterol (VENTOLIN HFA) 108 (90 Base) MCG/ACT inhaler Inhale 2 puffs into the lungs every 6 (six) hours as needed for wheezing or shortness of breath.   aspirin EC 81 MG tablet Take 81 mg by mouth daily.   atorvastatin (LIPITOR) 40 MG tablet Take 40 mg by mouth at bedtime.    Calcium-Magnesium-Vitamin D (CALCIUM 500 PO) Take 1 tablet by mouth daily.   ferrous sulfate 325 (65 FE) MG tablet Take 325 mg by mouth daily with breakfast.   furosemide (LASIX) 20 MG tablet Take 20 mg by mouth daily as needed for edema.   magnesium oxide (MAG-OX) 400 (240 Mg) MG tablet Take 400 mg by mouth daily.   mirtazapine (REMERON) 45 MG tablet Take 45 mg by mouth at bedtime.   Multiple Vitamin (MULTI-DAY PO) Take 1 tablet by mouth daily.   nicotine polacrilex (NICORETTE) 2 MG gum Take 2 mg by mouth every 4 (four) hours while awake.   polyethylene glycol (MIRALAX / GLYCOLAX) 17 g packet Take 17 g by mouth daily as needed.   senna-docusate (SENOKOT-S) 8.6-50 MG tablet Take 1 tablet by mouth 2 (two) times daily.   thiamine (VITAMIN B1) 100 MG tablet Take by mouth.   Tiotropium Bromide Monohydrate (SPIRIVA RESPIMAT) 1.25 MCG/ACT AERS Inhale 2 puffs into the lungs daily.   traZODone (DESYREL) 100 MG tablet Take 100 mg by mouth at bedtime.   Venlafaxine HCl 225 MG TB24 Take 1 tablet by mouth every morning.   zolpidem (AMBIEN) 10 MG tablet Take 10 mg by mouth at bedtime as needed.   [DISCONTINUED] hydrOXYzine (ATARAX) 25 MG tablet Take 25 mg by mouth every 6 (six) hours as needed.   [DISCONTINUED] SUMAtriptan (IMITREX) 100 MG tablet Take 1 tablet (100 mg total) by mouth as needed. May repeat in 2 hours if headache persists or recurs.       09/12/2022    2:03 PM 09/03/2022    2:23 PM 10/10/2021   10:00 AM 02/02/2021   10:47 AM  GAD 7 : Generalized Anxiety Score  Nervous, Anxious, on Edge 3 3 3 3   Control/stop worrying 3 3 3 3   Worry too much - different things 3 3 3 3   Trouble relaxing 3 3 3 3   Restless 3 3 1 1   Easily annoyed or irritable 0 0 0 1  Afraid - awful might happen 3 3 3 3   Total GAD 7 Score 18 18 16 17   Anxiety Difficulty Extremely difficult Extremely difficult Extremely difficult        09/12/2022    2:03 PM 09/03/2022    2:23 PM 07/09/2022    2:10 PM  Depression screen  PHQ 2/9  Decreased Interest 3 3 3   Down, Depressed, Hopeless 3 3  3  PHQ - 2 Score 6 6 6   Altered sleeping 0 0 3  Tired, decreased energy 3 3 3   Change in appetite 3 3 3   Feeling bad or failure about yourself  3 3 3   Trouble concentrating 3 3 3   Moving slowly or fidgety/restless 3 3 0  Suicidal thoughts 0 0 0  PHQ-9 Score 21 21 21   Difficult doing work/chores Somewhat difficult Extremely dIfficult Very difficult    BP Readings from Last 3 Encounters:  09/12/22 128/72  09/03/22 100/64  10/10/21 108/70    Physical Exam Constitutional:      Appearance: She is ill-appearing.  Neck:     Vascular: No carotid bruit.  Cardiovascular:     Rate and Rhythm: Normal rate and regular rhythm.  Pulmonary:     Effort: Accessory muscle usage present.     Breath sounds: Examination of the right-lower field reveals wheezing. Examination of the left-lower field reveals wheezing. Decreased breath sounds and wheezing present.  Abdominal:     General: Abdomen is flat.     Tenderness: There is no abdominal tenderness.  Musculoskeletal:     Cervical back: Normal range of motion.  Lymphadenopathy:     Cervical: No cervical adenopathy.  Neurological:     Mental Status: She is alert.  Psychiatric:        Attention and Perception: Attention normal.        Mood and Affect: Affect is flat.        Speech: Speech normal.     Wt Readings from Last 3 Encounters:  09/12/22 95 lb (43.1 kg)  10/10/21 87 lb 3.2 oz (39.6 kg)  02/02/21 84 lb (38.1 kg)    BP 128/72   Pulse 83   Ht 5\' 7"  (1.702 m)   Wt 95 lb (43.1 kg)   SpO2 97%   BMI 14.88 kg/m   Assessment and Plan:  Problem List Items Addressed This Visit       Cardiovascular and Mediastinum   Migraine without aura and without status migrainosus, not intractable - Primary (Chronic)    Continue imitrex PRN - Rx refilled today      Relevant Medications   SUMAtriptan (IMITREX) 100 MG tablet     Respiratory   Chronic lung disease     Cause uncertain - smoking likely contributing Rheum work up negative Continue Spiriva and PRN Albuterol See Pulmonary as planned        Other   Iron deficiency anemia    Noted on recent hospital stay Also recent hip fracture with likely blood loss/iron def Continue Iron otc daily      Relevant Medications   ferrous sulfate 325 (65 FE) MG tablet   Moderately severe recurrent major depression (Chronic)    Continue Remeron and Trazodone Plans to transfer PCP to [place] - appointment in June      Tobacco use disorder (Chronic)    Trying to cut back Husband plans to no longer purchase cigarettes        No follow-ups on file.   Partially dictated using [place], any errors are not intentional.  Glean Hess, MD [place], [place]

## 2022-09-12 NOTE — Assessment & Plan Note (Signed)
Noted on recent hospital stay Also recent hip fracture with likely blood loss/iron def Continue Iron otc daily

## 2022-09-13 DIAGNOSIS — F332 Major depressive disorder, recurrent severe without psychotic features: Secondary | ICD-10-CM | POA: Diagnosis not present

## 2022-09-18 ENCOUNTER — Ambulatory Visit: Payer: Medicare Other | Admitting: Internal Medicine

## 2022-09-21 ENCOUNTER — Ambulatory Visit
Admission: RE | Admit: 2022-09-21 | Discharge: 2022-09-21 | Disposition: A | Discharge status: Home / Self Care | Payer: Medicare Other | Attending: Internal Medicine | Admitting: Internal Medicine

## 2022-09-21 ENCOUNTER — Other Ambulatory Visit
Admission: RE | Admit: 2022-09-21 | Discharge: 2022-09-21 | Disposition: A | Discharge status: Home / Self Care | Payer: Medicare Other | Source: Home / Self Care | Attending: Internal Medicine | Admitting: Internal Medicine

## 2022-09-21 ENCOUNTER — Ambulatory Visit: Payer: Self-pay

## 2022-09-21 ENCOUNTER — Ambulatory Visit (INDEPENDENT_AMBULATORY_CARE_PROVIDER_SITE_OTHER): Payer: Medicare Other | Admitting: Internal Medicine

## 2022-09-21 ENCOUNTER — Other Ambulatory Visit: Payer: Medicare Other

## 2022-09-21 ENCOUNTER — Ambulatory Visit
Admission: RE | Admit: 2022-09-21 | Discharge: 2022-09-21 | Disposition: A | Discharge status: Home / Self Care | Payer: Medicare Other | Source: Ambulatory Visit | Attending: Internal Medicine | Admitting: Internal Medicine

## 2022-09-21 ENCOUNTER — Encounter: Payer: Self-pay | Admitting: Internal Medicine

## 2022-09-21 VITALS — BP 126/76 | HR 90 | Ht 67.0 in | Wt 92.0 lb

## 2022-09-21 DIAGNOSIS — E871 Hypo-osmolality and hyponatremia: Secondary | ICD-10-CM | POA: Diagnosis not present

## 2022-09-21 DIAGNOSIS — J90 Pleural effusion, not elsewhere classified: Secondary | ICD-10-CM

## 2022-09-21 DIAGNOSIS — E440 Moderate protein-calorie malnutrition: Secondary | ICD-10-CM

## 2022-09-21 DIAGNOSIS — F17200 Nicotine dependence, unspecified, uncomplicated: Secondary | ICD-10-CM | POA: Diagnosis not present

## 2022-09-21 DIAGNOSIS — J984 Other disorders of lung: Secondary | ICD-10-CM | POA: Diagnosis not present

## 2022-09-21 DIAGNOSIS — R0602 Shortness of breath: Secondary | ICD-10-CM | POA: Diagnosis not present

## 2022-09-21 LAB — CBC WITH DIFFERENTIAL/PLATELET
Abs Immature Granulocytes: 0.02 10*3/uL (ref 0.00–0.07)
Basophils Absolute: 0.1 10*3/uL (ref 0.0–0.1)
Basophils Relative: 1 %
Eosinophils Absolute: 0.1 10*3/uL (ref 0.0–0.5)
Eosinophils Relative: 1 %
HCT: 30.4 % — ABNORMAL LOW (ref 36.0–46.0)
Hemoglobin: 9.4 g/dL — ABNORMAL LOW (ref 12.0–15.0)
Immature Granulocytes: 0 %
Lymphocytes Relative: 14 %
Lymphs Abs: 1 10*3/uL (ref 0.7–4.0)
MCH: 27.6 pg (ref 26.0–34.0)
MCHC: 30.9 g/dL (ref 30.0–36.0)
MCV: 89.4 fL (ref 80.0–100.0)
Monocytes Absolute: 0.3 10*3/uL (ref 0.1–1.0)
Monocytes Relative: 5 %
Neutro Abs: 5.8 10*3/uL (ref 1.7–7.7)
Neutrophils Relative %: 79 %
Platelets: 324 10*3/uL (ref 150–400)
RBC: 3.4 MIL/uL — ABNORMAL LOW (ref 3.87–5.11)
RDW: 16.7 % — ABNORMAL HIGH (ref 11.5–15.5)
WBC: 7.3 10*3/uL (ref 4.0–10.5)
nRBC: 0 % (ref 0.0–0.2)

## 2022-09-21 LAB — BASIC METABOLIC PANEL
Anion gap: 9 (ref 5–15)
BUN: 8 mg/dL (ref 8–23)
CO2: 33 mmol/L — ABNORMAL HIGH (ref 22–32)
Calcium: 9.3 mg/dL (ref 8.9–10.3)
Chloride: 87 mmol/L — ABNORMAL LOW (ref 98–111)
Creatinine, Ser: 0.55 mg/dL (ref 0.44–1.00)
GFR, Estimated: >60 mL/min (ref >60)
Glucose, Bld: 90 mg/dL (ref 70–99)
Potassium: 3.7 mmol/L (ref 3.5–5.1)
Sodium: 129 mmol/L — ABNORMAL LOW (ref 135–145)

## 2022-09-21 MED ORDER — IPRATROPIUM-ALBUTEROL 0.5-2.5 (3) MG/3ML IN SOLN
3.0000 mL | Freq: Four times a day (QID) | RESPIRATORY_TRACT | 0 refills | Status: DC | PRN
Start: 2022-09-21 — End: 2022-10-31

## 2022-09-21 NOTE — Telephone Encounter (Signed)
      Chief Complaint: Amy,OT, with [facility] with pt. Pt. Reports increased SOB and increased swelling to feet and ankles. Pt. Requests OV. Husband on phone, upset. States "every time someone comes to see her we have to have an appointment. She's getter better." Symptoms: Above  VS - temp 98.1, pulse 99, resp. 18, BP 121/82 O2 sat 94% Frequency: This week Pertinent Negatives: Patient denies  Disposition: [] ED /[] Urgent Care (no appt availability in office) / [x] Appointment(In office/virtual)/ []  [facility]/ [] Home Care/ [] Refused Recommended Disposition /[] [facility]/ []  Follow-up with PCP Additional Notes:   Reason for Disposition  [1] MILD difficulty breathing (e.g., minimal/no SOB at rest, SOB with walking, pulse <100) AND [2] NEW-onset or WORSE than normal  Answer Assessment - Initial Assessment Questions 1. RESPIRATORY STATUS: "Describe your breathing?" (e.g., wheezing, shortness of breath, unable to speak, severe coughing)      SOB 2. ONSET: "When did this breathing problem begin?"      This week 3. PATTERN "Does the difficult breathing come and go, or has it been constant since it started?"      On exertion 4. SEVERITY: "How bad is your breathing?" (e.g., mild, moderate, severe)    - MILD: No SOB at rest, mild SOB with walking, speaks normally in sentences, can lie down, no retractions, pulse < 100.    - MODERATE: SOB at rest, SOB with minimal exertion and prefers to sit, cannot lie down flat, speaks in phrases, mild retractions, audible wheezing, pulse 100-120.    - SEVERE: Very SOB at rest, speaks in single words, struggling to breathe, sitting hunched forward, retractions, pulse > 120      Mild 5. RECURRENT SYMPTOM: "Have you had difficulty breathing before?" If Yes, ask: "When was the last time?" and "What happened that time?"      Yes 6. CARDIAC HISTORY: "Do you have any history of heart disease?" (e.g., heart attack, angina, bypass surgery,  angioplasty)      No 7. LUNG HISTORY: "Do you have any history of lung disease?"  (e.g., pulmonary embolus, asthma, emphysema)     COPD 8. CAUSE: "What do you think is causing the breathing problem?"      Unsure 9. OTHER SYMPTOMS: "Do you have any other symptoms? (e.g., dizziness, runny nose, cough, chest pain, fever)     Increased swelling feet , ankle 10. O2 SATURATION MONITOR:  "Do you use an oxygen saturation monitor (pulse oximeter) at home?" If Yes, ask: "What is your reading (oxygen level) today?" "What is your usual oxygen saturation reading?" (e.g., 95%)       94% 11. PREGNANCY: "Is there any chance you are pregnant?" "When was your last menstrual period?"       No 12. TRAVEL: "Have you traveled out of the country in the last month?" (e.g., travel history, exposures)       No  Protocols used: Breathing Difficulty-A-AH

## 2022-09-21 NOTE — Progress Notes (Signed)
g   Date:  09/21/2022   Name:  Carolyn Dunlap   DOB:  21-Feb-1951   MRN:  [id number]   Chief Complaint: Shortness of Breath and Edema (Both feet, ongoing since feb, getting worse )  Shortness of Breath This is a chronic problem. The problem has been gradually worsening. Associated symptoms include headaches, leg swelling and wheezing. Pertinent negatives include no fever. Exacerbated by: smoking. She has tried steroid inhalers for the symptoms. The treatment provided mild relief. Her past medical history is significant for COPD.    Lab Results  Component Value Date   NA 132 (L) 09/03/2022   K 3.3 (L) 09/03/2022   CO2 31 (H) 09/03/2022   GLUCOSE 84 09/03/2022   BUN 6 (L) 09/03/2022   CREATININE 0.49 (L) 09/03/2022   CALCIUM 9.1 09/03/2022   EGFR 101 09/03/2022   GFRNONAA 90 01/06/2020   Lab Results  Component Value Date   CHOL 169 10/10/2021   HDL 93 10/10/2021   LDLCALC 63 10/10/2021   TRIG 65 10/10/2021   CHOLHDL 1.8 10/10/2021   Lab Results  Component Value Date   TSH 2.700 10/10/2021   Lab Results  Component Value Date   HGBA1C 5.4 05/26/2015   Lab Results  Component Value Date   WBC 8.9 09/03/2022   HGB 8.5 (L) 09/03/2022   HCT 26.7 (L) 09/03/2022   MCV 90 09/03/2022   PLT 411 09/03/2022   Lab Results  Component Value Date   ALT 20 09/03/2022   AST 21 09/03/2022   ALKPHOS 185 (H) 09/03/2022   BILITOT <0.2 09/03/2022   No results found for: "25OHVITD2", "25OHVITD3", "VD25OH"   Review of Systems  Constitutional:  Positive for fatigue. Negative for chills, diaphoresis and fever.  Respiratory:  Positive for shortness of breath and wheezing. Negative for chest tightness.   Cardiovascular:  Positive for leg swelling.  Musculoskeletal:  Negative for arthralgias.  Neurological:  Positive for headaches. Negative for dizziness.  Psychiatric/Behavioral:  Positive for dysphoric mood and sleep disturbance. The patient is nervous/anxious.     Patient Active  Problem List   Diagnosis Date Noted   Hyponatremia 09/21/2022   Iron deficiency anemia 09/12/2022   Chronic lung disease 09/12/2022   Protein-calorie malnutrition, moderate 06/14/2020   Pleural effusion on left 06/14/2020   COPD mixed type 06/14/2020   Aortic atherosclerosis 01/06/2020   Disorder of thyroid 08/04/2019   Frequent falls 07/24/2018   Tremor, unspecified 06/25/2018   Special screening for malignant neoplasms, colon    Tobacco use disorder 05/29/2016   Migraine without aura and without status migrainosus, not intractable 03/28/2015   GERD without esophagitis 03/28/2015   Osteoporosis 03/28/2015   Insomnia 03/28/2015   Moderately severe recurrent major depression 03/28/2015   Anxiety disorder 03/28/2015    Allergies  Allergen Reactions   Zoloft [Sertraline Hcl]     diaphoresis   Adhesive [Tape] Rash    Band-aids   Penicillins Rash    Past Surgical History:  Procedure Laterality Date   AUGMENTATION MAMMAPLASTY Bilateral 1976   BREAST ENHANCEMENT SURGERY  1976   CATARACT EXTRACTION  2002   COLONOSCOPY WITH PROPOFOL N/A 07/10/2017   Procedure: COLONOSCOPY WITH PROPOFOL;  Surgeon: Toney Reil, MD;  Location: [facility];  Service: Endoscopy;  Laterality: N/A;   POLYPECTOMY N/A 07/10/2017   Procedure: POLYPECTOMY;  Surgeon: Toney Reil, MD;  Location: [facility];  Service: Endoscopy;  Laterality: N/A;    Social History   Tobacco Use  Smoking status: Every Day    Packs/day: 0.50    Years: 30.00    Additional pack years: 0.00    Total pack years: 15.00    Types: Cigarettes   Smokeless tobacco: Never   Tobacco comments:    0.5PPD  10/25/2020  Vaping Use   Vaping Use: Never used  Substance Use Topics   Alcohol use: Not Currently   Drug use: No     Medication list has been reviewed and updated.  Current Meds  Medication Sig   albuterol (VENTOLIN HFA) 108 (90 Base) MCG/ACT inhaler Inhale 2 puffs into the lungs every 6  (six) hours as needed for wheezing or shortness of breath.   aspirin EC 81 MG tablet Take 81 mg by mouth daily.   atorvastatin (LIPITOR) 40 MG tablet Take 40 mg by mouth at bedtime.   Calcium-Magnesium-Vitamin D (CALCIUM 500 PO) Take 1 tablet by mouth daily.   ferrous sulfate 325 (65 FE) MG tablet Take 325 mg by mouth daily with breakfast.   furosemide (LASIX) 20 MG tablet Take 20 mg by mouth daily as needed for edema.   ipratropium-albuterol (DUONEB) 0.5-2.5 (3) MG/3ML SOLN Take 3 mLs by nebulization every 6 (six) hours as needed.   magnesium oxide (MAG-OX) 400 (240 Mg) MG tablet Take 400 mg by mouth daily.   mirtazapine (REMERON) 45 MG tablet Take 45 mg by mouth at bedtime.   Multiple Vitamin (MULTI-DAY PO) Take 1 tablet by mouth daily.   polyethylene glycol (MIRALAX / GLYCOLAX) 17 g packet Take 17 g by mouth daily as needed.   senna-docusate (SENOKOT-S) 8.6-50 MG tablet Take 1 tablet by mouth 2 (two) times daily.   SUMAtriptan (IMITREX) 100 MG tablet Take 1 tablet (100 mg total) by mouth as needed. May repeat in 2 hours if headache persists or recurs.   thiamine (VITAMIN B1) 100 MG tablet Take by mouth.   Tiotropium Bromide Monohydrate (SPIRIVA RESPIMAT) 1.25 MCG/ACT AERS Inhale 2 puffs into the lungs daily.   traZODone (DESYREL) 100 MG tablet Take 100 mg by mouth at bedtime.   Venlafaxine HCl 225 MG TB24 Take 1 tablet by mouth every morning.   zolpidem (AMBIEN) 10 MG tablet Take 10 mg by mouth at bedtime as needed.   [DISCONTINUED] nicotine polacrilex (NICORETTE) 2 MG gum Take 2 mg by mouth every 4 (four) hours while awake.       09/21/2022    3:46 PM 09/12/2022    2:03 PM 09/03/2022    2:23 PM 10/10/2021   10:00 AM  GAD 7 : Generalized Anxiety Score  Nervous, Anxious, on Edge Control/stop worrying Worry too much - different things Trouble relaxing Restless Easily annoyed or irritable 0 0 0 0  Afraid - awful might happen Total GAD  7 Score Anxiety Difficulty Extremely difficult Extremely difficult Extremely difficult Extremely difficult       09/21/2022    3:46 PM 09/12/2022    2:03 PM 09/03/2022    2:23 PM  Depression screen PHQ 2/9  Decreased Interest Down, Depressed, Hopeless PHQ - 2 Score Altered sleeping 0 0 0  Tired, decreased energy Change in appetite Feeling bad or failure about yourself  Trouble concentrating Moving slowly or fidgety/restless Suicidal thoughts 0 0 0  PHQ-9 Score Difficult doing work/chores Somewhat difficult Somewhat difficult Extremely dIfficult    BP Readings from Last 3 Encounters:  09/21/22 126/76  09/12/22 128/72  09/03/22 100/64    Physical Exam Constitutional:      General: She is not in acute distress. Cardiovascular:     Rate and Rhythm: Normal rate and regular rhythm.  Pulmonary:     Effort: Pulmonary effort is normal.     Breath sounds: Decreased air movement present. No wheezing or rhonchi.  Musculoskeletal:     Right lower leg: 2+ Pitting Edema present.     Left lower leg: 2+ Pitting Edema present.  Neurological:     Mental Status: She is alert.     Wt Readings from Last 3 Encounters:  09/21/22 92 lb (41.7 kg)  09/12/22 95 lb (43.1 kg)  10/10/21 87 lb 3.2 oz (39.6 kg)    BP 126/76   Pulse 90   Ht  (1.702 m)   Wt 92 lb (41.7 kg)   SpO2 94%   BMI 14.41 kg/m   Assessment and Plan:  Problem List Items Addressed This Visit       Respiratory   Chronic lung disease - Primary    With recent hospital stay for pleural effusion She continues to smoke and has poor appetite She has purchased a nebulizer - will started Duonebs q6 hr prn      Relevant Medications   ipratropium-albuterol (DUONEB) 0.5-2.5 (3) MG/3ML SOLN   Pleural effusion on left    Noted on recent hospital stay Rheumatology work up negative Cultures negative Pulmonary consult in one month Will  repeat CXR to rule out worsening effusion Oxygen sats acceptable Continue same dose of lasix      Relevant Orders   CBC with Differential/Platelet   DG Chest 2 View     Other   Hyponatremia    Stable around 132 during hospital stay. Will obtain BMP to document stability      Relevant Orders   Basic metabolic panel   Protein-calorie malnutrition, moderate    With dependent edema - slightly worse Encourage more time with legs elevated Continue Protein supplements and better diet Could consider compression stockings      Tobacco use disorder (Chronic)    She continues to smoke about 1/2 ppd and is not motivated to quit       No follow-ups on file.   Partially dictated using Dragon software, any errors are not intentional.  Reubin Milan, MD [facility] Primary Care and Sports Medicine [place], [place]

## 2022-09-21 NOTE — Assessment & Plan Note (Signed)
She continues to smoke about 1/2 ppd and is not motivated to quit

## 2022-09-21 NOTE — Assessment & Plan Note (Signed)
Stable around 132 during hospital stay. Will obtain BMP to document stability

## 2022-09-21 NOTE — Assessment & Plan Note (Signed)
With dependent edema - slightly worse Encourage more time with legs elevated Continue Protein supplements and better diet Could consider compression stockings

## 2022-09-21 NOTE — Assessment & Plan Note (Signed)
Noted on recent hospital stay Rheumatology work up negative Cultures negative Pulmonary consult in one month Will repeat CXR to rule out worsening effusion Oxygen sats acceptable Continue same dose of lasix

## 2022-09-21 NOTE — Assessment & Plan Note (Signed)
With recent hospital stay for pleural effusion She continues to smoke and has poor appetite She has purchased a nebulizer - will started Duonebs q6 hr prn

## 2022-10-04 ENCOUNTER — Other Ambulatory Visit: Payer: Self-pay | Admitting: Internal Medicine

## 2022-10-04 ENCOUNTER — Telehealth: Payer: Self-pay | Admitting: Internal Medicine

## 2022-10-04 NOTE — Telephone Encounter (Signed)
Pt states that she was seen in office 09/21/22 advised that a nebulizer would [facility] to  [name], [place] - [address] Phone: [phone]  Fax: [phone]    Pt has checked with the pharmacy. Medication has not been received.  Please advise with the pt. CB- [phone]

## 2022-10-04 NOTE — Telephone Encounter (Signed)
Please review.  KP

## 2022-10-04 NOTE — Telephone Encounter (Signed)
Requested medication (s) are due for refill today: yes  Requested medication (s) are on the active medication list: yes  Last refill:  09/03/22  Future visit scheduled: yes  Notes to clinic:  Unable to refill per protocol, last refill by another provider.  Historical provider, routing for approval.     Requested Prescriptions  Pending Prescriptions Disp Refills   furosemide (LASIX) 20 MG tablet 30 tablet     Sig: Take 1 tablet (20 mg total) by mouth daily as needed for edema.     Cardiovascular:  Diuretics - Loop Failed - 10/04/2022  1:42 PM      Failed - Na in normal range and within 180 days    Sodium  Date Value Ref Range Status  09/21/2022 129 (L) 135 - 145 mmol/L Final  09/03/2022 132 (L) 134 - 144 mmol/L Final         Failed - Cl in normal range and within 180 days    Chloride  Date Value Ref Range Status  09/21/2022 87 (L) 98 - 111 mmol/L Final         Failed - Mg Level in normal range and within 180 days    No results found for: "MG"       Passed - K in normal range and within 180 days    Potassium  Date Value Ref Range Status  09/21/2022 3.7 3.5 - 5.1 mmol/L Final         Passed - Ca in normal range and within 180 days    Calcium  Date Value Ref Range Status  09/21/2022 9.3 8.9 - 10.3 mg/dL Final         Passed - Cr in normal range and within 180 days    Creatinine, Ser  Date Value Ref Range Status  09/21/2022 0.55 0.44 - 1.00 mg/dL Final         Passed - Last BP in normal range    BP Readings from Last 1 Encounters:  09/21/22 126/76         Passed - Valid encounter within last 6 months    Recent Outpatient Visits           1 week ago Chronic lung disease   [place] [facility] Medicine at [facility], Nyoka Cowden, MD   3 weeks ago Migraine without aura and without status migrainosus, not intractable   [place] [facility] Care & Sports Medicine at [facility], Nyoka Cowden, MD   1 month ago Hyponatremia   [facility] Primary Care & Sports Medicine at [facility], Oswaldo Conroy, PA-C   11 months ago Migraine without aura and without status migrainosus, not intractable   [place] Primary Care & Sports Medicine at [facility], Nyoka Cowden, MD   1 year ago Protein-calorie malnutrition, moderate [facility])   [place] Primary Care & Sports Medicine at [facility], Nyoka Cowden, MD       Future Appointments             In 1 week Judithann Graves, Nyoka Cowden, MD [facility] Primary Care & Sports Medicine at [facility]

## 2022-10-04 NOTE — Telephone Encounter (Signed)
Medication Refill - Medication: furosemide (LASIX) 20 MG tablet [[id number]] Historical Provider. Pt states that she saw Dr. Judithann Graves one week ago. Denied appt.  Has the patient contacted their pharmacy? Yes.   (Agent: If no, request that the patient contact the pharmacy for the refill. If patient does not wish to contact the pharmacy document the reason why and proceed with request.) (Agent: If yes, when and what did the pharmacy advise?)  Preferred Pharmacy (with phone number or street name):  [name], [place] - [address] Phone: [phone]  Fax: [phone]     Has the patient been seen for an appointment in the last year OR does the patient have an upcoming appointment? Yes.    Agent: Please be advised that RX refills may take up to 3 business days. We ask that you follow-up with your pharmacy.

## 2022-10-05 ENCOUNTER — Other Ambulatory Visit: Payer: Self-pay

## 2022-10-05 MED ORDER — FUROSEMIDE 20 MG PO TABS: 20.0000 mg | ORAL_TABLET | Freq: Every day | ORAL | 2 refills | Status: DC | PRN

## 2022-10-05 NOTE — Telephone Encounter (Signed)
Patient confused. Did not know nebulizer solution was called in. [facility] [place]. Patient informed.  - Chanice Brenton

## 2022-10-08 DIAGNOSIS — F332 Major depressive disorder, recurrent severe without psychotic features: Secondary | ICD-10-CM | POA: Diagnosis not present

## 2022-10-15 DIAGNOSIS — Z96641 Presence of right artificial hip joint: Secondary | ICD-10-CM | POA: Diagnosis not present

## 2022-10-15 DIAGNOSIS — S72041D Displaced fracture of base of neck of right femur, subsequent encounter for closed fracture with routine healing: Secondary | ICD-10-CM | POA: Diagnosis not present

## 2022-10-15 DIAGNOSIS — F17210 Nicotine dependence, cigarettes, uncomplicated: Secondary | ICD-10-CM | POA: Diagnosis not present

## 2022-10-15 DIAGNOSIS — W1839XD Other fall on same level, subsequent encounter: Secondary | ICD-10-CM | POA: Diagnosis not present

## 2022-10-17 ENCOUNTER — Encounter: Payer: Federal, State, Local not specified - PPO | Admitting: Internal Medicine

## 2022-10-17 DIAGNOSIS — R0609 Other forms of dyspnea: Secondary | ICD-10-CM | POA: Diagnosis not present

## 2022-10-17 DIAGNOSIS — R0602 Shortness of breath: Secondary | ICD-10-CM | POA: Diagnosis not present

## 2022-10-19 ENCOUNTER — Telehealth: Payer: Self-pay | Admitting: Internal Medicine

## 2022-10-19 ENCOUNTER — Encounter: Payer: Self-pay | Admitting: Internal Medicine

## 2022-10-19 NOTE — Telephone Encounter (Signed)
Patient informed. Wants to pick up letter in office. Will pick up Monday.

## 2022-10-19 NOTE — Telephone Encounter (Signed)
Copied from CRM [id number]. Topic: General - Other >> Oct 19, 2022  8:44 AM Geoffry Paradise G wrote: Reason for CRM: pt will be having a dental Procedure thursday Oct 25, 2022 and will need an all clear letter per the dentist.  Can you please provide that letter PLease call pt and advise

## 2022-10-25 ENCOUNTER — Encounter: Payer: Self-pay | Admitting: Internal Medicine

## 2022-10-30 ENCOUNTER — Telehealth: Payer: Self-pay | Admitting: Internal Medicine
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
## 2022-10-30 NOTE — Telephone Encounter (Signed)
Done - verbal given

## 2022-10-31 ENCOUNTER — Telehealth: Payer: Self-pay | Admitting: Internal Medicine

## 2022-10-31 ENCOUNTER — Other Ambulatory Visit: Payer: Self-pay | Admitting: Internal Medicine

## 2022-10-31 DIAGNOSIS — J984 Other disorders of lung: Secondary | ICD-10-CM

## 2022-10-31 MED ORDER — IPRATROPIUM-ALBUTEROL 0.5-2.5 (3) MG/3ML IN SOLN
3.0000 mL | Freq: Four times a day (QID) | RESPIRATORY_TRACT | 0 refills | Status: DC | PRN
Start: 2022-10-31 — End: 2022-11-01

## 2022-10-31 NOTE — Telephone Encounter (Signed)
Copied from CRM [id number]. Topic: General - Other >> Oct 31, 2022  3:48 PM Carrielelia G wrote: ipratropium-albuterol (DUONEB) 0.5-2.5 (3) MG/3ML SOLN [[id number]]  Pt spoke with pharmacy and stated that the medication is coded wrong  [facility] [address] [name], [place] - [address] [place] [address] Phone: [phone] Fax: [phone] Hours: Not open 24 hours

## 2022-10-31 NOTE — Telephone Encounter (Signed)
Medication Refill - Medication:  ipratropium-albuterol (DUONEB) 0.5-2.5 (3) MG/3ML SOLN  Has the patient contacted their pharmacy? Yes.   Pharmacy stated they never got the order  Preferred Pharmacy (with phone number or street name): [name], [place] - [address] Phone: [phone]  Fax: [phone]   Has the patient been seen for an appointment in the last year OR does the patient have an upcoming appointment? No.  Agent: Please be advised that RX refills may take up to 3 business days. We ask that you follow-up with your pharmacy.

## 2022-10-31 NOTE — Telephone Encounter (Signed)
Requested Prescriptions  Pending Prescriptions Disp Refills   ipratropium-albuterol (DUONEB) 0.5-2.5 (3) MG/3ML SOLN 360 mL 0    Sig: Take 3 mLs by nebulization every 6 (six) hours as needed.     Pulmonology:  Combination Products - albuterol / ipratropium Passed - 10/31/2022  3:59 PM      Passed - Last BP in normal range    BP Readings from Last 1 Encounters:  09/21/22 126/76         Passed - Last Heart Rate in normal range    Pulse Readings from Last 1 Encounters:  09/21/22 90         Passed - Valid encounter within last 12 months    Recent Outpatient Visits           1 month ago Chronic lung disease   [place] [facility] Medicine at [facility], Nyoka Cowden, MD   1 month ago Migraine without aura and without status migrainosus, not intractable   [place] Primary Care & Sports Medicine at [facility], Nyoka Cowden, MD   1 month ago Hyponatremia   [facility] Primary Care & Sports Medicine at [facility], Oswaldo Conroy, PA-C   1 year ago Migraine without aura and without status migrainosus, not intractable   [place] Primary Care & Sports Medicine at [facility], Nyoka Cowden, MD   1 year ago Protein-calorie malnutrition, moderate [facility])   [facility] Care & Sports Medicine at [facility], Nyoka Cowden, MD

## 2022-10-31 NOTE — Telephone Encounter (Signed)
A user error has taken place: encounter opened in error, closed for administrative reasons.

## 2022-11-01 ENCOUNTER — Other Ambulatory Visit: Payer: Self-pay

## 2022-11-01 DIAGNOSIS — J449 Chronic obstructive pulmonary disease, unspecified: Secondary | ICD-10-CM

## 2022-11-01 MED ORDER — IPRATROPIUM-ALBUTEROL 0.5-2.5 (3) MG/3ML IN SOLN
3.0000 mL | Freq: Four times a day (QID) | RESPIRATORY_TRACT | 0 refills | Status: DC | PRN
Start: 2022-11-01 — End: 2023-05-07

## 2022-11-01 NOTE — Telephone Encounter (Signed)
Resent medication with different dx code.  - Carolyn Dunlap

## 2022-11-01 NOTE — Telephone Encounter (Signed)
Requested medications are due for refill today.  See note  Requested medications are on the active medications list.  yes  Last refill. 10/31/2022 - ordered , not filled  Future visit scheduled.   no  Notes to clinic.  Pharmacy comment: Script Clarification:PART B DOES NOT TAKE J98.4 FOR COVERAGE AND PART D IS A PRIOR AUTH.    Requested Prescriptions  Pending Prescriptions Disp Refills   ipratropium-albuterol (DUONEB) 0.5-2.5 (3) MG/3ML SOLN [Pharmacy Med Name: IPRAT-ALBUT 0.5-3(2.5) MG/3 ML] 360 mL 0    Sig: INHALE 3 ML BY NEBULIZER EVERY 6 HOURS AS NEEDED     Pulmonology:  Combination Products - albuterol / ipratropium Passed - 10/31/2022  5:10 PM      Passed - Last BP in normal range    BP Readings from Last 1 Encounters:  09/21/22 126/76         Passed - Last Heart Rate in normal range    Pulse Readings from Last 1 Encounters:  09/21/22 90         Passed - Valid encounter within last 12 months    Recent Outpatient Visits           1 month ago Chronic lung disease   [place] [facility] Care & Sports Medicine at [facility], Nyoka Cowden, MD   1 month ago Migraine without aura and without status migrainosus, not intractable   [place] Primary Care & Sports Medicine at [facility], Nyoka Cowden, MD   1 month ago Hyponatremia   [facility] Primary Care & Sports Medicine at [facility], Oswaldo Conroy, PA-C   1 year ago Migraine without aura and without status migrainosus, not intractable    Primary Care & Sports Medicine at [facility], Nyoka Cowden, MD   1 year ago Protein-calorie malnutrition, moderate [facility])   [facility] Health Primary Care & Sports Medicine at [facility], Nyoka Cowden, MD

## 2022-11-06 DIAGNOSIS — F5104 Psychophysiologic insomnia: Secondary | ICD-10-CM | POA: Diagnosis not present

## 2022-11-06 DIAGNOSIS — F5082 Avoidant/restrictive food intake disorder: Secondary | ICD-10-CM | POA: Diagnosis not present

## 2022-11-07 DIAGNOSIS — R634 Abnormal weight loss: Secondary | ICD-10-CM | POA: Diagnosis not present

## 2022-11-07 DIAGNOSIS — F17200 Nicotine dependence, unspecified, uncomplicated: Secondary | ICD-10-CM | POA: Diagnosis not present

## 2022-11-07 DIAGNOSIS — R0609 Other forms of dyspnea: Secondary | ICD-10-CM | POA: Diagnosis not present

## 2022-11-07 DIAGNOSIS — J90 Pleural effusion, not elsewhere classified: Secondary | ICD-10-CM | POA: Diagnosis not present

## 2022-11-07 DIAGNOSIS — J479 Bronchiectasis, uncomplicated: Secondary | ICD-10-CM | POA: Diagnosis not present

## 2022-11-07 DIAGNOSIS — J8410 Pulmonary fibrosis, unspecified: Secondary | ICD-10-CM | POA: Diagnosis not present

## 2022-11-07 DIAGNOSIS — R531 Weakness: Secondary | ICD-10-CM | POA: Diagnosis not present

## 2022-11-13 DIAGNOSIS — F331 Major depressive disorder, recurrent, moderate: Secondary | ICD-10-CM | POA: Diagnosis not present

## 2022-11-13 DIAGNOSIS — I6521 Occlusion and stenosis of right carotid artery: Secondary | ICD-10-CM | POA: Insufficient documentation

## 2022-11-13 DIAGNOSIS — R630 Anorexia: Secondary | ICD-10-CM | POA: Diagnosis not present

## 2022-11-13 DIAGNOSIS — F17200 Nicotine dependence, unspecified, uncomplicated: Secondary | ICD-10-CM | POA: Diagnosis not present

## 2022-11-13 DIAGNOSIS — Z8673 Personal history of transient ischemic attack (TIA), and cerebral infarction without residual deficits: Secondary | ICD-10-CM | POA: Diagnosis not present

## 2022-11-13 DIAGNOSIS — F1090 Alcohol use, unspecified, uncomplicated: Secondary | ICD-10-CM | POA: Diagnosis not present

## 2022-11-13 DIAGNOSIS — E46 Unspecified protein-calorie malnutrition: Secondary | ICD-10-CM | POA: Diagnosis not present

## 2022-11-13 DIAGNOSIS — M810 Age-related osteoporosis without current pathological fracture: Secondary | ICD-10-CM | POA: Diagnosis not present

## 2022-11-20 DIAGNOSIS — Z8673 Personal history of transient ischemic attack (TIA), and cerebral infarction without residual deficits: Secondary | ICD-10-CM | POA: Diagnosis not present

## 2022-11-20 DIAGNOSIS — I6521 Occlusion and stenosis of right carotid artery: Secondary | ICD-10-CM | POA: Diagnosis not present

## 2022-11-20 DIAGNOSIS — E440 Moderate protein-calorie malnutrition: Secondary | ICD-10-CM | POA: Diagnosis not present

## 2022-11-20 DIAGNOSIS — G43009 Migraine without aura, not intractable, without status migrainosus: Secondary | ICD-10-CM | POA: Diagnosis not present

## 2022-11-20 DIAGNOSIS — F17200 Nicotine dependence, unspecified, uncomplicated: Secondary | ICD-10-CM | POA: Diagnosis not present

## 2022-11-20 DIAGNOSIS — E785 Hyperlipidemia, unspecified: Secondary | ICD-10-CM | POA: Diagnosis not present

## 2022-11-21 ENCOUNTER — Other Ambulatory Visit (INDEPENDENT_AMBULATORY_CARE_PROVIDER_SITE_OTHER): Payer: Medicare Other | Admitting: Internal Medicine

## 2022-11-21 DIAGNOSIS — I951 Orthostatic hypotension: Secondary | ICD-10-CM

## 2022-11-21 DIAGNOSIS — K5900 Constipation, unspecified: Secondary | ICD-10-CM

## 2022-11-21 DIAGNOSIS — F332 Major depressive disorder, recurrent severe without psychotic features: Secondary | ICD-10-CM

## 2022-11-21 DIAGNOSIS — F1020 Alcohol dependence, uncomplicated: Secondary | ICD-10-CM

## 2022-11-21 DIAGNOSIS — R630 Anorexia: Secondary | ICD-10-CM

## 2022-11-21 DIAGNOSIS — G43009 Migraine without aura, not intractable, without status migrainosus: Secondary | ICD-10-CM

## 2022-11-21 DIAGNOSIS — F17210 Nicotine dependence, cigarettes, uncomplicated: Secondary | ICD-10-CM

## 2022-11-21 DIAGNOSIS — R531 Weakness: Secondary | ICD-10-CM

## 2022-11-21 DIAGNOSIS — J984 Other disorders of lung: Secondary | ICD-10-CM

## 2022-11-21 DIAGNOSIS — M810 Age-related osteoporosis without current pathological fracture: Secondary | ICD-10-CM

## 2022-11-21 DIAGNOSIS — Z7901 Long term (current) use of anticoagulants: Secondary | ICD-10-CM

## 2022-11-21 DIAGNOSIS — Z96641 Presence of right artificial hip joint: Secondary | ICD-10-CM

## 2022-11-21 DIAGNOSIS — F17200 Nicotine dependence, unspecified, uncomplicated: Secondary | ICD-10-CM

## 2022-11-21 DIAGNOSIS — R0609 Other forms of dyspnea: Secondary | ICD-10-CM

## 2022-11-21 DIAGNOSIS — R3915 Urgency of urination: Secondary | ICD-10-CM

## 2022-11-21 DIAGNOSIS — R296 Repeated falls: Secondary | ICD-10-CM

## 2022-11-21 DIAGNOSIS — I6521 Occlusion and stenosis of right carotid artery: Secondary | ICD-10-CM

## 2022-11-21 DIAGNOSIS — R627 Adult failure to thrive: Secondary | ICD-10-CM

## 2022-11-21 DIAGNOSIS — E43 Unspecified severe protein-calorie malnutrition: Secondary | ICD-10-CM

## 2022-11-21 DIAGNOSIS — I69354 Hemiplegia and hemiparesis following cerebral infarction affecting left non-dominant side: Secondary | ICD-10-CM

## 2022-11-21 DIAGNOSIS — J90 Pleural effusion, not elsewhere classified: Secondary | ICD-10-CM

## 2022-11-21 DIAGNOSIS — J8410 Pulmonary fibrosis, unspecified: Secondary | ICD-10-CM

## 2022-11-21 NOTE — Progress Notes (Signed)
Received home health orders orders from [facility]. Start of care 09/01/22.   Certification and orders from 10/31/22 through 12/29/22 are reviewed, signed and faxed back to home health company.  Need of intermittent skilled services at home: home bound  The home health care plan has been established by me and will be reviewed and updated as needed to maximize patient recovery.  I certify that all home health services have been and will be furnished to the patient while under my care.  Face-to-face encounter in which the need for home health services was established: 09/12/22  Patient is receiving home health services for the following diagnoses: Problem List Items Addressed This Visit     Urinary urgency   Unspecified severe protein-calorie malnutrition (HCC)   Tobacco use disorder (Chronic)   Right internal carotid occlusion   Pulmonary fibrosis (HCC)   Presence of right artificial hip joint   Postmenopausal osteoporosis   Orthostatic hypotension   Nicotine dependence, cigarettes, uncomplicated   Moderately severe recurrent major depression (HCC) (Chronic)   Migraine without aura and without status migrainosus, not intractable (Chronic)   Major depressive disorder, recurrent severe without psychotic features (HCC)   Loss of appetite   Long term (current) use of anticoagulants   Hemiparesis affecting left side as late effect of cerebrovascular accident (HCC) - Primary   Generalized weakness   Frequent falls   Failure to thrive in adult   Dyspnea on exertion   Constipation, unspecified   Chronic pleural effusion   Chronic lung disease   Alcohol dependence, uncomplicated (HCC)     Bari Edward, MD

## 2022-11-22 ENCOUNTER — Telehealth: Payer: Self-pay | Admitting: Internal Medicine

## 2022-11-22 NOTE — Telephone Encounter (Signed)
Pt is calling in because [facility] Pt says pharmacy said she would need a new prescription. atorvastatin (LIPITOR) 40 MG tablet [[id number]]

## 2022-11-23 ENCOUNTER — Encounter: Payer: Self-pay | Admitting: Internal Medicine

## 2022-11-23 NOTE — Telephone Encounter (Signed)
Called patient, she said she will call to make appointment later.

## 2022-11-23 NOTE — Telephone Encounter (Signed)
Please advise, not RX by you

## 2022-12-02 DIAGNOSIS — J929 Pleural plaque without asbestos: Secondary | ICD-10-CM | POA: Diagnosis not present

## 2022-12-02 DIAGNOSIS — J8410 Pulmonary fibrosis, unspecified: Secondary | ICD-10-CM | POA: Diagnosis not present

## 2022-12-02 DIAGNOSIS — J9811 Atelectasis: Secondary | ICD-10-CM | POA: Diagnosis not present

## 2022-12-02 DIAGNOSIS — R0609 Other forms of dyspnea: Secondary | ICD-10-CM | POA: Diagnosis not present

## 2022-12-02 DIAGNOSIS — J90 Pleural effusion, not elsewhere classified: Secondary | ICD-10-CM | POA: Diagnosis not present

## 2022-12-02 DIAGNOSIS — J984 Other disorders of lung: Secondary | ICD-10-CM | POA: Diagnosis not present

## 2022-12-02 DIAGNOSIS — J479 Bronchiectasis, uncomplicated: Secondary | ICD-10-CM | POA: Diagnosis not present

## 2022-12-02 DIAGNOSIS — R590 Localized enlarged lymph nodes: Secondary | ICD-10-CM | POA: Diagnosis not present

## 2022-12-02 DIAGNOSIS — R918 Other nonspecific abnormal finding of lung field: Secondary | ICD-10-CM | POA: Diagnosis not present

## 2022-12-02 DIAGNOSIS — J811 Chronic pulmonary edema: Secondary | ICD-10-CM | POA: Diagnosis not present

## 2022-12-03 DIAGNOSIS — J811 Chronic pulmonary edema: Secondary | ICD-10-CM | POA: Diagnosis not present

## 2022-12-03 DIAGNOSIS — J90 Pleural effusion, not elsewhere classified: Secondary | ICD-10-CM | POA: Diagnosis not present

## 2022-12-03 DIAGNOSIS — Z7982 Long term (current) use of aspirin: Secondary | ICD-10-CM | POA: Diagnosis not present

## 2022-12-03 DIAGNOSIS — J9811 Atelectasis: Secondary | ICD-10-CM | POA: Diagnosis not present

## 2022-12-03 DIAGNOSIS — J984 Other disorders of lung: Secondary | ICD-10-CM | POA: Diagnosis not present

## 2023-01-04 DIAGNOSIS — F3289 Other specified depressive episodes: Secondary | ICD-10-CM | POA: Diagnosis not present

## 2023-01-04 DIAGNOSIS — M810 Age-related osteoporosis without current pathological fracture: Secondary | ICD-10-CM | POA: Diagnosis not present

## 2023-01-04 DIAGNOSIS — F17200 Nicotine dependence, unspecified, uncomplicated: Secondary | ICD-10-CM | POA: Diagnosis not present

## 2023-01-04 DIAGNOSIS — G43009 Migraine without aura, not intractable, without status migrainosus: Secondary | ICD-10-CM | POA: Diagnosis not present

## 2023-01-04 DIAGNOSIS — E46 Unspecified protein-calorie malnutrition: Secondary | ICD-10-CM | POA: Diagnosis not present

## 2023-01-04 DIAGNOSIS — J8410 Pulmonary fibrosis, unspecified: Secondary | ICD-10-CM | POA: Diagnosis not present

## 2023-01-04 DIAGNOSIS — Z8673 Personal history of transient ischemic attack (TIA), and cerebral infarction without residual deficits: Secondary | ICD-10-CM | POA: Diagnosis not present

## 2023-01-12 ENCOUNTER — Other Ambulatory Visit: Payer: Self-pay | Admitting: Internal Medicine

## 2023-02-12 DIAGNOSIS — M810 Age-related osteoporosis without current pathological fracture: Secondary | ICD-10-CM | POA: Diagnosis not present

## 2023-03-19 DIAGNOSIS — F17210 Nicotine dependence, cigarettes, uncomplicated: Secondary | ICD-10-CM | POA: Diagnosis not present

## 2023-03-19 DIAGNOSIS — E785 Hyperlipidemia, unspecified: Secondary | ICD-10-CM | POA: Diagnosis not present

## 2023-03-19 DIAGNOSIS — Z7982 Long term (current) use of aspirin: Secondary | ICD-10-CM | POA: Diagnosis not present

## 2023-03-19 DIAGNOSIS — G43009 Migraine without aura, not intractable, without status migrainosus: Secondary | ICD-10-CM | POA: Diagnosis not present

## 2023-03-19 DIAGNOSIS — I6521 Occlusion and stenosis of right carotid artery: Secondary | ICD-10-CM | POA: Diagnosis not present

## 2023-03-19 DIAGNOSIS — Z8673 Personal history of transient ischemic attack (TIA), and cerebral infarction without residual deficits: Secondary | ICD-10-CM | POA: Diagnosis not present

## 2023-03-19 DIAGNOSIS — F17200 Nicotine dependence, unspecified, uncomplicated: Secondary | ICD-10-CM | POA: Diagnosis not present

## 2023-03-19 DIAGNOSIS — Z79899 Other long term (current) drug therapy: Secondary | ICD-10-CM | POA: Diagnosis not present

## 2023-03-19 DIAGNOSIS — R627 Adult failure to thrive: Secondary | ICD-10-CM | POA: Diagnosis not present

## 2023-03-19 DIAGNOSIS — J449 Chronic obstructive pulmonary disease, unspecified: Secondary | ICD-10-CM | POA: Diagnosis not present

## 2023-03-22 DIAGNOSIS — Z23 Encounter for immunization: Secondary | ICD-10-CM | POA: Diagnosis not present

## 2023-04-01 DIAGNOSIS — M810 Age-related osteoporosis without current pathological fracture: Secondary | ICD-10-CM | POA: Diagnosis not present

## 2023-05-06 ENCOUNTER — Other Ambulatory Visit: Payer: Self-pay | Admitting: Internal Medicine

## 2023-05-06 DIAGNOSIS — Z114 Encounter for screening for human immunodeficiency virus [HIV]: Secondary | ICD-10-CM | POA: Diagnosis not present

## 2023-05-06 DIAGNOSIS — J849 Interstitial pulmonary disease, unspecified: Secondary | ICD-10-CM | POA: Diagnosis not present

## 2023-05-06 DIAGNOSIS — J449 Chronic obstructive pulmonary disease, unspecified: Secondary | ICD-10-CM | POA: Diagnosis not present

## 2023-05-06 DIAGNOSIS — J479 Bronchiectasis, uncomplicated: Secondary | ICD-10-CM | POA: Diagnosis not present

## 2023-05-06 DIAGNOSIS — J8410 Pulmonary fibrosis, unspecified: Secondary | ICD-10-CM | POA: Diagnosis not present

## 2023-05-06 DIAGNOSIS — R0609 Other forms of dyspnea: Secondary | ICD-10-CM | POA: Diagnosis not present

## 2023-05-06 DIAGNOSIS — F17200 Nicotine dependence, unspecified, uncomplicated: Secondary | ICD-10-CM | POA: Diagnosis not present

## 2023-05-06 DIAGNOSIS — G4733 Obstructive sleep apnea (adult) (pediatric): Secondary | ICD-10-CM | POA: Diagnosis not present

## 2023-05-06 DIAGNOSIS — Z113 Encounter for screening for infections with a predominantly sexual mode of transmission: Secondary | ICD-10-CM | POA: Diagnosis not present

## 2023-05-06 DIAGNOSIS — J90 Pleural effusion, not elsewhere classified: Secondary | ICD-10-CM | POA: Diagnosis not present

## 2023-05-07 DIAGNOSIS — F331 Major depressive disorder, recurrent, moderate: Secondary | ICD-10-CM | POA: Diagnosis not present

## 2023-05-07 DIAGNOSIS — G43009 Migraine without aura, not intractable, without status migrainosus: Secondary | ICD-10-CM | POA: Diagnosis not present

## 2023-05-07 DIAGNOSIS — Z1231 Encounter for screening mammogram for malignant neoplasm of breast: Secondary | ICD-10-CM | POA: Diagnosis not present

## 2023-05-07 DIAGNOSIS — E871 Hypo-osmolality and hyponatremia: Secondary | ICD-10-CM | POA: Diagnosis not present

## 2023-05-07 NOTE — Telephone Encounter (Signed)
Requested Prescriptions  Pending Prescriptions Disp Refills   ipratropium-albuterol (DUONEB) 0.5-2.5 (3) MG/3ML SOLN [Pharmacy Med Name: IPRAT-ALBUT 0.5-3(2.5) MG/3 ML] 360 mL 0    Sig: INHALE 3 ML BY NEBULIZER EVERY 6 HOURS AS NEEDED     Pulmonology:  Combination Products - albuterol / ipratropium Passed - 05/06/2023  1:20 AM      Passed - Last BP in normal range    BP Readings from Last 1 Encounters:  09/21/22 126/76         Passed - Last Heart Rate in normal range    Pulse Readings from Last 1 Encounters:  09/21/22 90         Passed - Valid encounter within last 12 months    Recent Outpatient Visits           7 months ago Chronic lung disease   [place] [facility] Care & Sports Medicine at [facility], Nyoka Cowden, MD   7 months ago Migraine without aura and without status migrainosus, not intractable   [place] Primary Care & Sports Medicine at [facility], Nyoka Cowden, MD   8 months ago Hyponatremia   [facility] Primary Care & Sports Medicine at [facility], Oswaldo Conroy, PA-C   1 year ago Migraine without aura and without status migrainosus, not intractable   [place] Primary Care & Sports Medicine at [facility], Nyoka Cowden, MD   2 years ago Protein-calorie malnutrition, moderate [facility])   [facility] Health Primary Care & Sports Medicine at [facility], Nyoka Cowden, MD

## 2023-05-13 DIAGNOSIS — J8410 Pulmonary fibrosis, unspecified: Secondary | ICD-10-CM | POA: Diagnosis not present

## 2023-05-15 DIAGNOSIS — M810 Age-related osteoporosis without current pathological fracture: Secondary | ICD-10-CM | POA: Diagnosis not present

## 2023-05-21 DIAGNOSIS — I6521 Occlusion and stenosis of right carotid artery: Secondary | ICD-10-CM | POA: Diagnosis not present

## 2023-05-21 DIAGNOSIS — G43009 Migraine without aura, not intractable, without status migrainosus: Secondary | ICD-10-CM | POA: Diagnosis not present

## 2023-05-21 DIAGNOSIS — E785 Hyperlipidemia, unspecified: Secondary | ICD-10-CM | POA: Diagnosis not present

## 2023-05-21 DIAGNOSIS — F17200 Nicotine dependence, unspecified, uncomplicated: Secondary | ICD-10-CM | POA: Diagnosis not present

## 2023-05-21 DIAGNOSIS — E440 Moderate protein-calorie malnutrition: Secondary | ICD-10-CM | POA: Diagnosis not present

## 2023-05-21 DIAGNOSIS — Z8673 Personal history of transient ischemic attack (TIA), and cerebral infarction without residual deficits: Secondary | ICD-10-CM | POA: Diagnosis not present

## 2023-05-21 DIAGNOSIS — G4700 Insomnia, unspecified: Secondary | ICD-10-CM | POA: Diagnosis not present

## 2023-05-28 DIAGNOSIS — E871 Hypo-osmolality and hyponatremia: Secondary | ICD-10-CM | POA: Diagnosis not present

## 2023-06-06 DIAGNOSIS — Z1231 Encounter for screening mammogram for malignant neoplasm of breast: Secondary | ICD-10-CM | POA: Diagnosis not present

## 2023-06-20 DIAGNOSIS — E871 Hypo-osmolality and hyponatremia: Secondary | ICD-10-CM | POA: Diagnosis not present

## 2023-06-20 DIAGNOSIS — G43711 Chronic migraine without aura, intractable, with status migrainosus: Secondary | ICD-10-CM | POA: Diagnosis not present

## 2023-06-20 DIAGNOSIS — R251 Tremor, unspecified: Secondary | ICD-10-CM | POA: Diagnosis not present

## 2023-06-20 DIAGNOSIS — G43009 Migraine without aura, not intractable, without status migrainosus: Secondary | ICD-10-CM | POA: Diagnosis not present

## 2023-08-06 ENCOUNTER — Other Ambulatory Visit: Payer: Self-pay | Admitting: Internal Medicine

## 2023-08-06 DIAGNOSIS — J449 Chronic obstructive pulmonary disease, unspecified: Secondary | ICD-10-CM

## 2023-08-06 DIAGNOSIS — J8410 Pulmonary fibrosis, unspecified: Secondary | ICD-10-CM | POA: Diagnosis not present

## 2023-08-06 DIAGNOSIS — J929 Pleural plaque without asbestos: Secondary | ICD-10-CM | POA: Diagnosis not present

## 2023-08-06 DIAGNOSIS — F17200 Nicotine dependence, unspecified, uncomplicated: Secondary | ICD-10-CM | POA: Diagnosis not present

## 2023-08-06 DIAGNOSIS — R918 Other nonspecific abnormal finding of lung field: Secondary | ICD-10-CM | POA: Diagnosis not present

## 2023-08-06 DIAGNOSIS — J90 Pleural effusion, not elsewhere classified: Secondary | ICD-10-CM | POA: Diagnosis not present

## 2023-08-06 DIAGNOSIS — R0609 Other forms of dyspnea: Secondary | ICD-10-CM | POA: Diagnosis not present

## 2023-08-06 DIAGNOSIS — R590 Localized enlarged lymph nodes: Secondary | ICD-10-CM | POA: Diagnosis not present

## 2023-08-07 NOTE — Telephone Encounter (Signed)
 Requested Prescriptions  Pending Prescriptions Disp Refills   ipratropium-albuterol (DUONEB) 0.5-2.5 (3) MG/3ML SOLN [Pharmacy Med Name: IPRAT-ALBUT 0.5-3(2.5) MG/3 ML] 360 mL 0    Sig: INHALE 3 ML BY NEBULIZER EVERY 6 HOURS AS NEEDED     Pulmonology:  Combination Products - albuterol / ipratropium Passed - 08/07/2023  8:44 AM      Passed - Last BP in normal range    BP Readings from Last 1 Encounters:  09/21/22 126/76         Passed - Last Heart Rate in normal range    Pulse Readings from Last 1 Encounters:  09/21/22 90         Passed - Valid encounter within last 12 months    Recent Outpatient Visits           10 months ago Chronic lung disease   [place] [facility] at [facility], Nyoka Cowden, MD   10 months ago Migraine without aura and without status migrainosus, not intractable   [place] [facility] at [facility], Nyoka Cowden, MD   11 months ago Hyponatremia   [facility] at [facility], Oswaldo Conroy, PA-C   1 year ago Migraine without aura and without status migrainosus, not intractable   [place] [facility] at [facility], Nyoka Cowden, MD   2 years ago Protein-calorie malnutrition, moderate [facility])   [facility] at [facility], Nyoka Cowden, MD

## 2023-08-20 DIAGNOSIS — G43711 Chronic migraine without aura, intractable, with status migrainosus: Secondary | ICD-10-CM | POA: Diagnosis not present

## 2023-08-20 DIAGNOSIS — E785 Hyperlipidemia, unspecified: Secondary | ICD-10-CM | POA: Diagnosis not present

## 2023-08-20 DIAGNOSIS — F17200 Nicotine dependence, unspecified, uncomplicated: Secondary | ICD-10-CM | POA: Diagnosis not present

## 2023-08-20 DIAGNOSIS — Z8673 Personal history of transient ischemic attack (TIA), and cerebral infarction without residual deficits: Secondary | ICD-10-CM | POA: Diagnosis not present

## 2023-08-21 DIAGNOSIS — R0609 Other forms of dyspnea: Secondary | ICD-10-CM | POA: Diagnosis not present

## 2023-08-21 DIAGNOSIS — J90 Pleural effusion, not elsewhere classified: Secondary | ICD-10-CM | POA: Diagnosis not present

## 2023-08-21 DIAGNOSIS — E871 Hypo-osmolality and hyponatremia: Secondary | ICD-10-CM | POA: Diagnosis not present

## 2023-08-21 DIAGNOSIS — Z1211 Encounter for screening for malignant neoplasm of colon: Secondary | ICD-10-CM | POA: Diagnosis not present

## 2023-08-30 DIAGNOSIS — W1839XD Other fall on same level, subsequent encounter: Secondary | ICD-10-CM | POA: Diagnosis not present

## 2023-08-30 DIAGNOSIS — Z96641 Presence of right artificial hip joint: Secondary | ICD-10-CM | POA: Diagnosis not present

## 2023-08-30 DIAGNOSIS — F17210 Nicotine dependence, cigarettes, uncomplicated: Secondary | ICD-10-CM | POA: Diagnosis not present

## 2023-08-30 DIAGNOSIS — M85851 Other specified disorders of bone density and structure, right thigh: Secondary | ICD-10-CM | POA: Diagnosis not present

## 2023-08-30 DIAGNOSIS — M47816 Spondylosis without myelopathy or radiculopathy, lumbar region: Secondary | ICD-10-CM | POA: Diagnosis not present

## 2023-08-30 DIAGNOSIS — S72041D Displaced fracture of base of neck of right femur, subsequent encounter for closed fracture with routine healing: Secondary | ICD-10-CM | POA: Diagnosis not present

## 2023-09-02 DIAGNOSIS — M810 Age-related osteoporosis without current pathological fracture: Secondary | ICD-10-CM | POA: Diagnosis not present

## 2023-09-02 DIAGNOSIS — R590 Localized enlarged lymph nodes: Secondary | ICD-10-CM | POA: Diagnosis not present

## 2023-09-02 DIAGNOSIS — R0609 Other forms of dyspnea: Secondary | ICD-10-CM | POA: Diagnosis not present

## 2023-09-02 DIAGNOSIS — F17211 Nicotine dependence, cigarettes, in remission: Secondary | ICD-10-CM | POA: Diagnosis not present

## 2023-09-02 DIAGNOSIS — R531 Weakness: Secondary | ICD-10-CM | POA: Diagnosis not present

## 2023-09-02 DIAGNOSIS — F17200 Nicotine dependence, unspecified, uncomplicated: Secondary | ICD-10-CM | POA: Diagnosis not present

## 2023-09-02 DIAGNOSIS — J479 Bronchiectasis, uncomplicated: Secondary | ICD-10-CM | POA: Diagnosis not present

## 2023-09-02 DIAGNOSIS — J90 Pleural effusion, not elsewhere classified: Secondary | ICD-10-CM | POA: Diagnosis not present

## 2023-09-02 DIAGNOSIS — J8410 Pulmonary fibrosis, unspecified: Secondary | ICD-10-CM | POA: Diagnosis not present

## 2023-09-02 DIAGNOSIS — Z79899 Other long term (current) drug therapy: Secondary | ICD-10-CM | POA: Diagnosis not present

## 2023-09-02 DIAGNOSIS — F17210 Nicotine dependence, cigarettes, uncomplicated: Secondary | ICD-10-CM | POA: Diagnosis not present

## 2023-09-02 DIAGNOSIS — Z7982 Long term (current) use of aspirin: Secondary | ICD-10-CM | POA: Diagnosis not present

## 2023-09-02 DIAGNOSIS — M25559 Pain in unspecified hip: Secondary | ICD-10-CM | POA: Diagnosis not present

## 2023-09-02 DIAGNOSIS — G43909 Migraine, unspecified, not intractable, without status migrainosus: Secondary | ICD-10-CM | POA: Diagnosis not present

## 2023-09-13 DIAGNOSIS — M542 Cervicalgia: Secondary | ICD-10-CM | POA: Diagnosis not present

## 2023-09-13 DIAGNOSIS — H9313 Tinnitus, bilateral: Secondary | ICD-10-CM | POA: Diagnosis not present

## 2023-09-13 DIAGNOSIS — M26609 Unspecified temporomandibular joint disorder, unspecified side: Secondary | ICD-10-CM | POA: Diagnosis not present

## 2023-09-20 DIAGNOSIS — M25551 Pain in right hip: Secondary | ICD-10-CM | POA: Diagnosis not present

## 2023-09-20 DIAGNOSIS — R6884 Jaw pain: Secondary | ICD-10-CM | POA: Diagnosis not present

## 2023-09-20 DIAGNOSIS — Z4789 Encounter for other orthopedic aftercare: Secondary | ICD-10-CM | POA: Diagnosis not present

## 2023-09-20 DIAGNOSIS — R262 Difficulty in walking, not elsewhere classified: Secondary | ICD-10-CM | POA: Diagnosis not present

## 2023-09-20 DIAGNOSIS — I639 Cerebral infarction, unspecified: Secondary | ICD-10-CM | POA: Diagnosis not present

## 2023-09-23 DIAGNOSIS — M25551 Pain in right hip: Secondary | ICD-10-CM | POA: Diagnosis not present

## 2023-09-23 DIAGNOSIS — I639 Cerebral infarction, unspecified: Secondary | ICD-10-CM | POA: Diagnosis not present

## 2023-09-23 DIAGNOSIS — R262 Difficulty in walking, not elsewhere classified: Secondary | ICD-10-CM | POA: Diagnosis not present

## 2023-09-23 DIAGNOSIS — R6884 Jaw pain: Secondary | ICD-10-CM | POA: Diagnosis not present

## 2023-09-23 DIAGNOSIS — Z4789 Encounter for other orthopedic aftercare: Secondary | ICD-10-CM | POA: Diagnosis not present

## 2023-09-25 DIAGNOSIS — R6884 Jaw pain: Secondary | ICD-10-CM | POA: Diagnosis not present

## 2023-09-25 DIAGNOSIS — R262 Difficulty in walking, not elsewhere classified: Secondary | ICD-10-CM | POA: Diagnosis not present

## 2023-09-25 DIAGNOSIS — I639 Cerebral infarction, unspecified: Secondary | ICD-10-CM | POA: Diagnosis not present

## 2023-09-25 DIAGNOSIS — Z4789 Encounter for other orthopedic aftercare: Secondary | ICD-10-CM | POA: Diagnosis not present

## 2023-09-25 DIAGNOSIS — M25551 Pain in right hip: Secondary | ICD-10-CM | POA: Diagnosis not present

## 2023-09-30 DIAGNOSIS — M810 Age-related osteoporosis without current pathological fracture: Secondary | ICD-10-CM | POA: Diagnosis not present

## 2023-10-01 DIAGNOSIS — M25551 Pain in right hip: Secondary | ICD-10-CM | POA: Diagnosis not present

## 2023-10-01 DIAGNOSIS — Z4789 Encounter for other orthopedic aftercare: Secondary | ICD-10-CM | POA: Diagnosis not present

## 2023-10-01 DIAGNOSIS — R6884 Jaw pain: Secondary | ICD-10-CM | POA: Diagnosis not present

## 2023-10-01 DIAGNOSIS — I639 Cerebral infarction, unspecified: Secondary | ICD-10-CM | POA: Diagnosis not present

## 2023-10-01 DIAGNOSIS — R262 Difficulty in walking, not elsewhere classified: Secondary | ICD-10-CM | POA: Diagnosis not present

## 2023-10-04 DIAGNOSIS — R6884 Jaw pain: Secondary | ICD-10-CM | POA: Diagnosis not present

## 2023-10-04 DIAGNOSIS — Z23 Encounter for immunization: Secondary | ICD-10-CM | POA: Diagnosis not present

## 2023-10-04 DIAGNOSIS — Z4789 Encounter for other orthopedic aftercare: Secondary | ICD-10-CM | POA: Diagnosis not present

## 2023-10-04 DIAGNOSIS — I639 Cerebral infarction, unspecified: Secondary | ICD-10-CM | POA: Diagnosis not present

## 2023-10-04 DIAGNOSIS — R262 Difficulty in walking, not elsewhere classified: Secondary | ICD-10-CM | POA: Diagnosis not present

## 2023-10-04 DIAGNOSIS — M25551 Pain in right hip: Secondary | ICD-10-CM | POA: Diagnosis not present

## 2023-10-08 DIAGNOSIS — I639 Cerebral infarction, unspecified: Secondary | ICD-10-CM | POA: Diagnosis not present

## 2023-10-08 DIAGNOSIS — Z4789 Encounter for other orthopedic aftercare: Secondary | ICD-10-CM | POA: Diagnosis not present

## 2023-10-08 DIAGNOSIS — M25551 Pain in right hip: Secondary | ICD-10-CM | POA: Diagnosis not present

## 2023-10-08 DIAGNOSIS — R262 Difficulty in walking, not elsewhere classified: Secondary | ICD-10-CM | POA: Diagnosis not present

## 2023-10-08 DIAGNOSIS — R6884 Jaw pain: Secondary | ICD-10-CM | POA: Diagnosis not present

## 2023-10-14 DIAGNOSIS — M47816 Spondylosis without myelopathy or radiculopathy, lumbar region: Secondary | ICD-10-CM | POA: Diagnosis not present

## 2023-10-14 DIAGNOSIS — M810 Age-related osteoporosis without current pathological fracture: Secondary | ICD-10-CM | POA: Diagnosis not present

## 2023-10-14 DIAGNOSIS — M4854XD Collapsed vertebra, not elsewhere classified, thoracic region, subsequent encounter for fracture with routine healing: Secondary | ICD-10-CM | POA: Diagnosis not present

## 2023-10-14 DIAGNOSIS — M4856XD Collapsed vertebra, not elsewhere classified, lumbar region, subsequent encounter for fracture with routine healing: Secondary | ICD-10-CM | POA: Diagnosis not present

## 2023-10-14 DIAGNOSIS — S22000A Wedge compression fracture of unspecified thoracic vertebra, initial encounter for closed fracture: Secondary | ICD-10-CM | POA: Diagnosis not present

## 2023-10-14 DIAGNOSIS — X58XXXA Exposure to other specified factors, initial encounter: Secondary | ICD-10-CM | POA: Diagnosis not present

## 2023-10-14 DIAGNOSIS — S22068A Other fracture of T7-T8 thoracic vertebra, initial encounter for closed fracture: Secondary | ICD-10-CM | POA: Diagnosis not present

## 2023-10-16 DIAGNOSIS — I639 Cerebral infarction, unspecified: Secondary | ICD-10-CM | POA: Diagnosis not present

## 2023-10-16 DIAGNOSIS — R262 Difficulty in walking, not elsewhere classified: Secondary | ICD-10-CM | POA: Diagnosis not present

## 2023-10-16 DIAGNOSIS — M25551 Pain in right hip: Secondary | ICD-10-CM | POA: Diagnosis not present

## 2023-10-16 DIAGNOSIS — Z4789 Encounter for other orthopedic aftercare: Secondary | ICD-10-CM | POA: Diagnosis not present

## 2023-10-16 DIAGNOSIS — R6884 Jaw pain: Secondary | ICD-10-CM | POA: Diagnosis not present

## 2023-10-18 DIAGNOSIS — R6884 Jaw pain: Secondary | ICD-10-CM | POA: Diagnosis not present

## 2023-10-18 DIAGNOSIS — I639 Cerebral infarction, unspecified: Secondary | ICD-10-CM | POA: Diagnosis not present

## 2023-10-18 DIAGNOSIS — M25551 Pain in right hip: Secondary | ICD-10-CM | POA: Diagnosis not present

## 2023-10-18 DIAGNOSIS — R262 Difficulty in walking, not elsewhere classified: Secondary | ICD-10-CM | POA: Diagnosis not present

## 2023-10-18 DIAGNOSIS — Z4789 Encounter for other orthopedic aftercare: Secondary | ICD-10-CM | POA: Diagnosis not present

## 2023-10-22 DIAGNOSIS — R262 Difficulty in walking, not elsewhere classified: Secondary | ICD-10-CM | POA: Diagnosis not present

## 2023-10-22 DIAGNOSIS — R6884 Jaw pain: Secondary | ICD-10-CM | POA: Diagnosis not present

## 2023-10-22 DIAGNOSIS — M25551 Pain in right hip: Secondary | ICD-10-CM | POA: Diagnosis not present

## 2023-10-22 DIAGNOSIS — I639 Cerebral infarction, unspecified: Secondary | ICD-10-CM | POA: Diagnosis not present

## 2023-10-22 DIAGNOSIS — Z4789 Encounter for other orthopedic aftercare: Secondary | ICD-10-CM | POA: Diagnosis not present

## 2023-10-25 DIAGNOSIS — R262 Difficulty in walking, not elsewhere classified: Secondary | ICD-10-CM | POA: Diagnosis not present

## 2023-10-25 DIAGNOSIS — M25551 Pain in right hip: Secondary | ICD-10-CM | POA: Diagnosis not present

## 2023-10-25 DIAGNOSIS — Z4789 Encounter for other orthopedic aftercare: Secondary | ICD-10-CM | POA: Diagnosis not present

## 2023-10-25 DIAGNOSIS — R6884 Jaw pain: Secondary | ICD-10-CM | POA: Diagnosis not present

## 2023-10-25 DIAGNOSIS — I639 Cerebral infarction, unspecified: Secondary | ICD-10-CM | POA: Diagnosis not present

## 2023-11-05 DIAGNOSIS — R262 Difficulty in walking, not elsewhere classified: Secondary | ICD-10-CM | POA: Diagnosis not present

## 2023-11-05 DIAGNOSIS — R6884 Jaw pain: Secondary | ICD-10-CM | POA: Diagnosis not present

## 2023-11-05 DIAGNOSIS — M25551 Pain in right hip: Secondary | ICD-10-CM | POA: Diagnosis not present

## 2023-11-05 DIAGNOSIS — Z4789 Encounter for other orthopedic aftercare: Secondary | ICD-10-CM | POA: Diagnosis not present

## 2023-11-05 DIAGNOSIS — I639 Cerebral infarction, unspecified: Secondary | ICD-10-CM | POA: Diagnosis not present

## 2023-11-15 DIAGNOSIS — R6884 Jaw pain: Secondary | ICD-10-CM | POA: Diagnosis not present

## 2023-11-15 DIAGNOSIS — R262 Difficulty in walking, not elsewhere classified: Secondary | ICD-10-CM | POA: Diagnosis not present

## 2023-11-15 DIAGNOSIS — Z4789 Encounter for other orthopedic aftercare: Secondary | ICD-10-CM | POA: Diagnosis not present

## 2023-11-15 DIAGNOSIS — M25551 Pain in right hip: Secondary | ICD-10-CM | POA: Diagnosis not present

## 2023-11-15 DIAGNOSIS — I639 Cerebral infarction, unspecified: Secondary | ICD-10-CM | POA: Diagnosis not present

## 2023-11-18 DIAGNOSIS — I639 Cerebral infarction, unspecified: Secondary | ICD-10-CM | POA: Diagnosis not present

## 2023-11-18 DIAGNOSIS — M25551 Pain in right hip: Secondary | ICD-10-CM | POA: Diagnosis not present

## 2023-11-18 DIAGNOSIS — R262 Difficulty in walking, not elsewhere classified: Secondary | ICD-10-CM | POA: Diagnosis not present

## 2023-11-18 DIAGNOSIS — R6884 Jaw pain: Secondary | ICD-10-CM | POA: Diagnosis not present

## 2023-11-18 DIAGNOSIS — Z4789 Encounter for other orthopedic aftercare: Secondary | ICD-10-CM | POA: Diagnosis not present

## 2023-11-25 DIAGNOSIS — M533 Sacrococcygeal disorders, not elsewhere classified: Secondary | ICD-10-CM | POA: Diagnosis not present

## 2023-11-25 DIAGNOSIS — Z96651 Presence of right artificial knee joint: Secondary | ICD-10-CM | POA: Diagnosis not present

## 2023-11-25 DIAGNOSIS — M1612 Unilateral primary osteoarthritis, left hip: Secondary | ICD-10-CM | POA: Diagnosis not present

## 2023-11-25 DIAGNOSIS — H5040 Unspecified heterotropia: Secondary | ICD-10-CM | POA: Diagnosis not present

## 2023-11-25 DIAGNOSIS — W1839XD Other fall on same level, subsequent encounter: Secondary | ICD-10-CM | POA: Diagnosis not present

## 2023-11-25 DIAGNOSIS — Z96641 Presence of right artificial hip joint: Secondary | ICD-10-CM | POA: Diagnosis not present

## 2023-11-25 DIAGNOSIS — S72041D Displaced fracture of base of neck of right femur, subsequent encounter for closed fracture with routine healing: Secondary | ICD-10-CM | POA: Diagnosis not present

## 2023-11-29 DIAGNOSIS — I639 Cerebral infarction, unspecified: Secondary | ICD-10-CM | POA: Diagnosis not present

## 2023-11-29 DIAGNOSIS — R262 Difficulty in walking, not elsewhere classified: Secondary | ICD-10-CM | POA: Diagnosis not present

## 2023-11-29 DIAGNOSIS — R6884 Jaw pain: Secondary | ICD-10-CM | POA: Diagnosis not present

## 2023-11-29 DIAGNOSIS — M25551 Pain in right hip: Secondary | ICD-10-CM | POA: Diagnosis not present

## 2023-11-29 DIAGNOSIS — Z4789 Encounter for other orthopedic aftercare: Secondary | ICD-10-CM | POA: Diagnosis not present

## 2023-12-03 DIAGNOSIS — I639 Cerebral infarction, unspecified: Secondary | ICD-10-CM | POA: Diagnosis not present

## 2023-12-03 DIAGNOSIS — M25551 Pain in right hip: Secondary | ICD-10-CM | POA: Diagnosis not present

## 2023-12-03 DIAGNOSIS — R6884 Jaw pain: Secondary | ICD-10-CM | POA: Diagnosis not present

## 2023-12-03 DIAGNOSIS — R262 Difficulty in walking, not elsewhere classified: Secondary | ICD-10-CM | POA: Diagnosis not present

## 2023-12-03 DIAGNOSIS — Z4789 Encounter for other orthopedic aftercare: Secondary | ICD-10-CM | POA: Diagnosis not present

## 2023-12-04 DIAGNOSIS — Z961 Presence of intraocular lens: Secondary | ICD-10-CM | POA: Diagnosis not present

## 2023-12-04 DIAGNOSIS — H2512 Age-related nuclear cataract, left eye: Secondary | ICD-10-CM | POA: Diagnosis not present

## 2023-12-11 DIAGNOSIS — I639 Cerebral infarction, unspecified: Secondary | ICD-10-CM | POA: Diagnosis not present

## 2023-12-11 DIAGNOSIS — Z4789 Encounter for other orthopedic aftercare: Secondary | ICD-10-CM | POA: Diagnosis not present

## 2023-12-11 DIAGNOSIS — R6884 Jaw pain: Secondary | ICD-10-CM | POA: Diagnosis not present

## 2023-12-11 DIAGNOSIS — R262 Difficulty in walking, not elsewhere classified: Secondary | ICD-10-CM | POA: Diagnosis not present

## 2023-12-11 DIAGNOSIS — M25551 Pain in right hip: Secondary | ICD-10-CM | POA: Diagnosis not present

## 2023-12-20 DIAGNOSIS — G43711 Chronic migraine without aura, intractable, with status migrainosus: Secondary | ICD-10-CM | POA: Diagnosis not present

## 2023-12-20 DIAGNOSIS — M542 Cervicalgia: Secondary | ICD-10-CM | POA: Diagnosis not present

## 2023-12-20 DIAGNOSIS — Z8673 Personal history of transient ischemic attack (TIA), and cerebral infarction without residual deficits: Secondary | ICD-10-CM | POA: Diagnosis not present

## 2023-12-20 DIAGNOSIS — G4700 Insomnia, unspecified: Secondary | ICD-10-CM | POA: Diagnosis not present

## 2023-12-20 DIAGNOSIS — F419 Anxiety disorder, unspecified: Secondary | ICD-10-CM | POA: Diagnosis not present

## 2023-12-20 DIAGNOSIS — F32A Depression, unspecified: Secondary | ICD-10-CM | POA: Diagnosis not present

## 2023-12-23 DIAGNOSIS — H9319 Tinnitus, unspecified ear: Secondary | ICD-10-CM | POA: Diagnosis not present

## 2023-12-23 DIAGNOSIS — R040 Epistaxis: Secondary | ICD-10-CM | POA: Diagnosis not present

## 2023-12-26 DIAGNOSIS — G629 Polyneuropathy, unspecified: Secondary | ICD-10-CM | POA: Diagnosis not present

## 2023-12-26 DIAGNOSIS — F17200 Nicotine dependence, unspecified, uncomplicated: Secondary | ICD-10-CM | POA: Diagnosis not present

## 2023-12-26 DIAGNOSIS — R030 Elevated blood-pressure reading, without diagnosis of hypertension: Secondary | ICD-10-CM | POA: Diagnosis not present

## 2023-12-26 DIAGNOSIS — Z86718 Personal history of other venous thrombosis and embolism: Secondary | ICD-10-CM | POA: Diagnosis not present

## 2023-12-26 DIAGNOSIS — M810 Age-related osteoporosis without current pathological fracture: Secondary | ICD-10-CM | POA: Diagnosis not present

## 2023-12-26 DIAGNOSIS — D649 Anemia, unspecified: Secondary | ICD-10-CM | POA: Diagnosis not present

## 2023-12-26 DIAGNOSIS — R600 Localized edema: Secondary | ICD-10-CM | POA: Diagnosis not present

## 2023-12-26 DIAGNOSIS — M50121 Cervical disc disorder at C4-C5 level with radiculopathy: Secondary | ICD-10-CM | POA: Diagnosis not present

## 2023-12-27 DIAGNOSIS — I6521 Occlusion and stenosis of right carotid artery: Secondary | ICD-10-CM | POA: Diagnosis not present

## 2023-12-27 DIAGNOSIS — F17200 Nicotine dependence, unspecified, uncomplicated: Secondary | ICD-10-CM | POA: Diagnosis not present

## 2023-12-27 DIAGNOSIS — E785 Hyperlipidemia, unspecified: Secondary | ICD-10-CM | POA: Diagnosis not present

## 2023-12-27 DIAGNOSIS — Z8673 Personal history of transient ischemic attack (TIA), and cerebral infarction without residual deficits: Secondary | ICD-10-CM | POA: Diagnosis not present

## 2023-12-27 DIAGNOSIS — Z1331 Encounter for screening for depression: Secondary | ICD-10-CM | POA: Diagnosis not present

## 2023-12-30 DIAGNOSIS — R9401 Abnormal electroencephalogram [EEG]: Secondary | ICD-10-CM | POA: Diagnosis not present

## 2023-12-30 DIAGNOSIS — J9622 Acute and chronic respiratory failure with hypercapnia: Secondary | ICD-10-CM | POA: Diagnosis not present

## 2023-12-30 DIAGNOSIS — I6521 Occlusion and stenosis of right carotid artery: Secondary | ICD-10-CM | POA: Diagnosis not present

## 2023-12-30 DIAGNOSIS — F32A Depression, unspecified: Secondary | ICD-10-CM | POA: Diagnosis not present

## 2023-12-30 DIAGNOSIS — J9602 Acute respiratory failure with hypercapnia: Secondary | ICD-10-CM | POA: Diagnosis not present

## 2023-12-30 DIAGNOSIS — Z0389 Encounter for observation for other suspected diseases and conditions ruled out: Secondary | ICD-10-CM | POA: Diagnosis not present

## 2023-12-30 DIAGNOSIS — G9341 Metabolic encephalopathy: Secondary | ICD-10-CM | POA: Diagnosis not present

## 2023-12-30 DIAGNOSIS — G9389 Other specified disorders of brain: Secondary | ICD-10-CM | POA: Diagnosis not present

## 2023-12-30 DIAGNOSIS — R928 Other abnormal and inconclusive findings on diagnostic imaging of breast: Secondary | ICD-10-CM | POA: Diagnosis not present

## 2023-12-30 DIAGNOSIS — M7989 Other specified soft tissue disorders: Secondary | ICD-10-CM | POA: Diagnosis not present

## 2023-12-30 DIAGNOSIS — R590 Localized enlarged lymph nodes: Secondary | ICD-10-CM | POA: Diagnosis not present

## 2023-12-30 DIAGNOSIS — R531 Weakness: Secondary | ICD-10-CM | POA: Diagnosis not present

## 2023-12-30 DIAGNOSIS — E785 Hyperlipidemia, unspecified: Secondary | ICD-10-CM | POA: Diagnosis not present

## 2023-12-30 DIAGNOSIS — Z452 Encounter for adjustment and management of vascular access device: Secondary | ICD-10-CM | POA: Diagnosis not present

## 2023-12-30 DIAGNOSIS — Z8673 Personal history of transient ischemic attack (TIA), and cerebral infarction without residual deficits: Secondary | ICD-10-CM | POA: Diagnosis not present

## 2023-12-30 DIAGNOSIS — I2489 Other forms of acute ischemic heart disease: Secondary | ICD-10-CM | POA: Diagnosis not present

## 2023-12-30 DIAGNOSIS — J449 Chronic obstructive pulmonary disease, unspecified: Secondary | ICD-10-CM | POA: Diagnosis not present

## 2023-12-30 DIAGNOSIS — D72810 Lymphocytopenia: Secondary | ICD-10-CM | POA: Diagnosis not present

## 2023-12-30 DIAGNOSIS — F17290 Nicotine dependence, other tobacco product, uncomplicated: Secondary | ICD-10-CM | POA: Diagnosis not present

## 2023-12-30 DIAGNOSIS — D72828 Other elevated white blood cell count: Secondary | ICD-10-CM | POA: Diagnosis not present

## 2023-12-30 DIAGNOSIS — J9601 Acute respiratory failure with hypoxia: Secondary | ICD-10-CM | POA: Diagnosis not present

## 2023-12-30 DIAGNOSIS — R0902 Hypoxemia: Secondary | ICD-10-CM | POA: Diagnosis not present

## 2023-12-30 DIAGNOSIS — G629 Polyneuropathy, unspecified: Secondary | ICD-10-CM | POA: Diagnosis not present

## 2023-12-30 DIAGNOSIS — J90 Pleural effusion, not elsewhere classified: Secondary | ICD-10-CM | POA: Diagnosis not present

## 2023-12-30 DIAGNOSIS — R569 Unspecified convulsions: Secondary | ICD-10-CM | POA: Diagnosis not present

## 2023-12-30 DIAGNOSIS — Z9911 Dependence on respirator [ventilator] status: Secondary | ICD-10-CM | POA: Diagnosis not present

## 2023-12-30 DIAGNOSIS — D72818 Other decreased white blood cell count: Secondary | ICD-10-CM | POA: Diagnosis not present

## 2023-12-30 DIAGNOSIS — R52 Pain, unspecified: Secondary | ICD-10-CM | POA: Diagnosis not present

## 2023-12-30 DIAGNOSIS — R918 Other nonspecific abnormal finding of lung field: Secondary | ICD-10-CM | POA: Diagnosis not present

## 2023-12-30 DIAGNOSIS — J8410 Pulmonary fibrosis, unspecified: Secondary | ICD-10-CM | POA: Diagnosis not present

## 2023-12-30 DIAGNOSIS — G9340 Encephalopathy, unspecified: Secondary | ICD-10-CM | POA: Diagnosis not present

## 2023-12-30 DIAGNOSIS — E440 Moderate protein-calorie malnutrition: Secondary | ICD-10-CM | POA: Diagnosis not present

## 2023-12-30 DIAGNOSIS — Z1152 Encounter for screening for COVID-19: Secondary | ICD-10-CM | POA: Diagnosis not present

## 2023-12-30 DIAGNOSIS — I69328 Other speech and language deficits following cerebral infarction: Secondary | ICD-10-CM | POA: Diagnosis not present

## 2023-12-30 DIAGNOSIS — G43909 Migraine, unspecified, not intractable, without status migrainosus: Secondary | ICD-10-CM | POA: Diagnosis not present

## 2023-12-30 DIAGNOSIS — L539 Erythematous condition, unspecified: Secondary | ICD-10-CM | POA: Diagnosis not present

## 2023-12-30 DIAGNOSIS — R29898 Other symptoms and signs involving the musculoskeletal system: Secondary | ICD-10-CM | POA: Diagnosis not present

## 2023-12-30 DIAGNOSIS — R627 Adult failure to thrive: Secondary | ICD-10-CM | POA: Diagnosis not present

## 2023-12-30 DIAGNOSIS — R471 Dysarthria and anarthria: Secondary | ICD-10-CM | POA: Diagnosis not present

## 2023-12-30 DIAGNOSIS — J984 Other disorders of lung: Secondary | ICD-10-CM | POA: Diagnosis not present

## 2023-12-30 DIAGNOSIS — Z7982 Long term (current) use of aspirin: Secondary | ICD-10-CM | POA: Diagnosis not present

## 2023-12-30 DIAGNOSIS — R4182 Altered mental status, unspecified: Secondary | ICD-10-CM | POA: Diagnosis not present

## 2023-12-30 DIAGNOSIS — F17210 Nicotine dependence, cigarettes, uncomplicated: Secondary | ICD-10-CM | POA: Diagnosis not present

## 2023-12-30 DIAGNOSIS — E871 Hypo-osmolality and hyponatremia: Secondary | ICD-10-CM | POA: Diagnosis not present

## 2023-12-30 DIAGNOSIS — I69354 Hemiplegia and hemiparesis following cerebral infarction affecting left non-dominant side: Secondary | ICD-10-CM | POA: Diagnosis not present

## 2023-12-30 DIAGNOSIS — I071 Rheumatic tricuspid insufficiency: Secondary | ICD-10-CM | POA: Diagnosis not present

## 2023-12-30 DIAGNOSIS — K6389 Other specified diseases of intestine: Secondary | ICD-10-CM | POA: Diagnosis not present

## 2023-12-30 DIAGNOSIS — Z4682 Encounter for fitting and adjustment of non-vascular catheter: Secondary | ICD-10-CM | POA: Diagnosis not present

## 2023-12-30 DIAGNOSIS — J9610 Chronic respiratory failure, unspecified whether with hypoxia or hypercapnia: Secondary | ICD-10-CM | POA: Diagnosis not present

## 2023-12-30 DIAGNOSIS — R0602 Shortness of breath: Secondary | ICD-10-CM | POA: Diagnosis not present

## 2023-12-30 DIAGNOSIS — R718 Other abnormality of red blood cells: Secondary | ICD-10-CM | POA: Diagnosis not present

## 2023-12-30 DIAGNOSIS — J811 Chronic pulmonary edema: Secondary | ICD-10-CM | POA: Diagnosis not present

## 2023-12-30 DIAGNOSIS — Z681 Body mass index (BMI) 19 or less, adult: Secondary | ICD-10-CM | POA: Diagnosis not present

## 2023-12-30 DIAGNOSIS — J9621 Acute and chronic respiratory failure with hypoxia: Secondary | ICD-10-CM | POA: Diagnosis not present

## 2023-12-31 DIAGNOSIS — R4182 Altered mental status, unspecified: Secondary | ICD-10-CM | POA: Diagnosis not present

## 2024-01-02 DIAGNOSIS — R9401 Abnormal electroencephalogram [EEG]: Secondary | ICD-10-CM | POA: Diagnosis not present

## 2024-01-02 DIAGNOSIS — R4182 Altered mental status, unspecified: Secondary | ICD-10-CM | POA: Diagnosis not present

## 2024-01-02 DIAGNOSIS — G9340 Encephalopathy, unspecified: Secondary | ICD-10-CM | POA: Diagnosis not present

## 2024-01-08 ENCOUNTER — Telehealth: Payer: Self-pay | Admitting: *Deleted

## 2024-01-08 NOTE — Transitions of Care (Post Inpatient/ED Visit) (Signed)
 01/08/2024  Name: Carolyn Dunlap MRN: [id number] DOB: 02-17-1951  Today's TOC FU Call Status: Today's TOC FU Call Status:: Successful TOC FU Call Completed TOC FU Call Complete Date: 01/08/24 Patient's Name and Date of Birth confirmed.  Transition Care Management Follow-up Telephone Call Date of Discharge: 01/07/24 Discharge Facility: Other [facility]) Name of Other ([facility]) Discharge Facility: [facility] Type of Discharge: Inpatient Admission Primary Inpatient Discharge Diagnosis:: Acute Respiratory Failure with hypoxia and hypercapnea, requiring intubation How have you been since you were released from the hospital?: Better (I am much better now.  I am no longer going to [facility]- I switched PCP to [facility] and have been going to the new PCP for about a year  Remainder of TOC call deferred accordingly)  01/08/24: confirmed no longer has [facility] PCP- Confirmed currently has established PCP with [facility] and does not need resources for new PCP; Full TOC call deferred accordingly   Items Reviewed:    Medications Reviewed Today: Medications Reviewed Today     Reviewed by Jonothan Heberle M, RN (Registered Nurse) on 01/08/24 at 1200  Med List Status: <None>   Medication Order Taking? Sig Documenting Provider Last Dose Status Informant  albuterol  (VENTOLIN  HFA) 108 (90 Base) MCG/ACT inhaler [id number] No Inhale 2 puffs into the lungs every 6 (six) hours as needed for wheezing or shortness of breath. Geronimo Amel, MD Taking Active   atorvastatin (LIPITOR) 40 MG tablet [id number] No Take 40 mg by mouth at bedtime. [provider] Taking Active   Calcium-Magnesium-Vitamin D (CALCIUM 500 PO) [id number] No Take 1 tablet by mouth daily. [provider] Taking Active   ferrous sulfate 325 (65 FE) MG tablet [id number] No Take 325 mg by mouth daily with breakfast. [provider] Taking Active   furosemide  (LASIX ) 20 MG tablet [id number]  Take 1  tablet (20 mg total) by mouth daily as needed for edema. Justus Leita DEL, MD  Active   ipratropium-albuterol  (DUONEB) 0.5-2.5 (3) MG/3ML SOLN [id number]  INHALE 3 ML BY NEBULIZER EVERY 6 HOURS AS NEEDED Justus Leita DEL, MD  Active   magnesium oxide (MAG-OX) 400 (240 Mg) MG tablet [id number] No Take 400 mg by mouth daily. [provider] Taking Active   mirtazapine (REMERON) 45 MG tablet [id number] No Take 45 mg by mouth at bedtime. [provider] Taking Active   Multiple Vitamin (MULTI-DAY PO) [id number] No Take 1 tablet by mouth daily. [provider] Taking Active   polyethylene glycol (MIRALAX / GLYCOLAX) 17 g packet [id number] No Take 17 g by mouth daily as needed. [provider] Taking Active   senna-docusate (SENOKOT-S) 8.6-50 MG tablet [id number] No Take 1 tablet by mouth 2 (two) times daily. [provider] Taking Active   SUMAtriptan  (IMITREX ) 100 MG tablet [id number] No Take 1 tablet (100 mg total) by mouth as needed. May repeat in 2 hours if headache persists or recurs. Justus Leita DEL, MD Taking Active   Tiotropium Bromide Monohydrate  (SPIRIVA  RESPIMAT) 1.25 MCG/ACT AERS [id number] No Inhale 2 puffs into the lungs daily. Geronimo Amel, MD Taking Active   traZODone (DESYREL) 100 MG tablet [id number] No Take 100 mg by mouth at bedtime. [provider] Taking Active   Venlafaxine HCl 225 MG TB24 [id number] No Take 1 tablet by mouth every morning. [provider] Taking Active   zolpidem  (AMBIEN ) 10 MG tablet [id number] No Take 10 mg by mouth at bedtime as needed. [provider] Taking Active  Home Care and Equipment/Supplies:    Functional Questionnaire:    Follow up appointments reviewed:     Pls call/ message for questions,  Jahel Wavra Mckinney Ameris Akamine, RN, BSN, CCRN Alumnus RN Care Manager  Transitions of Care  [facility] [phone]: direct office

## 2024-01-09 DIAGNOSIS — R630 Anorexia: Secondary | ICD-10-CM | POA: Diagnosis not present

## 2024-01-09 DIAGNOSIS — I509 Heart failure, unspecified: Secondary | ICD-10-CM | POA: Diagnosis not present

## 2024-01-09 DIAGNOSIS — Z8673 Personal history of transient ischemic attack (TIA), and cerebral infarction without residual deficits: Secondary | ICD-10-CM | POA: Diagnosis not present

## 2024-01-09 DIAGNOSIS — J8410 Pulmonary fibrosis, unspecified: Secondary | ICD-10-CM | POA: Diagnosis not present

## 2024-01-09 DIAGNOSIS — I959 Hypotension, unspecified: Secondary | ICD-10-CM | POA: Diagnosis not present

## 2024-01-09 DIAGNOSIS — G8929 Other chronic pain: Secondary | ICD-10-CM | POA: Diagnosis not present

## 2024-01-09 DIAGNOSIS — J9602 Acute respiratory failure with hypercapnia: Secondary | ICD-10-CM | POA: Diagnosis not present

## 2024-01-09 DIAGNOSIS — E46 Unspecified protein-calorie malnutrition: Secondary | ICD-10-CM | POA: Diagnosis not present

## 2024-01-09 DIAGNOSIS — G43909 Migraine, unspecified, not intractable, without status migrainosus: Secondary | ICD-10-CM | POA: Diagnosis not present

## 2024-01-09 DIAGNOSIS — E785 Hyperlipidemia, unspecified: Secondary | ICD-10-CM | POA: Diagnosis not present

## 2024-01-09 DIAGNOSIS — J90 Pleural effusion, not elsewhere classified: Secondary | ICD-10-CM | POA: Diagnosis not present

## 2024-01-09 DIAGNOSIS — G629 Polyneuropathy, unspecified: Secondary | ICD-10-CM | POA: Diagnosis not present

## 2024-01-09 DIAGNOSIS — D649 Anemia, unspecified: Secondary | ICD-10-CM | POA: Diagnosis not present

## 2024-01-09 DIAGNOSIS — Z7982 Long term (current) use of aspirin: Secondary | ICD-10-CM | POA: Diagnosis not present

## 2024-01-09 DIAGNOSIS — J449 Chronic obstructive pulmonary disease, unspecified: Secondary | ICD-10-CM | POA: Diagnosis not present

## 2024-01-09 DIAGNOSIS — Z79899 Other long term (current) drug therapy: Secondary | ICD-10-CM | POA: Diagnosis not present

## 2024-01-09 DIAGNOSIS — R531 Weakness: Secondary | ICD-10-CM | POA: Diagnosis not present

## 2024-01-09 DIAGNOSIS — J439 Emphysema, unspecified: Secondary | ICD-10-CM | POA: Diagnosis not present

## 2024-01-09 DIAGNOSIS — Z9981 Dependence on supplemental oxygen: Secondary | ICD-10-CM | POA: Diagnosis not present

## 2024-01-09 DIAGNOSIS — J479 Bronchiectasis, uncomplicated: Secondary | ICD-10-CM | POA: Diagnosis not present

## 2024-01-09 DIAGNOSIS — R471 Dysarthria and anarthria: Secondary | ICD-10-CM | POA: Diagnosis not present

## 2024-01-09 DIAGNOSIS — F17210 Nicotine dependence, cigarettes, uncomplicated: Secondary | ICD-10-CM | POA: Diagnosis not present

## 2024-01-09 DIAGNOSIS — J811 Chronic pulmonary edema: Secondary | ICD-10-CM | POA: Diagnosis not present

## 2024-01-09 DIAGNOSIS — J9601 Acute respiratory failure with hypoxia: Secondary | ICD-10-CM | POA: Diagnosis not present

## 2024-01-09 DIAGNOSIS — R9431 Abnormal electrocardiogram [ECG] [EKG]: Secondary | ICD-10-CM | POA: Diagnosis not present

## 2024-01-09 DIAGNOSIS — M542 Cervicalgia: Secondary | ICD-10-CM | POA: Diagnosis not present

## 2024-01-09 DIAGNOSIS — M810 Age-related osteoporosis without current pathological fracture: Secondary | ICD-10-CM | POA: Diagnosis not present

## 2024-01-09 DIAGNOSIS — R0902 Hypoxemia: Secondary | ICD-10-CM | POA: Diagnosis not present

## 2024-01-09 DIAGNOSIS — F32A Depression, unspecified: Secondary | ICD-10-CM | POA: Diagnosis not present

## 2024-01-09 DIAGNOSIS — J9811 Atelectasis: Secondary | ICD-10-CM | POA: Diagnosis not present

## 2024-01-09 DIAGNOSIS — E871 Hypo-osmolality and hyponatremia: Secondary | ICD-10-CM | POA: Diagnosis not present

## 2024-01-13 ENCOUNTER — Telehealth: Payer: Self-pay | Admitting: Physician Assistant

## 2024-01-13 NOTE — Telephone Encounter (Signed)
 As provider on call, I received several calls Saturday morning from 5:30-6 a.m. When I returned the call around 8 am, I was informed this patient was deceased but I have no additional information. The sergeant was supposed to call me but I never heard back.  As far as I can tell, this is not a patient of [facility]. Looks like she has been receiving care from [facility] with [facility]

## 2024-01-14 ENCOUNTER — Telehealth: Payer: Self-pay

## 2024-01-14 NOTE — Telephone Encounter (Signed)
 Called Rock let her her know that she was no longer our patient and was seeing a provider at [facility] in [place].  KP

## 2024-01-14 NOTE — Telephone Encounter (Signed)
 Copied from CRM #[id number]. Topic: General - Deceased Patient >> 2024-02-11 10:06 AM Drema MATSU wrote: Name of caller: [facility]   Date of death: 02/08/24   Name of funeral home: [facility]   Phone number of funeral home: [phone]   Provider that needs to sign form: death certificate and NCAVE  Timeline for signing: 5 days but needs it asap  case number [id number]

## 2024-02-10 DEATH — deceased

## 2024-02-18 DIAGNOSIS — R471 Dysarthria and anarthria: Secondary | ICD-10-CM | POA: Diagnosis not present

## 2024-02-23 DIAGNOSIS — R471 Dysarthria and anarthria: Secondary | ICD-10-CM | POA: Diagnosis not present
